# Patient Record
Sex: Male | Born: 1955
Health system: Southern US, Community
[De-identification: ages and names within clinical notes are randomized; demographics above are authoritative.]

## PROBLEM LIST (undated history)

## (undated) ENCOUNTER — Ambulatory Visit: Admission: EM | Payer: Medicare HMO | Source: Home / Self Care

## (undated) DIAGNOSIS — E119 Type 2 diabetes mellitus without complications: Secondary | ICD-10-CM

## (undated) DIAGNOSIS — Z860100 Personal history of colon polyps, unspecified: Secondary | ICD-10-CM

## (undated) DIAGNOSIS — J309 Allergic rhinitis, unspecified: Secondary | ICD-10-CM

## (undated) DIAGNOSIS — E785 Hyperlipidemia, unspecified: Secondary | ICD-10-CM

## (undated) DIAGNOSIS — G4730 Sleep apnea, unspecified: Secondary | ICD-10-CM

## (undated) DIAGNOSIS — I10 Essential (primary) hypertension: Secondary | ICD-10-CM

## (undated) DIAGNOSIS — Z87442 Personal history of urinary calculi: Secondary | ICD-10-CM

## (undated) DIAGNOSIS — S42409A Unspecified fracture of lower end of unspecified humerus, initial encounter for closed fracture: Secondary | ICD-10-CM

## (undated) HISTORY — DX: Personal history of urinary calculi: Z87.442

## (undated) HISTORY — DX: Unspecified fracture of lower end of unspecified humerus, initial encounter for closed fracture: S42.409A

## (undated) HISTORY — DX: Allergic rhinitis, unspecified: J30.9

## (undated) HISTORY — DX: Type 2 diabetes mellitus without complications: E11.9

## (undated) HISTORY — PX: OTHER SURGICAL HISTORY: SHX169

## (undated) HISTORY — PX: TONSILLECTOMY AND ADENOIDECTOMY: SUR1326

## (undated) HISTORY — PX: KIDNEY STONE SURGERY: SHX686

## (undated) HISTORY — DX: Hyperlipidemia, unspecified: E78.5

## (undated) HISTORY — DX: Essential (primary) hypertension: I10

## (undated) HISTORY — PX: CHOLECYSTECTOMY: SHX55

## (undated) HISTORY — DX: Personal history of colon polyps, unspecified: Z86.0100

## (undated) HISTORY — PX: FRACTURE SURGERY: SHX138

## (undated) HISTORY — DX: Personal history of colonic polyps: Z86.010

---

## 2000-02-22 ENCOUNTER — Encounter: Admission: RE | Admit: 2000-02-22 | Discharge: 2000-02-22 | Payer: Self-pay | Admitting: Family Medicine

## 2000-02-22 ENCOUNTER — Encounter: Payer: Self-pay | Admitting: Family Medicine

## 2000-04-30 ENCOUNTER — Ambulatory Visit (HOSPITAL_COMMUNITY): Admission: RE | Admit: 2000-04-30 | Discharge: 2000-04-30 | Payer: Self-pay | Admitting: Internal Medicine

## 2000-04-30 ENCOUNTER — Encounter: Payer: Self-pay | Admitting: Internal Medicine

## 2000-06-10 ENCOUNTER — Ambulatory Visit (HOSPITAL_BASED_OUTPATIENT_CLINIC_OR_DEPARTMENT_OTHER): Admission: RE | Admit: 2000-06-10 | Discharge: 2000-06-10 | Payer: Self-pay | Admitting: Family Medicine

## 2000-06-10 ENCOUNTER — Encounter: Payer: Self-pay | Admitting: Pulmonary Disease

## 2002-02-22 ENCOUNTER — Encounter: Payer: Self-pay | Admitting: Pulmonary Disease

## 2002-02-22 ENCOUNTER — Ambulatory Visit (HOSPITAL_BASED_OUTPATIENT_CLINIC_OR_DEPARTMENT_OTHER): Admission: RE | Admit: 2002-02-22 | Discharge: 2002-02-22 | Payer: Self-pay | Admitting: Surgical Oncology

## 2002-03-29 ENCOUNTER — Encounter: Payer: Self-pay | Admitting: Pulmonary Disease

## 2005-07-10 ENCOUNTER — Ambulatory Visit: Payer: Self-pay | Admitting: Family Medicine

## 2005-08-14 ENCOUNTER — Ambulatory Visit: Payer: Self-pay | Admitting: Family Medicine

## 2005-09-24 ENCOUNTER — Ambulatory Visit: Payer: Self-pay | Admitting: Family Medicine

## 2005-10-22 ENCOUNTER — Ambulatory Visit: Payer: Self-pay | Admitting: Pulmonary Disease

## 2006-07-23 ENCOUNTER — Ambulatory Visit: Payer: Self-pay | Admitting: Family Medicine

## 2006-10-04 ENCOUNTER — Emergency Department (HOSPITAL_COMMUNITY): Admission: EM | Admit: 2006-10-04 | Discharge: 2006-10-04 | Payer: Self-pay | Admitting: Emergency Medicine

## 2006-12-22 ENCOUNTER — Ambulatory Visit: Payer: Self-pay | Admitting: Family Medicine

## 2006-12-22 LAB — CONVERTED CEMR LAB
AST: 23 units/L (ref 0–37)
Bilirubin, Direct: 0.1 mg/dL (ref 0.0–0.3)
Direct LDL: 178.7 mg/dL
Eosinophils Relative: 4.9 % (ref 0.0–5.0)
HDL: 37.9 mg/dL — ABNORMAL LOW (ref >39.0)
Hemoglobin: 15.3 g/dL (ref 13.0–17.0)
Lymphocytes Relative: 31.9 % (ref 12.0–46.0)
MCHC: 34.6 g/dL (ref 30.0–36.0)
Neutro Abs: 3.6 10*3/uL (ref 1.4–7.7)
PSA: 1.61 ng/mL
PSA: 1.61 ng/mL (ref 0.10–4.00)
Platelets: 266 10*3/uL (ref 150–400)
Total CHOL/HDL Ratio: 6.3
Total Protein: 7 g/dL (ref 6.0–8.3)
Triglycerides: 181 mg/dL — ABNORMAL HIGH (ref 0–149)

## 2007-01-20 ENCOUNTER — Ambulatory Visit: Payer: Self-pay | Admitting: Internal Medicine

## 2007-02-02 ENCOUNTER — Encounter: Payer: Self-pay | Admitting: Internal Medicine

## 2007-02-02 ENCOUNTER — Ambulatory Visit: Payer: Self-pay | Admitting: Internal Medicine

## 2007-02-02 LAB — HM COLONOSCOPY

## 2007-03-11 ENCOUNTER — Encounter: Payer: Self-pay | Admitting: Family Medicine

## 2007-03-11 DIAGNOSIS — J309 Allergic rhinitis, unspecified: Secondary | ICD-10-CM | POA: Insufficient documentation

## 2007-03-11 DIAGNOSIS — I10 Essential (primary) hypertension: Secondary | ICD-10-CM

## 2007-03-11 DIAGNOSIS — E782 Mixed hyperlipidemia: Secondary | ICD-10-CM

## 2007-03-11 DIAGNOSIS — G4730 Sleep apnea, unspecified: Secondary | ICD-10-CM | POA: Insufficient documentation

## 2007-03-11 DIAGNOSIS — J45909 Unspecified asthma, uncomplicated: Secondary | ICD-10-CM | POA: Insufficient documentation

## 2007-03-23 ENCOUNTER — Encounter (INDEPENDENT_AMBULATORY_CARE_PROVIDER_SITE_OTHER): Payer: Self-pay | Admitting: *Deleted

## 2007-05-04 ENCOUNTER — Ambulatory Visit: Payer: Self-pay | Admitting: Family Medicine

## 2007-05-05 LAB — CONVERTED CEMR LAB
AST: 34 units/L (ref 0–37)
Albumin: 4 g/dL (ref 3.5–5.2)
BUN: 9 mg/dL (ref 6–23)
CO2: 31 meq/L (ref 19–32)
Calcium: 9 mg/dL (ref 8.4–10.5)
Cholesterol: 178 mg/dL (ref 0–200)
HDL: 32.3 mg/dL — ABNORMAL LOW (ref >39.0)
LDL Cholesterol: 123 mg/dL — ABNORMAL HIGH (ref 0–99)
Potassium: 3.7 meq/L (ref 3.5–5.1)
Total CHOL/HDL Ratio: 5.5
Triglycerides: 115 mg/dL (ref 0–149)

## 2007-05-22 ENCOUNTER — Ambulatory Visit: Payer: Self-pay | Admitting: Pulmonary Disease

## 2007-06-07 ENCOUNTER — Emergency Department (HOSPITAL_COMMUNITY): Admission: EM | Admit: 2007-06-07 | Discharge: 2007-06-07 | Payer: Self-pay | Admitting: Emergency Medicine

## 2007-06-20 ENCOUNTER — Emergency Department (HOSPITAL_COMMUNITY): Admission: EM | Admit: 2007-06-20 | Discharge: 2007-06-20 | Payer: Self-pay | Admitting: Emergency Medicine

## 2007-06-23 ENCOUNTER — Ambulatory Visit: Payer: Self-pay | Admitting: Family Medicine

## 2007-06-26 ENCOUNTER — Encounter: Admission: RE | Admit: 2007-06-26 | Discharge: 2007-06-26 | Payer: Self-pay | Admitting: Family Medicine

## 2007-07-16 ENCOUNTER — Encounter: Payer: Self-pay | Admitting: Family Medicine

## 2007-07-24 ENCOUNTER — Observation Stay (HOSPITAL_COMMUNITY): Admission: EM | Admit: 2007-07-24 | Discharge: 2007-07-25 | Payer: Self-pay | Admitting: Emergency Medicine

## 2007-07-24 ENCOUNTER — Encounter (INDEPENDENT_AMBULATORY_CARE_PROVIDER_SITE_OTHER): Payer: Self-pay | Admitting: General Surgery

## 2007-07-29 ENCOUNTER — Encounter: Payer: Self-pay | Admitting: Family Medicine

## 2008-02-02 ENCOUNTER — Encounter: Payer: Self-pay | Admitting: Internal Medicine

## 2008-05-16 ENCOUNTER — Ambulatory Visit: Payer: Self-pay | Admitting: Family Medicine

## 2008-05-16 ENCOUNTER — Telehealth: Payer: Self-pay | Admitting: Internal Medicine

## 2008-05-17 ENCOUNTER — Ambulatory Visit: Payer: Self-pay | Admitting: Gastroenterology

## 2008-06-21 ENCOUNTER — Ambulatory Visit: Payer: Self-pay | Admitting: Family Medicine

## 2008-06-26 IMAGING — CR DG ABDOMEN ACUTE W/ 1V CHEST
3 series · 3 of 3 positions shown · non-contrast
Comparison: none

CLINICAL DATA: Abdominal  pain.
 ACUTE ABDOMINAL SERIES - 3 VIEW:

[w chest pa]
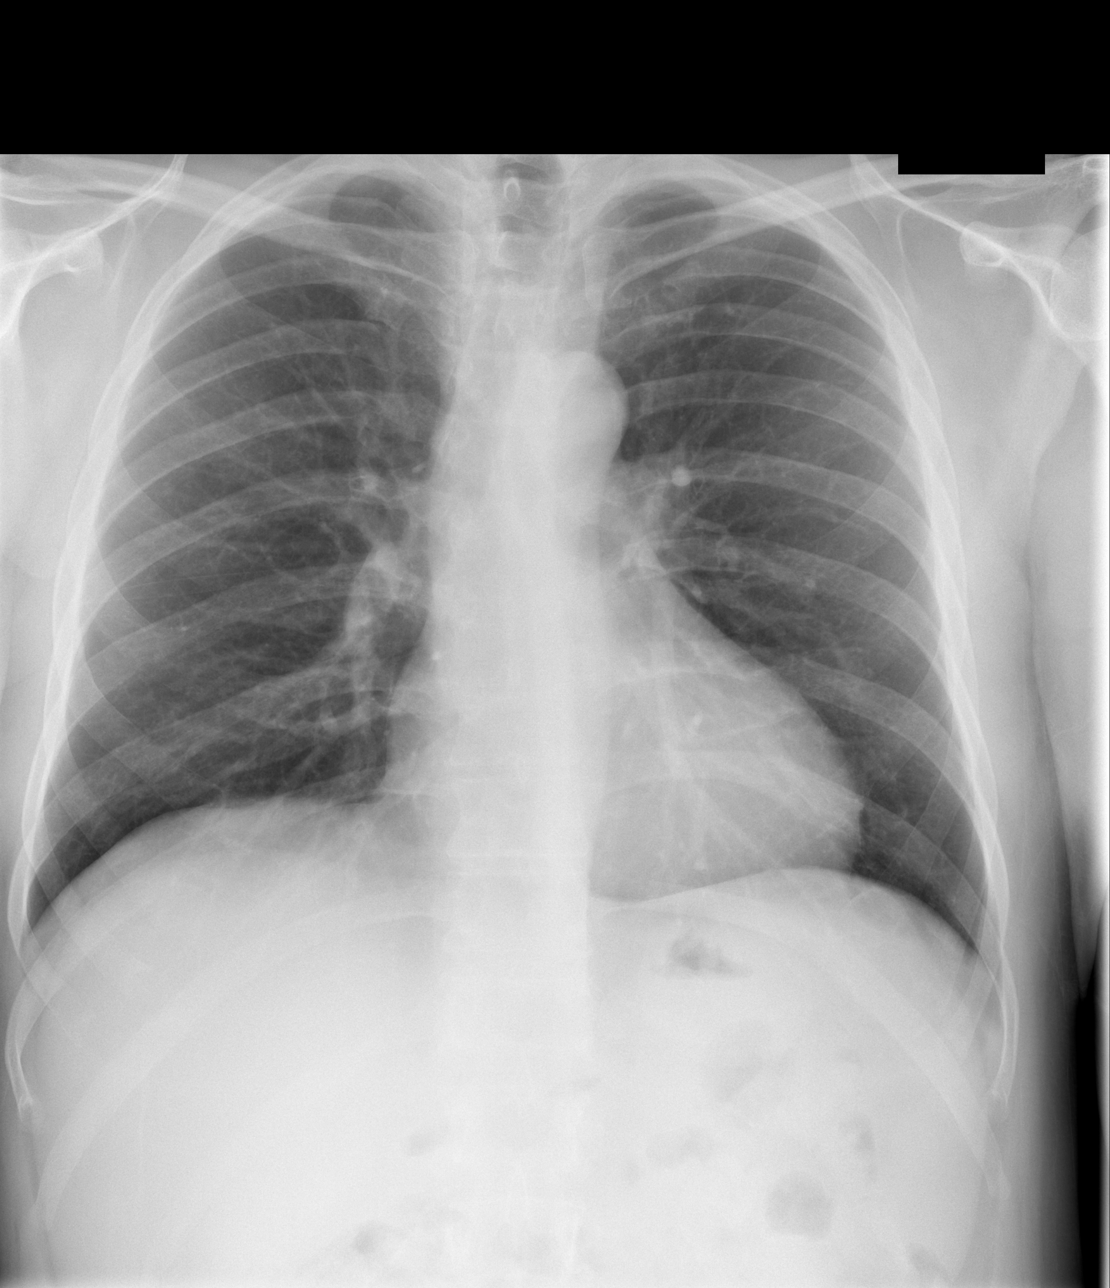

[w abdomen upright]
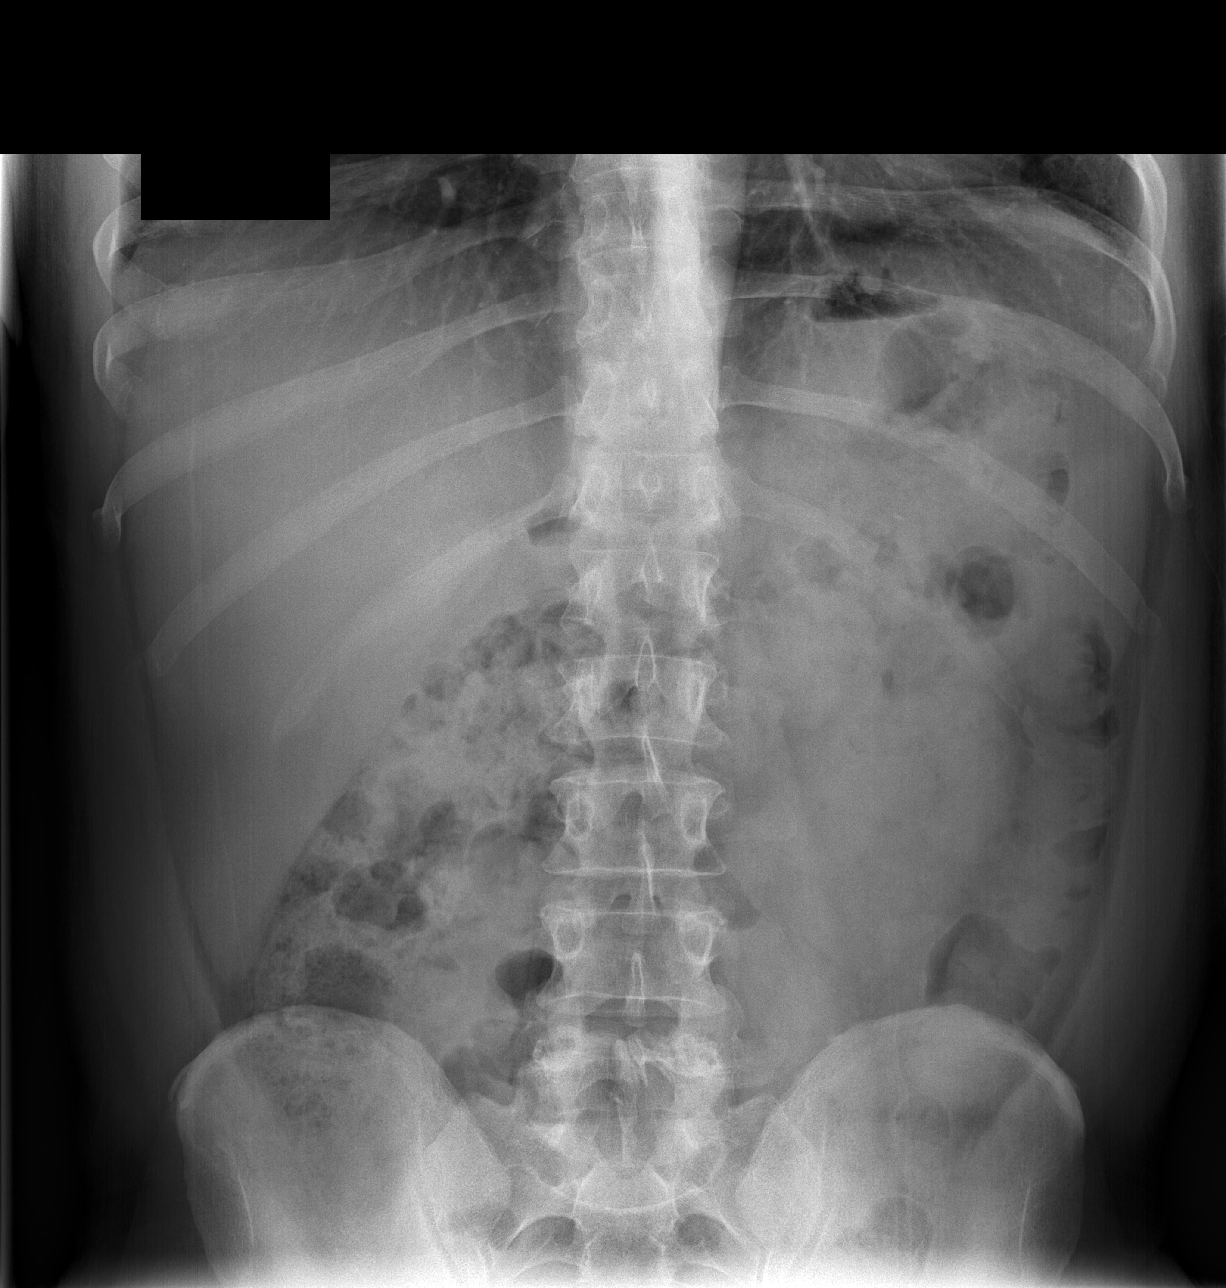

[t abdomen supine]
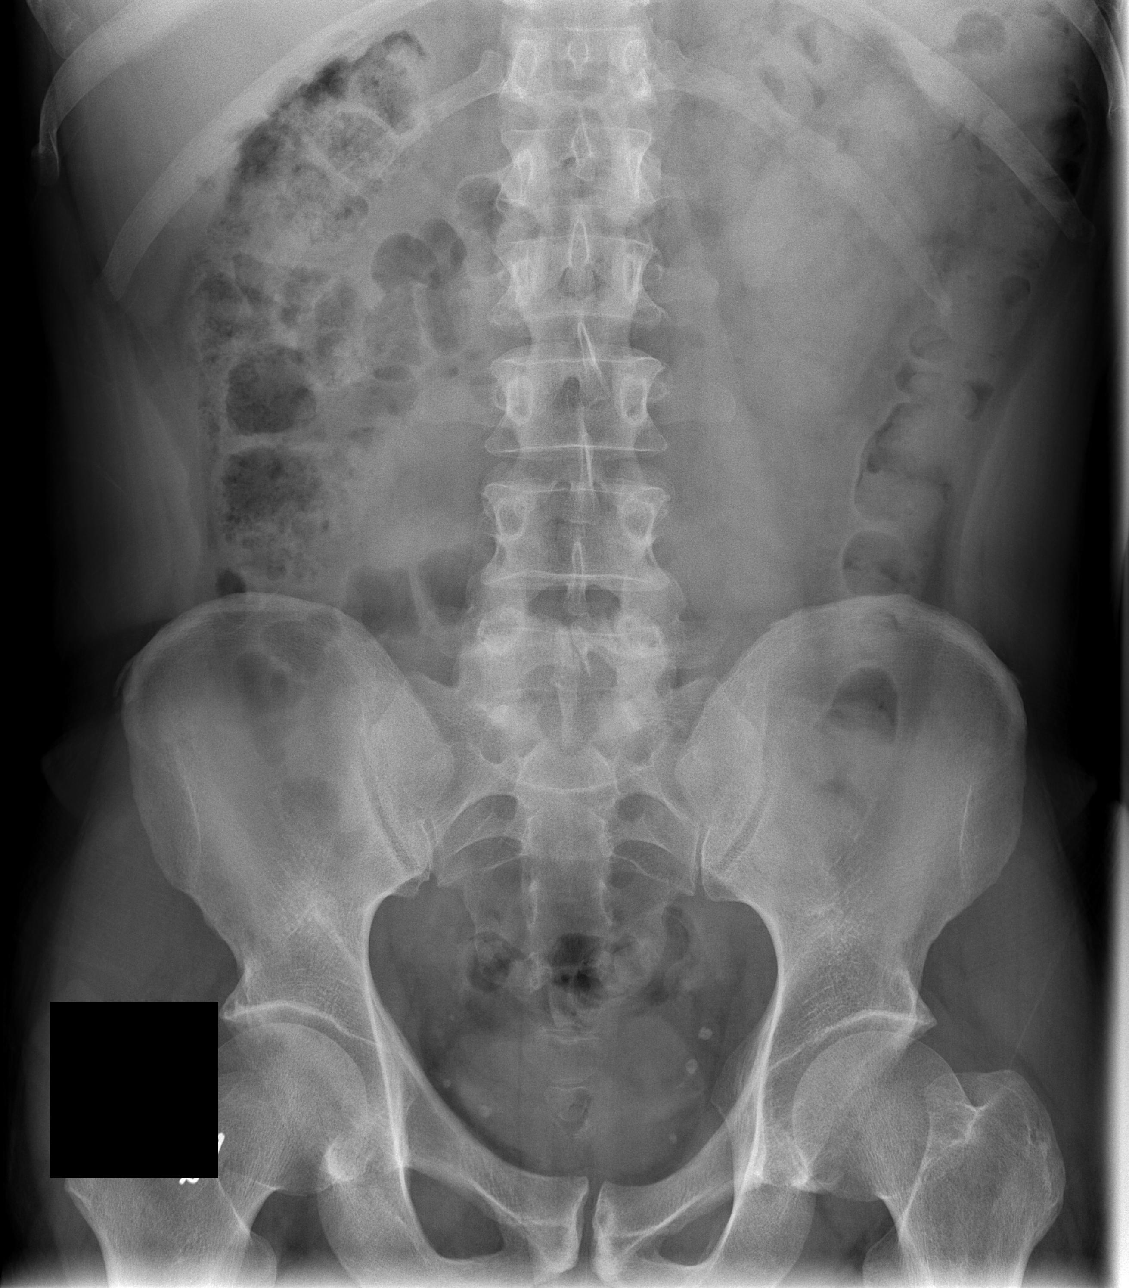

[3 of 3 positions shown; findings below may reference images not displayed]

FINDINGS: No active cardiopulmonary disease. Bowel gas pattern unremarkable. Generous amount of stool throughout the colon, mainly in the right colon. No unusual calcification or definite mass effect. Properitoneal fat stripes defined.
IMPRESSION: No acute chest or abdominal disease. Query constipation.

## 2008-10-26 ENCOUNTER — Ambulatory Visit: Payer: Self-pay | Admitting: Pulmonary Disease

## 2008-10-26 DIAGNOSIS — G4733 Obstructive sleep apnea (adult) (pediatric): Secondary | ICD-10-CM

## 2009-01-16 ENCOUNTER — Ambulatory Visit: Payer: Self-pay | Admitting: Family Medicine

## 2009-01-16 DIAGNOSIS — Z860100 Personal history of colon polyps, unspecified: Secondary | ICD-10-CM | POA: Insufficient documentation

## 2009-01-23 LAB — CONVERTED CEMR LAB
Basophils Absolute: 0.1 10*3/uL (ref 0.0–0.1)
Bilirubin, Direct: 0.1 mg/dL (ref 0.0–0.3)
CO2: 32 meq/L (ref 19–32)
Calcium: 9.1 mg/dL (ref 8.4–10.5)
Creatinine, Ser: 1 mg/dL (ref 0.4–1.5)
Direct LDL: 148.3 mg/dL
Eosinophils Absolute: 0.4 10*3/uL (ref 0.0–0.7)
Eosinophils Relative: 5.5 % — ABNORMAL HIGH (ref 0.0–5.0)
GFR calc non Af Amer: 82.93 mL/min (ref >60)
Glucose, Bld: 113 mg/dL — ABNORMAL HIGH (ref 70–99)
HDL: 32.2 mg/dL — ABNORMAL LOW (ref >39.00)
Hemoglobin: 15.1 g/dL (ref 13.0–17.0)
MCHC: 34.8 g/dL (ref 30.0–36.0)
Neutro Abs: 3.1 10*3/uL (ref 1.4–7.7)
Neutrophils Relative %: 45.5 % (ref 43.0–77.0)
Platelets: 207 10*3/uL (ref 150.0–400.0)
RBC: 4.83 M/uL (ref 4.22–5.81)
RDW: 13.3 % (ref 11.5–14.6)
TSH: 1.67 microintl units/mL (ref 0.35–5.50)
Total Protein: 7.4 g/dL (ref 6.0–8.3)
VLDL: 30 mg/dL (ref 0.0–40.0)

## 2009-08-21 ENCOUNTER — Ambulatory Visit: Payer: Self-pay | Admitting: Family Medicine

## 2009-08-21 DIAGNOSIS — R739 Hyperglycemia, unspecified: Secondary | ICD-10-CM

## 2009-09-06 ENCOUNTER — Ambulatory Visit: Payer: Self-pay | Admitting: Family Medicine

## 2009-09-06 DIAGNOSIS — H9319 Tinnitus, unspecified ear: Secondary | ICD-10-CM | POA: Insufficient documentation

## 2009-09-20 ENCOUNTER — Ambulatory Visit: Payer: Self-pay | Admitting: Family Medicine

## 2009-09-20 DIAGNOSIS — R519 Headache, unspecified: Secondary | ICD-10-CM | POA: Insufficient documentation

## 2009-09-20 DIAGNOSIS — R51 Headache: Secondary | ICD-10-CM | POA: Insufficient documentation

## 2009-09-21 ENCOUNTER — Telehealth: Payer: Self-pay | Admitting: Family Medicine

## 2009-11-02 ENCOUNTER — Ambulatory Visit: Payer: Self-pay | Admitting: Family Medicine

## 2009-11-07 ENCOUNTER — Encounter: Payer: Self-pay | Admitting: Family Medicine

## 2009-11-07 ENCOUNTER — Telehealth: Payer: Self-pay | Admitting: Family Medicine

## 2009-11-07 ENCOUNTER — Emergency Department (HOSPITAL_COMMUNITY): Admission: EM | Admit: 2009-11-07 | Discharge: 2009-11-07 | Payer: Self-pay | Admitting: Emergency Medicine

## 2009-11-09 ENCOUNTER — Ambulatory Visit: Payer: Self-pay | Admitting: Family Medicine

## 2009-11-09 DIAGNOSIS — H5710 Ocular pain, unspecified eye: Secondary | ICD-10-CM | POA: Insufficient documentation

## 2009-11-09 LAB — CONVERTED CEMR LAB
Basophils Absolute: 0 10*3/uL (ref 0.0–0.1)
Hemoglobin: 15.2 g/dL (ref 13.0–17.0)
Lymphs Abs: 3.2 10*3/uL (ref 0.7–4.0)
MCHC: 34.2 g/dL (ref 30.0–36.0)
MCV: 92.7 fL (ref 78.0–100.0)
Neutrophils Relative %: 46.7 % (ref 43.0–77.0)

## 2009-11-14 ENCOUNTER — Telehealth: Payer: Self-pay | Admitting: Family Medicine

## 2009-11-14 ENCOUNTER — Encounter: Payer: Self-pay | Admitting: Family Medicine

## 2009-11-14 HISTORY — PX: OTHER SURGICAL HISTORY: SHX169

## 2009-11-20 ENCOUNTER — Ambulatory Visit: Payer: Self-pay | Admitting: Family Medicine

## 2009-11-21 ENCOUNTER — Encounter: Payer: Self-pay | Admitting: Family Medicine

## 2009-12-01 ENCOUNTER — Encounter: Payer: Self-pay | Admitting: Family Medicine

## 2010-03-02 ENCOUNTER — Encounter: Payer: Self-pay | Admitting: Family Medicine

## 2010-04-20 ENCOUNTER — Emergency Department (HOSPITAL_COMMUNITY): Admission: EM | Admit: 2010-04-20 | Discharge: 2010-04-20 | Payer: Self-pay | Admitting: Emergency Medicine

## 2010-05-01 ENCOUNTER — Ambulatory Visit: Payer: Self-pay | Admitting: Family Medicine

## 2010-05-01 ENCOUNTER — Telehealth (INDEPENDENT_AMBULATORY_CARE_PROVIDER_SITE_OTHER): Payer: Self-pay | Admitting: *Deleted

## 2010-05-03 LAB — CONVERTED CEMR LAB
AST: 26 units/L (ref 0–37)
Albumin: 4.1 g/dL (ref 3.5–5.2)
Alkaline Phosphatase: 48 units/L (ref 39–117)
CO2: 32 meq/L (ref 19–32)
Cholesterol: 192 mg/dL (ref 0–200)
Creatinine, Ser: 1.1 mg/dL (ref 0.4–1.5)
Eosinophils Absolute: 0.5 10*3/uL (ref 0.0–0.7)
Glucose, Bld: 110 mg/dL — ABNORMAL HIGH (ref 70–99)
HCT: 42.8 % (ref 39.0–52.0)
HDL: 32.1 mg/dL — ABNORMAL LOW (ref >39.00)
Hemoglobin: 14.6 g/dL (ref 13.0–17.0)
Hgb A1c MFr Bld: 6.2 % (ref 4.6–6.5)
Lymphs Abs: 3.6 10*3/uL (ref 0.7–4.0)
MCHC: 34.2 g/dL (ref 30.0–36.0)
Monocytes Absolute: 0.9 10*3/uL (ref 0.1–1.0)
Neutro Abs: 3.4 10*3/uL (ref 1.4–7.7)
PSA: 1.99 ng/mL (ref 0.10–4.00)
Potassium: 4.6 meq/L (ref 3.5–5.1)
RBC: 4.66 M/uL (ref 4.22–5.81)
Sodium: 143 meq/L (ref 135–145)
TSH: 2.34 microintl units/mL (ref 0.35–5.50)
Total CHOL/HDL Ratio: 6
Total Protein: 7 g/dL (ref 6.0–8.3)
Triglycerides: 322 mg/dL — ABNORMAL HIGH (ref 0.0–149.0)
VLDL: 64.4 mg/dL — ABNORMAL HIGH (ref 0.0–40.0)
WBC: 8.4 10*3/uL (ref 4.5–10.5)

## 2010-05-08 ENCOUNTER — Ambulatory Visit: Payer: Self-pay | Admitting: Family Medicine

## 2010-05-08 DIAGNOSIS — K2970 Gastritis, unspecified, without bleeding: Secondary | ICD-10-CM | POA: Insufficient documentation

## 2010-05-08 DIAGNOSIS — B356 Tinea cruris: Secondary | ICD-10-CM | POA: Insufficient documentation

## 2010-05-08 DIAGNOSIS — K2990 Gastroduodenitis, unspecified, without bleeding: Secondary | ICD-10-CM

## 2010-08-06 ENCOUNTER — Encounter (INDEPENDENT_AMBULATORY_CARE_PROVIDER_SITE_OTHER): Payer: Self-pay | Admitting: *Deleted

## 2010-10-04 ENCOUNTER — Ambulatory Visit
Admission: RE | Admit: 2010-10-04 | Discharge: 2010-10-04 | Payer: Self-pay | Source: Home / Self Care | Attending: Family Medicine | Admitting: Family Medicine

## 2010-10-14 LAB — CONVERTED CEMR LAB
Basophils Absolute: 0 10*3/uL (ref 0.0–0.1)
Eosinophils Relative: 6.8 % — ABNORMAL HIGH (ref 0.0–5.0)
Lymphs Abs: 2.9 10*3/uL (ref 0.7–4.0)
MCV: 95.2 fL (ref 78.0–100.0)
Monocytes Absolute: 0.8 10*3/uL (ref 0.1–1.0)
Monocytes Relative: 9 % (ref 3.0–12.0)
Neutro Abs: 4.1 10*3/uL (ref 1.4–7.7)
Neutrophils Relative %: 49.2 % (ref 43.0–77.0)
RBC: 4.81 M/uL (ref 4.22–5.81)
WBC: 8.4 10*3/uL (ref 4.5–10.5)

## 2010-10-16 NOTE — Assessment & Plan Note (Signed)
Summary: F/U [place] ON 11/07/09/[facility]   Vital Signs:  Patient profile:   55 year old male Height:      69 inches Weight:      213.75 pounds BMI:     31.68 Temp:     98.1 degrees F oral Pulse rate:   60 / minute Pulse rhythm:   regular BP sitting:   136 / 84  (left arm) Cuff size:   large  Vitals Entered By: Lewanda Rife LPN (November 09, 2009 12:44 PM)  History of Present Illness: here for f/u of tinnitus/ black spots in vision and HTN has been seen here/ opthy and ER with nl w/u so far  is about the same  hears his heart beat in his L ear -- is pulsitile and whooshing sound that comes and goes   L eye in vision -- sees a shadow in periph vision that pulsates with his heartbeat  vision may be just a little blurry  feels pressure behind his eye  eye has been red all along  Dr Darel Hong saw him in office -- and sent to ER for CT and also called ENT  Dr Darel Hong did not know why his eye was red   this began a week ago monday  saw Dr copland  then some very mld nasal congestion - no fever / just runny and stuffy nose  no ear pain or throat pain no pain in temple or tenderness  occ some pain behind eye  - not often (worse when he sneezes )    does occ get unrelated R ear pain on occasion   bp is improved today   nl opthy eval nl CT of head  reviewed these - with ER notes today     Allergies: 1)  ! * Fish Oil 2)  * Flonase 3)  * I V Dye  Past History:  Past Medical History: Last updated: 03/11/2007 Allergic rhinitis Asthma Hyperlipidemia Hypertension  Past Surgical History: Last updated: 05/17/2008 Kidney stone retrieval Colonoscopy- mild diverticulosis, int. hemorrhoids (04/2000) Stress EKG- borderline (07/2000) Cardiolite- neg EF 58% (07/2000) ENT surgery- sleep apnea (06/2001) Bilateral tib/ fib fractures in past from fall Slip and fell off roof, dislocated left elbow (2007) Colonoscopy- polyps (01/2007) 11/08 [facility] cholecystectomy 11/08  Family  History: Last updated: 01/16/2009 Father: Lung cancer Mother: Diabetes, CVA, brain tumor - menningial Paternal grandfather pancreatic cancer Colon polyps-patient  Social History: Last updated: 01/16/2009 Marital Status: Married Children: 3 1 boy,2 girls Occupation: Education officer, environmental non smoker  Risk Factors: Caffeine Use: 2 (05/17/2008) Exercise: yes (05/17/2008)  Risk Factors: Smoking Status: quit (03/11/2007) Passive Smoke Exposure: no (05/17/2008)  Review of Systems General:  Denies chills, fatigue, fever, loss of appetite, and malaise. Eyes:  Complains of blurring, eye irritation, and red eye; denies itching and light sensitivity. ENT:  Complains of decreased hearing and postnasal drainage; denies ear discharge, earache, sinus pressure, and sore throat. CV:  Denies chest pain or discomfort, lightheadness, palpitations, and shortness of breath with exertion. Resp:  Denies cough and wheezing. GI:  Denies abdominal pain, bloody stools, and change in bowel habits. MS:  Denies joint pain, joint redness, and joint swelling. Derm:  Denies itching, lesion(s), poor wound healing, and rash. Neuro:  Complains of headaches and visual disturbances; denies difficulty with concentration, disturbances in coordination, falling down, numbness, seizures, sensation of room spinning, tingling, tremors, and weakness. Psych:  Denies anxiety and depression; no new stress . Endo:  Denies excessive thirst and excessive urination. Heme:  Denies abnormal bruising and bleeding.  Physical Exam  General:  Well-developed,well-nourished,in no acute distress; alert,appropriate and cooperative throughout examination Head:  normocephalic, atraumatic, no abnormalities observed, and no abnormalities palpated.   no sinus or temporal tenderness no orbital tenderness Eyes:  vision grossly intact, pupils equal, pupils round, and pupils reactive to light.  fundi grossly wnl no nystagmus  mild conj injection in L eye - no  discharge or change in vision no rash on face or near eyes Ears:  R ear normal and L ear normal.   Nose:  no nasal discharge.   Mouth:  pharynx pink and moist.   Neck:  supple with full rom and no masses or thyromegally, no JVD or carotid bruit  Chest Wall:  No deformities, masses, tenderness or gynecomastia noted. Lungs:  Normal respiratory effort, chest expands symmetrically. Lungs are clear to auscultation, no crackles or wheezes. Heart:  Normal rate and regular rhythm. S1 and S2 normal without gallop, murmur, click, rub or other extra sounds. Abdomen:  soft, non-tender, and normal bowel sounds.   Msk:  no acute joint changes  Extremities:  No clubbing, cyanosis, edema, or deformity noted with normal full range of motion of all joints.   Neurologic:  sensation intact to light touch, gait normal, and DTRs symmetrical and normal.   Skin:  Intact without suspicious lesions or rashes no rash or skin change on face or scalp Cervical Nodes:  No lymphadenopathy noted Psych:  normal affect, talkative and pleasant    Impression & Recommendations:  Problem # 1:  EYE PAIN (ICD-379.91) now with mild redness and irritation - intermittent vision symptoms in periphery  s/p nl eval from opthy and also CT of brain  will check cbc and ESR today (temporal arteritis unlikely but want to r/o) if neg recommend f/u with his opthy  Orders: Venipuncture (84132) TLB-CBC Platelet - w/Differential (85025-CBCD) TLB-Sedimentation Rate (ESR) (85652-ESR)  Problem # 2:  TINNITUS (ICD-388.30) Assessment: Unchanged pulsitile one ear- and neg CT  lab today  no changes on exam  if nl - will ref to ENT consider carotid dopplers - though no bruits on exam  adv to update if worse or other new symptoms Orders: Venipuncture (44010) TLB-CBC Platelet - w/Differential (85025-CBCD) TLB-Sedimentation Rate (ESR) (85652-ESR)  Complete Medication List: 1)  Norvasc 10 Mg Tabs (Amlodipine besylate) .... Take one by  mouth daily 2)  Claritin Tabs (Loratadine tabs) .... Take by mouth as directed daily 3)  Advair Diskus 100-50 Mcg/dose Misc (Fluticasone-salmeterol) .... Inhale 1 puff two times a day.  pt will need ov for additional refills 4)  Imitrex 50 Mg Tabs (Sumatriptan succinate) .Marland Kitchen.. 1 by mouth times one for headache -- can repeat dose times one in 2 hours if headache is not better 5)  Hydrochlorothiazide 12.5 Mg Tabs (Hydrochlorothiazide) .... Take 1 tab  by mouth every morning 6)  Multivitamins Tabs (Multiple vitamin) .... Take one daily 7)  Fish Oil Oil (Fish oil) .... Take one daily 8)  Vitamin B-12 500 Mcg Tabs (Cyanocobalamin) .... Take one tablet daily  Patient Instructions: 1)  labs today including sed rate and also cbc  2)  please update me if symptoms worsen or worse vision or headache  3)  I will update you when labs return -- and make a plan -- I may want to refer you to ENt or follow up with eye doctor  4)  do not start blood pressure medicine yet  Current Allergies (reviewed today): ! * FISH  OIL * FLONASE * I V DYE

## 2010-10-16 NOTE — Consult Note (Signed)
Summary: Ophthalmology-Dr. Mia Creek  Ophthalmology-Dr. Mia Creek   Imported By: Maryln Gottron 12/04/2009 12:38:29  _____________________________________________________________________  External Attachment:    Type:   Image     Comment:   External Document

## 2010-10-16 NOTE — Progress Notes (Signed)
  Phone Note Call from Patient   Caller: Patient Summary of Call: Patient just called to ask me to cancel his ENT appt that was made today with Dr Jenne Campus, B/C his Opthy Dr Darel Hong saw him today and is sending him to [facility] to have an MRI and MRA done and to see a specialist also. I cancelled the ENT and asked him to call me back if we need to reschedule this consult. Initial call taken by: Carlton Adam,  November 14, 2009 11:37 AM  Follow-up for Phone Call        thanks for the update  please send for notes from Dr Darel Hong if availible yet   Follow-up by: Judith Part MD,  November 14, 2009 1:19 PM  Additional Follow-up for Phone Call Additional follow up Details #1::        Called Dr Harrietta Guardian office and requested his office note be faxeed to you. They said he was sent to [facility] ER she did not say why, only that they would fax you the office note. Additional Follow-up by: Carlton Adam,  November 14, 2009 2:12 PM

## 2010-10-16 NOTE — Progress Notes (Signed)
----   Converted from flag ---- ---- 04/30/2010 8:29 PM, Colon Flattery Tower MD wrote: please check wellness/ lipid/psa and AIC for v70.0, prostate screen, 272 and hyperglycemia - thanks  ---- 04/30/2010 8:11 AM, Liane Comber CMA (AAMA) wrote: Peri Jefferson Morning! This pt is scheduled for cpx labs tomorrow, which labs to draw and dx codes to use? Thanks Tasha ------------------------------

## 2010-10-16 NOTE — Letter (Signed)
Summary: [facility]  [facility]   Imported By: Lanelle Bal 12/20/2009 11:38:14  _____________________________________________________________________  External Attachment:    Type:   Image     Comment:   External Document

## 2010-10-16 NOTE — Letter (Signed)
Summary: [place] No Show Letter  [place] [facility]  [address] [place], [place] [address]   Phone: [phone]  Fax: [phone]    08/06/2010 MRN: [id number]  [facility] [address] [place], [place]  [address]   Dear Jerry [name],   Our records indicate that you missed your scheduled appointment with ____Lab_________________ on ___11.21.11_________.  Please contact this office to reschedule your appointment as soon as possible.  It is important that you keep your scheduled appointments with your physician, so we can provide you the best care possible.  Please be advised that there may be a charge for "no show" appointments.    Sincerely,   [place] at [facility]

## 2010-10-16 NOTE — Progress Notes (Signed)
Summary: ER records, CT and lab reports requested  Phone Note From Other Clinic   Summary of Call: phone call from [facility] at [facility] - doctor  pt was there with black spots in vision/ ? tinnitus and hearing pulse in his ears  had eval from Dr Darel Hong - determined nl exam with proptosys in L eye so got CT scan CT of head nl  bp well controlled  bp 135/85 with nl pulse  just wanted to give me heads up on this to f/u  Follow-up for Phone Call        after being assured that pt is stable and sending home I adv ER Dr to please inst pt to call for f/u with me when able to further eval his symptoms  please call for ER records and CT when ready- thankd  Follow-up by: Judith Part MD,  November 07, 2009 4:41 PM  Additional Follow-up for Phone Call Additional follow up Details #1::        ER records and CT and lab reports are on your shelf. Thank you. Lewanda Rife LPN  November 08, 2009 8:47 AM   thanks I reviewed them -- will disc at f/u tomorrow Additional Follow-up by: Judith Part MD,  November 08, 2009 1:30 PM

## 2010-10-16 NOTE — Progress Notes (Signed)
Summary: Headache  Phone Note Call from Patient Call back at [phone]   Caller: Patient Call For: Judith Part MD Summary of Call: pt would like to know how many Imitrex can he take in a day? he took 1 today and it helped, but still have some "lingering" headache can he take another? the directions states in 2 hours, but pt would like to know how many in a day? and also if he can take ibuprofen with this? Initial call taken by: Mervin Hack CMA Duncan Dull),  September 21, 2009 11:16 AM  Follow-up for Phone Call        max of 2 pills in one day can take ibuprofen with it  Follow-up by: Judith Part MD,  September 21, 2009 12:17 PM  Additional Follow-up for Phone Call Additional follow up Details #1::        Advised pt. Additional Follow-up by: Lowella Petties CMA,  September 21, 2009 12:45 PM

## 2010-10-16 NOTE — Letter (Signed)
Summary: [facility]  [facility]   Imported By: Lanelle Bal 03/27/2010 08:39:28  _____________________________________________________________________  External Attachment:    Type:   Image     Comment:   External Document

## 2010-10-16 NOTE — Letter (Signed)
Summary: [facility]  [facility]   Imported By: Maryln Gottron 12/04/2009 12:42:01  _____________________________________________________________________  External Attachment:    Type:   Image     Comment:   External Document

## 2010-10-16 NOTE — Letter (Signed)
Summary: [facility]  [facility]   Imported By: Maryln Gottron 04/03/2010 14:46:37  _____________________________________________________________________  External Attachment:    Type:   Image     Comment:   External Document

## 2010-10-16 NOTE — Letter (Signed)
Summary: [facility]  [facility]   Imported By: Maryln Gottron 12/04/2009 12:40:21  _____________________________________________________________________  External Attachment:    Type:   Image     Comment:   External Document

## 2010-10-16 NOTE — Assessment & Plan Note (Signed)
Summary: EAR PROBLEM   Vital Signs:  Patient profile:   55 year old male Height:      69 inches Weight:      211.6 pounds BMI:     31.36 Temp:     98.2 degrees F oral Pulse rate:   64 / minute Pulse rhythm:   regular BP sitting:   140 / 72  (left arm) Cuff size:   large  Vitals Entered By: Benny Lennert CMA Duncan Dull) (November 02, 2009 4:01 PM)  Serial Vital Signs/Assessments:  Time      Position  BP       Pulse  Resp  Temp     By 4:10 PM             160/95                         Hannah Beat MD    History of Present Illness: Chief complaint ear problem  Ringing in his ears, seeing heartbeat  Ringing in his ears is not new. Seeing in his eyes on the periphery - seeing his heartbeat. Mainly sees on the outside  the patient is a pleasant gentleman he is in no distress.  He describes a sensation were on the ulcers of his peripheral vision he sees a throbbing sensation. He has not lost vision, and his vision is not blurred. He is also, primarily left-sided feeling a throbbing as well it does and rhythm with this. He also has long-standing tinnitus.   He denies any ear pain.  He has any fevers or chills.  He has the chest pain, weakness, slurred speech, balance disturbance, headaches, or any other neurological signs or symptoms.  Allergies: 1)  ! * Fish Oil 2)  * Flonase 3)  * I V Dye  Past History:  Past medical, surgical, family and social histories (including risk factors) reviewed, and no changes noted (except as noted below).  Past Medical History: Reviewed history from 03/11/2007 and no changes required. Allergic rhinitis Asthma Hyperlipidemia Hypertension  Past Surgical History: Reviewed history from 05/17/2008 and no changes required. Kidney stone retrieval Colonoscopy- mild diverticulosis, int. hemorrhoids (04/2000) Stress EKG- borderline (07/2000) Cardiolite- neg EF 58% (07/2000) ENT surgery- sleep apnea (06/2001) Bilateral tib/ fib fractures in  past from fall Slip and fell off roof, dislocated left elbow (2007) Colonoscopy- polyps (01/2007) 11/08 [facility]Y cholecystectomy 11/08  Family History: Reviewed history from 01/16/2009 and no changes required. Father: Lung cancer Mother: Diabetes, CVA, brain tumor - menningial Paternal grandfather pancreatic cancer Colon polyps-patient  Social History: Reviewed history from 01/16/2009 and no changes required. Marital Status: Married Children: 3 1 boy,2 girls Occupation: Education officer, environmental non smoker  Review of Systems General:  Denies chills, fatigue, and fever. Neuro:  Complains of visual disturbances; denies difficulty with concentration, disturbances in coordination, headaches, inability to speak, memory loss, numbness, poor balance, sensation of room spinning, tingling, tremors, and weakness.  Physical Exam  General:  Well-developed,well-nourished,in no acute distress; alert,appropriate and cooperative throughout examination Head:  Normocephalic and atraumatic without obvious abnormalities. No apparent alopecia or balding. Eyes:  No corneal or conjunctival inflammation noted. EOMI. Perrla. Funduscopic exam benign, without hemorrhages, exudates or papilledema. Vision grossly normal. Ears:  External ear exam shows no significant lesions or deformities.  Otoscopic examination reveals clear canals, tympanic membranes are intact bilaterally without bulging, retraction, inflammation or discharge. Hearing is grossly normal bilaterally. Nose:  no external deformity.   Mouth:  Oral mucosa and oropharynx  without lesions or exudates.  Teeth in good repair. Neck:  No deformities, masses, or tenderness noted. Lungs:  Normal respiratory effort, chest expands symmetrically. Lungs are clear to auscultation, no crackles or wheezes. Heart:  Normal rate and regular rhythm. S1 and S2 normal without gallop, murmur, click, rub or other extra sounds. Extremities:  No clubbing, cyanosis, edema, or deformity noted  with normal full range of motion of all joints.   Cervical Nodes:  No lymphadenopathy noted Psych:  Cognition and judgment appear intact. Alert and cooperative with normal attention span and concentration. No apparent delusions, illusions, hallucinations   Detailed Neurologic Exam  Speech:    Speech is normal; fluent and spontaneous with normal comprehension Cognition:    The patient is oriented to person, place, and time; memory intact; language fluent; normal attention, concentration, and fund of knowledge Cranial Nerves:    The pupils are equal, round, and reactive to light. The fundi are normal and spontaneous venous pulsations are present. Visual fields are full to finger confrontation. Extraocular movements are intact. Trigeminal sensation is intact and the muscles of mastication are normal. The face is symmetric. The palate elevates in the midline. Voice is normal. Shoulder shrug is normal. The tongue has normal motion without fasciculations.  Coordination:    rhomberg negative Gait:    Heel-toe and tandem gait are normal.    Impression & Recommendations:  Problem # 1:  HYPERTENSION (ICD-401.9) Assessment Deteriorated unusual presentation,  where think it  most likely is consistent with his elevated blood pressure that is 160/95 today, which sometimes can give a throbbing sensation in the ears and  in the head.  I'll add hydrochlorothiazide, and he is going to followup shortly for  recheck at complete physical Dr. Milinda Antis. No red flag neurological signs.  His updated medication list for this problem includes:    Norvasc 10 Mg Tabs (Amlodipine besylate) .Marland Kitchen... Take one by mouth daily    Hydrochlorothiazide 12.5 Mg Tabs (Hydrochlorothiazide) .Marland Kitchen... Take 1 tab  by mouth every morning  Problem # 2:  TINNITUS (ICD-388.30) the your sensations that he is hearing, primarily on the left, and cannot fully explain.  He has no effusion, no infection on the left side. He does have long-standing  tinnitus. I suspect that this is a component of elevated hypertension, but  otherwise cannot completely explain  this symptom.   I reassured them that although this was most likely a benign secondary symptom from some elevated blood pressures, and we'll treat this as such.  If he deteriorates, then he does need to followup, and I recommended that he keep his regular CPX with Dr. Milinda Antis and is seeing Dr. Shelle Iron next week.  Problem # 3:  HEADACHE (ICD-784.0) more signs as described #1 and 2  His updated medication list for this problem includes:    Imitrex 50 Mg Tabs (Sumatriptan succinate) .Marland Kitchen... 1 by mouth times one for headache -- can repeat dose times one in 2 hours if headache is not better  Complete Medication List: 1)  Norvasc 10 Mg Tabs (Amlodipine besylate) .... Take one by mouth daily 2)  Claritin Tabs (Loratadine tabs) .... Take by mouth as directed daily 3)  Advair Diskus 100-50 Mcg/dose Misc (Fluticasone-salmeterol) .... Inhale 1 puff two times a day.  pt will need ov for additional refills 4)  Imitrex 50 Mg Tabs (Sumatriptan succinate) .Marland Kitchen.. 1 by mouth times one for headache -- can repeat dose times one in 2 hours if headache is not better 5)  Hydrochlorothiazide 12.5 Mg Tabs (Hydrochlorothiazide) .... Take 1 tab  by mouth every morning Prescriptions: HYDROCHLOROTHIAZIDE 12.5 MG  TABS (HYDROCHLOROTHIAZIDE) Take 1 tab  by mouth every morning  #90 x 3   Entered and Authorized by:   Hannah Beat MD   Signed by:   Hannah Beat MD on 11/02/2009   Method used:   Electronically to        [facility]* (retail)       [address]       [address] Shipping/mailing       [place], [place]  [address]       Ph: [phone]       Fax: [phone]   RxID:   [id number]   Current Allergies (reviewed today): ! * FISH OIL * FLONASE * I V DYE

## 2010-10-16 NOTE — Assessment & Plan Note (Signed)
Summary: 9:30 APPT SINUS INFECTION/HEADACHE/[facility]   Vital Signs:  Patient profile:   55 year old male Weight:      213 pounds Temp:     97.9 degrees F oral Pulse rate:   64 / minute Pulse rhythm:   regular BP sitting:   120 / 80  (left arm) Cuff size:   large  Vitals Entered By: Lowella Petties CMA (September 20, 2009 9:45 AM) CC: Follow up with sinus infection   History of Present Illness: never got any better from prev headache/ sinusitis  finished whole course of augmentin -- and it did bother his stomach a lot   now is having chronic headache -- L side of head is full of pressure (like sinus pain) worse if he bends down or exerts himself  eye hurts to back of head  a little better this am overall  no vision problems , no aura  sometimes some photophobia - not always  no phonophobia  no n/v at all   headache has been over 4 weeks (started in sept -- after waiting in line in cold at a restaurant)   absolutely no neurol symptoms at all - no numb/weak/ vision change   caffiene - coffee one in am , and occ one in afternoon  occ soft drinks in afternoon - not daily  usually 6-8 hours of sleep at night  has been stressed lately -- just had daughter's wedding in dec and holidays   no uri symptoms or cough or fever (old drainage and cough fromallergies are constant)   no hx of migraines  no fam hx of migraine  some sinus headaches occas   uses cpap compliantly- and that is helping  mask is comfortable   Allergies: 1)  ! * Fish Oil 2)  * Flonase 3)  * I V Dye  Past History:  Past Medical History: Last updated: 03/11/2007 Allergic rhinitis Asthma Hyperlipidemia Hypertension  Past Surgical History: Last updated: 05/17/2008 Kidney stone retrieval Colonoscopy- mild diverticulosis, int. hemorrhoids (04/2000) Stress EKG- borderline (07/2000) Cardiolite- neg EF 58% (07/2000) ENT surgery- sleep apnea (06/2001) Bilateral tib/ fib fractures in past from fall Slip and  fell off roof, dislocated left elbow (2007) Colonoscopy- polyps (01/2007) 11/08 [facility] cholecystectomy 11/08  Family History: Last updated: 01/16/2009 Father: Lung cancer Mother: Diabetes, CVA, brain tumor - menningial Paternal grandfather pancreatic cancer Colon polyps-patient  Social History: Last updated: 01/16/2009 Marital Status: Married Children: 3 1 boy,2 girls Occupation: Education officer, environmental non smoker  Risk Factors: Caffeine Use: 2 (05/17/2008) Exercise: yes (05/17/2008)  Risk Factors: Smoking Status: quit (03/11/2007) Passive Smoke Exposure: no (05/17/2008)  Review of Systems General:  Denies chills, fatigue, fever, loss of appetite, malaise, weakness, and weight loss. Eyes:  Denies blurring and eye pain. CV:  Denies chest pain or discomfort, palpitations, shortness of breath with exertion, and swelling of feet. Resp:  Denies cough and wheezing. GI:  Denies abdominal pain, change in bowel habits, and indigestion. MS:  Denies joint pain, joint redness, and joint swelling. Derm:  Denies itching, lesion(s), and rash. Neuro:  Complains of difficulty with concentration and headaches; denies disturbances in coordination, falling down, numbness, poor balance, seizures, sensation of room spinning, tingling, tremors, visual disturbances, and weakness. Psych:  Denies anxiety, depression, mental problems, and panic attacks. Endo:  Denies cold intolerance, excessive thirst, excessive urination, and heat intolerance. Heme:  Denies abnormal bruising and bleeding.  Physical Exam  General:  Well-developed,well-nourished,in no acute distress; alert,appropriate and cooperative throughout examination Head:  normocephalic, atraumatic, and no abnormalities observed.  no sinus tenderness some milt L temopral tenderness Eyes:  vision grossly intact, pupils equal, pupils round, and pupils reactive to light.  fundi nl no nystagmus EOMs-I Ears:  R ear normal and L ear normal.   Nose:  no nasal  discharge.   Mouth:  pharynx pink and moist.   Neck:  supple with full rom and no masses or thyromegally, no JVD or carotid bruit  Chest Wall:  No deformities, masses, tenderness or gynecomastia noted. Lungs:  Normal respiratory effort, chest expands symmetrically. Lungs are clear to auscultation, no crackles or wheezes. Heart:  Normal rate and regular rhythm. S1 and S2 normal without gallop, murmur, click, rub or other extra sounds. Msk:  No deformity or scoliosis noted of thoracic or lumbar spine.   Extremities:  No clubbing, cyanosis, edema, or deformity noted with normal full range of motion of all joints.   Neurologic:  alert & oriented X3, cranial nerves II-XII intact, strength normal in all extremities, sensation intact to light touch, gait normal, DTRs symmetrical and normal, finger-to-nose normal, toes down bilaterally on Babinski, and Romberg negative.   Skin:  Intact without suspicious lesions or rashes Cervical Nodes:  No lymphadenopathy noted Psych:  normal affect, talkative and pleasant    Impression & Recommendations:  Problem # 1:  HEADACHE (ICD-784.0) Assessment New unhelped by tx of sinusitis some features of migraine given location also check ESR for TA will try a dose of imitrex and update long disc of headache lifestyle change consider ha clinic ref dep on results His updated medication list for this problem includes:    Imitrex 50 Mg Tabs (Sumatriptan succinate) .Marland Kitchen... 1 by mouth times one for headache -- can repeat dose times one in 2 hours if headache is not better  Orders: Venipuncture (81017) TLB-Sedimentation Rate (ESR) (85652-ESR) TLB-CBC Platelet - w/Differential (85025-CBCD)  Complete Medication List: 1)  Norvasc 10 Mg Tabs (Amlodipine besylate) .... Take one by mouth daily 2)  Claritin Tabs (Loratadine tabs) .... Take by mouth as directed daily 3)  Advair Diskus 100-50 Mcg/dose Misc (Fluticasone-salmeterol) .... Inhale 1 puff two times a day.  pt will  need ov for additional refills 4)  Imitrex 50 Mg Tabs (Sumatriptan succinate) .Marland Kitchen.. 1 by mouth times one for headache -- can repeat dose times one in 2 hours if headache is not better  Patient Instructions: 1)  try the imitrex as directed- update me if this helps headache (or if any side effects)  2)  drink lots of fluids and minimize caffiene 3)  lab today- will update you - then make a plan  Prescriptions: IMITREX 50 MG TABS (SUMATRIPTAN SUCCINATE) 1 by mouth times one for headache -- can repeat dose times one in 2 hours if headache is not better  #9 x 0   Entered and Authorized by:   Judith Part MD   Signed by:   Judith Part MD on 09/20/2009   Method used:   Print then Give to Patient   RxID:   [id number]   Prior Medications (reviewed today): NORVASC 10 MG  TABS (AMLODIPINE BESYLATE) take one by mouth daily CLARITIN   TABS (LORATADINE TABS) take by mouth as directed daily ADVAIR DISKUS 100-50 MCG/DOSE MISC (FLUTICASONE-SALMETEROL) Inhale 1 puff two times a day.  Pt will need ov for additional refills Current Allergies: ! * FISH OIL * FLONASE * I V DYE

## 2010-10-16 NOTE — Assessment & Plan Note (Signed)
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 -- and is watching the vessels carefully   otherwise feeling pretty good   thinks he has an ulcer -- hurts in his upper abdomen - and gas pain up into his shoulder  no dark stool  tends to have pain in the am  takes otc pepcid -- this helps but not completely resolved    wt is down 4 lb - bmi is 31  HTN stable 138/84 today  lipids are imp with LDL of 115 (better)- but trig high at 322- poss due to sugar is watcing diet some --though summertime is difficult with camps and cookouts frequently  is taking some fish oil - thinks that is helping  is gradually getting back to exercise   is hyperglycemic with AIC of 6.2 does not eat a lot of sweets , but eats too much starch -- bread and potato  TD 03 ptx 06  psa 1.99 - (from 1.59) no prostate problems - no nocturia   some jock itch-- otc does not work   colonosc polyps 08-- re check 5 y    Allergies: 1)  ! 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 Eyes:  Denies blurring, discharge, and eye irritation. CV:  Denies chest pain or discomfort and palpitations. Resp:  Denies cough, shortness of breath, and wheezing. GI:  Complains of abdominal pain; denies bloody stools and dark tarry stools. GU:  Denies dysuria, hematuria, urinary frequency, and urinary hesitancy. MS:  Denies muscle aches, cramps, and muscle weakness. Derm:  Complains of itching and rash. Neuro:  Denies memory loss, numbness, tingling, visual disturbances, and weakness. Psych:  Denies anxiety and depression. Endo:  Denies excessive thirst and excessive urination. Heme:  Denies abnormal bruising and bleeding.  Physical Exam  General:  overweight but generally well appearing  Head:  normocephalic, atraumatic, and no abnormalities observed.   Eyes:  vision grossly intact, pupils equal, pupils round, and pupils reactive to light.  no conjunctival pallor, injection or icterus  Ears:  R ear normal and L ear normal.   Nose:  no nasal discharge.   Mouth:  pharynx pink and moist.   Neck:  supple with full rom and no masses or thyromegally, no JVD or carotid bruit  Chest Wall:  No  deformities, masses, tenderness or gynecomastia noted. Lungs:  Normal respiratory effort, chest expands symmetrically. Lungs are clear to auscultation, no crackles or wheezes. Heart:  Normal rate and regular rhythm. S1 and S2 normal without gallop, murmur, click, rub or other extra sounds. Abdomen:  mild epigastric tenderness without rebound or gaurding  no distention, no masses, no hepatomegaly, and no splenomegaly.   Rectal:  No external abnormalities noted. Normal sphincter tone. No rectal masses or tenderness. Genitalia:  Testes bilaterally descended without nodularity, tenderness or masses. No scrotal masses or lesions. No penis lesions or urethral discharge. Prostate:  Prostate gland firm and smooth, no enlargement, nodularity, tenderness, mass, asymmetry or induration. Msk:  No deformity or scoliosis noted of thoracic or lumbar spine.   Pulses:  R and L carotid,radial,femoral,dorsalis pedis and posterior tibial pulses are full and equal bilaterally Extremities:  No clubbing, cyanosis, edema, or deformity noted with normal full range of motion of all joints.   Neurologic:  sensation intact to light touch, gait normal, and DTRs symmetrical and normal.   Skin:  erythemaous rash in pelvic area with sharp demarcation and some satellite lesions- resemblig yeast  Cervical Nodes:  No lymphadenopathy noted Inguinal Nodes:  No significant adenopathy Psych:  normal affect, talkative and pleasant    Impression & Recommendations:  Problem # 1:  HYPERGLYCEMIA, MILD (ICD-790.29) Assessment Unchanged  revewied low glycemic diet to follow and what to avoid  enc exercise / wt loss  lab and f/u 3 mo   Labs Reviewed: Creat: 1.1 (05/01/2010)     Problem # 2:  SPECIAL SCREENING MALIGNANT NEOPLASM OF PROSTATE (ICD-V76.44) Assessment: Comment Only dre is stable - no symptoms nl psa  will continue to monitor  Problem # 3:  HEALTH MAINTENANCE EXAM (ICD-V70.0) Assessment: Comment Only reviewed  health habits including diet, exercise and skin cancer prevention reviewed health maintenance list and family history rev labs in detail   Problem # 4:  HYPERTENSION (ICD-401.9) Assessment: Unchanged  is in good control with current meds - no change disc healthy diet and exercise  The following medications were removed from the medication list:    Hydrochlorothiazide 12.5 Mg Tabs (Hydrochlorothiazide) .Marland Kitchen... Take 1 tab  by mouth every morning His updated medication list for this problem includes:    Norvasc 10 Mg Tabs (Amlodipine besylate) .Marland Kitchen... Take one by mouth daily  BP today: 138/84 Prior BP: 136/84 (11/09/2009)  Labs Reviewed: K+: 4.6 (05/01/2010) Creat: : 1.1 (05/01/2010)   Chol: 192 (05/01/2010)   HDL: 32.10 (05/01/2010)   LDL: 123 (05/04/2007)   TG: 322.0 (05/01/2010)  Problem # 5:  HYPERLIPIDEMIA (ICD-272.4) Assessment: Unchanged fair control with good diet high trig may be due to hypergylcemia will re check 3 mo after better diet  Labs Reviewed: SGOT: 26 (05/01/2010)   SGPT: 44 (05/01/2010)   HDL:32.10 (05/01/2010), 32.20 (01/16/2009)  LDL:123 (05/04/2007), DEL (12/22/2006)  Chol:192 (05/01/2010), 214 (01/16/2009)  Trig:322.0 (05/01/2010), 150.0 (01/16/2009)  Problem # 6:  TINEA CRURIS (ICD-110.3) Assessment: New  will try lotrisone and update disc imp of keeping dry / even using hairdryer on cool setting to dry pelvic area   Orders: Prescription Created Electronically [phone])  Problem # 7:  GASTRITIS (ICD-535.50) Assessment: New  this is new disc diet and imp of avoiding nsaid change H2 blocker (not helping ) for omeprazole and update f/u 3 mo  His updated medication list for this problem includes:    Omeprazole 20 Mg Cpdr (Omeprazole) .Marland Kitchen... 1 by mouth once daily  Orders: Prescription Created Electronically [phone])  Complete Medication List: 1)  Norvasc 10 Mg Tabs (Amlodipine besylate) .... Take one by mouth daily 2)  Claritin Tabs (Loratadine tabs)  .... Take by mouth as directed daily 3)  Advair Diskus 100-50 Mcg/dose Misc (Fluticasone-salmeterol) .... Inhale 1 puff two times a day.  pt will need ov for additional refills 4)  Multivitamins Tabs (Multiple vitamin) .... Take one daily 5)  Fish Oil Oil (Fish oil) .... Take one daily 6)  Vitamin B-12 500 Mcg Tabs (Cyanocobalamin) .... Take one tablet daily 7)  Vitamin D3 ?iu  .... Take 1 tablet by mouth once a day 8)  Lotrisone 1-0.05 % Lotn (Clotrimazole-betamethasone) .... Apply to affected area once daily as needed 9)  Omeprazole 20 Mg Cpdr (Omeprazole) .Marland Kitchen.. 1 by mouth once daily  Patient Instructions: 1)  stop the pepcid and start omeprazole 20 mg daily in am for stomach pain 2)  update me if this does not resolve symptoms  3)  use the lotrisone for the yeast rash-- also update me if that does not help  4)  work on low sugar/ starch diet  5)  schedule fasting lab and then f/u in 3 mo lipid/ast/alt/AIC 272 and hyperglycemia  Prescriptions: OMEPRAZOLE 20 MG CPDR (OMEPRAZOLE) 1 by mouth once daily  #30 x 11   Entered and Authorized by:   Judith Part MD   Signed by:   Judith Part MD on 05/08/2010   Method used:   Electronically to        [facility]* (retail)       [address]       [address] Shipping/mailing       [place], [place]  [address]       Ph: [phone]       Fax: [phone]   RxID:   [id number] LOTRISONE 1-0.05 % LOTN (CLOTRIMAZOLE-BETAMETHASONE) apply to affected area once daily as needed  #1 medium x 1   Entered and Authorized by:   Judith Part MD   Signed by:   Judith Part MD on 05/08/2010   Method used:   Electronically to        [facility]* (retail)       [address]       [address] [place] [address]       [place], [place]  [address]       Ph: [phone]       Fax: [phone]   RxID:   [id number]   Current Allergies (reviewed today): ! * FISH OIL * FLONASE * I V DYE

## 2010-10-18 NOTE — Assessment & Plan Note (Signed)
Summary: cough, congestion, sore throat/alc   Vital Signs:  Patient profile:   55 year old male Height:      69 inches Weight:      212.50 pounds BMI:     31.49 Temp:     98.1 degrees F oral Pulse rate:   84 / minute Pulse rhythm:   regular BP sitting:   134 / 80  (left arm) Cuff size:   large  Vitals Entered By: Delilah Shan CMA Duncan Dull) (October 04, 2010 8:47 AM) CC: Cough, congestion, Back Pain   History of Present Illness: Voice change, ST, cough.  Sx started Sunday.  Sx getting worse- the soreness in chest.  Unknown sick contacts.  Using advair, some increase in wheeze noted.  cough increased at night.  Occ sputum.  +Ear pain. some rhinorrhea.  Some postnasal gtt.  Doesn't have SABA to use.  No fevers.     Allergies: 1)  ! * Fish Oil 2)  * Flonase 3)  * I V Dye  Past History:  Past Medical History: Last updated: 05/08/2010 Allergic rhinitis Asthma Hyperlipidemia Hypertension cavernous carotid fistula - with surgery  Review of Systems       See HPI.  Otherwise negative.    Physical Exam  General:  GEN: nad, alert and oriented HEENT: mucous membranes moist, TM w/o erythema, nasal epithelium injected, OP with cobblestoning NECK: supple w/o LA CV: rrr. PULM: ctab, no inc wob, no wheeze. dry cough noted ABD: soft, +bs EXT: no edema    Impression & Recommendations:  Problem # 1:  COUGH (ICD-786.2) I would use the SABA for now for cough.  He is nontoxic and okay for outpatient follow up.  Continue advair.  If not improving, then I would start the antibiotics- I expect him to resolve w/o needing this.  Supportive tx o/w.  He agrees.  follow up as needed.  Orders: Prescription Created Electronically [phone])  Complete Medication List: 1)  Norvasc 10 Mg Tabs (Amlodipine besylate) .... Take one by mouth daily 2)  Claritin Tabs (Loratadine tabs) .... Take by mouth as directed daily 3)  Advair Diskus 100-50 Mcg/dose Misc (Fluticasone-salmeterol) .... Inhale 1 puff  two times a day. 4)  Multivitamins Tabs (Multiple vitamin) .... Take one daily 5)  Fish Oil Oil (Fish oil) .... Take one daily 6)  Vitamin B-12 500 Mcg Tabs (Cyanocobalamin) .... Take one tablet daily 7)  Vitamin D3 ?iu  .... Take 1 tablet by mouth once a day 8)  Lotrisone 1-0.05 % Lotn (Clotrimazole-betamethasone) .... Apply to affected area once daily as needed 9)  Omeprazole 20 Mg Cpdr (Omeprazole) .Marland Kitchen.. 1 by mouth once daily 10)  Ventolin Hfa 108 (90 Base) Mcg/act Aers (Albuterol sulfate) .... 2 puffs every 4 hours as needed for cough 11)  Zithromax 250 Mg Tabs (Azithromycin) .... 2 by mouth for 1 day and then 1 by mouth once daily for 4 days  Patient Instructions: 1)  Keep using the advair two times a day, use the albuterol every 4 hours as needed, and start the antibiotics in a few days if you haven't started to get better.  Take care.  Let us know if you aren't improving.  Prescriptions: ZITHROMAX 250 MG TABS (AZITHROMYCIN) 2 by mouth for 1 day and then 1 by mouth once daily for 4 days  #6 x 0   Entered and Authorized by:   Crawford Givens MD   Signed by:   Crawford Givens MD on 10/04/2010   Method  used:   Print then Give to Patient   RxID:   [id number] ADVAIR DISKUS 100-50 MCG/DOSE MISC (FLUTICASONE-SALMETEROL) Inhale 1 puff two times a day.  #3 x 3   Entered and Authorized by:   Crawford Givens MD   Signed by:   Crawford Givens MD on 10/04/2010   Method used:   Electronically to        [facility]* (retail)       [address]       [address] Shipping/mailing       [place], [place]  [address]       Ph: [phone]       Fax: [phone]   RxID:   [id number] VENTOLIN HFA 108 (90 BASE) MCG/ACT AERS (ALBUTEROL SULFATE) 2 puffs every 4 hours as needed for cough  #1 x 3   Entered and Authorized by:   Crawford Givens MD   Signed by:   Crawford Givens MD on 10/04/2010   Method used:   Electronically to        [facility]* (retail)       [address]       [place], [place]  [address]       Ph: [phone]       Fax: [phone]   RxID:   [id number]    Orders Added: 1)  Est. Patient Level III [32355] 2)  Prescription Created Electronically [phone]    Current Allergies (reviewed today): ! * FISH OIL * FLONASE * I V DYE

## 2010-11-30 LAB — POCT I-STAT, CHEM 8
Creatinine, Ser: 1.1 mg/dL (ref 0.4–1.5)
HCT: 44 % (ref 39.0–52.0)
Potassium: 3.4 mEq/L — ABNORMAL LOW (ref 3.5–5.1)
Sodium: 140 mEq/L (ref 135–145)
TCO2: 26 mmol/L (ref 0–100)

## 2010-11-30 LAB — DIFFERENTIAL
Basophils Absolute: 0 10*3/uL (ref 0.0–0.1)
Basophils Relative: 1 % (ref 0–1)
Eosinophils Absolute: 0.1 10*3/uL (ref 0.0–0.7)
Monocytes Relative: 12 % (ref 3–12)
Neutro Abs: 3.9 10*3/uL (ref 1.7–7.7)
Neutrophils Relative %: 69 % (ref 43–77)

## 2010-11-30 LAB — CBC
HCT: 41 % (ref 39.0–52.0)
Hemoglobin: 14.8 g/dL (ref 13.0–17.0)
MCH: 31 pg (ref 26.0–34.0)
MCHC: 36.1 g/dL — ABNORMAL HIGH (ref 30.0–36.0)
MCV: 86 fL (ref 78.0–100.0)
Platelets: 137 10*3/uL — ABNORMAL LOW (ref 150–400)
RBC: 4.77 MIL/uL (ref 4.22–5.81)
RDW: 13.3 % (ref 11.5–15.5)
WBC: 5.7 10*3/uL (ref 4.0–10.5)

## 2010-11-30 LAB — COMPREHENSIVE METABOLIC PANEL
ALT: 58 U/L — ABNORMAL HIGH (ref 0–53)
AST: 44 U/L — ABNORMAL HIGH (ref 0–37)
Total Bilirubin: 0.9 mg/dL (ref 0.3–1.2)

## 2010-11-30 LAB — ROCKY MTN SPOTTED FVR AB, IGG-BLOOD: RMSF IgG: 0.22 IV

## 2011-01-29 NOTE — Op Note (Signed)
Jerry [name], Jerry [name]              ACCOUNT NO.:  [id number]   MEDICAL RECORD NO.:  [id number]          PATIENT TYPE:  INP   LOCATION:  5733                         FACILITY:  [facility]   PHYSICIAN:  Angelia Mould. Derrell Lolling, M.D.DATE OF BIRTH:  [birth year]   DATE OF PROCEDURE:  07/24/2007  DATE OF DISCHARGE:  07/25/2007                               OPERATIVE REPORT   PREOPERATIVE DIAGNOSIS:  Acute cholecystitis with cholelithiasis   POSTOPERATIVE DIAGNOSIS:  Acute cholecystitis with cholelithiasis   OPERATION PERFORMED:  Laparoscopic cholecystectomy with intraoperative  cholangiogram.   SURGEON:  Angelia Mould. Derrell Lolling, M.D.   FIRST ASSISTANT:  Anselm Pancoast. Zachery Dakins, M.D.   OPERATIVE INDICATIONS:  This is a 55 year old white male who presented  with right upper quadrant pain, epigastric pain, nausea and vomiting,  moderately severe.  In the emergency room, he was found to have a normal  liver function test and ultrasound which showed gallstones.  The bile  duct was not dilated.  Liver function tests were not abnormal.  His pain  subsided somewhat in the emergency room.  He was admitted for management  of his cholecystitis.   OPERATIVE FINDINGS:  The gallbladder was edematous and acutely inflamed,  but there was no evidence of gangrene, there was no exudate.  The liver  looked normal.  The cholangiogram showed normal intrahepatic and  extrahepatic bile ducts, no filling defects, and no obstruction with  good flow of contrast into the duodenum.  The small intestine, large  intestine, stomach and duodenum were all normal to inspection.   OPERATIVE TECHNIQUE:  Following the induction of general endotracheal  anesthesia, the patient's abdomen was prepped and draped in a sterile  fashion.  The patient was identified as to correct patient and correct  procedure.  0.5% Marcaine with epinephrine was used for local  infiltration anesthetic.  A transverse incision was made at the superior  rim of the  umbilicus.  The fascia was incised in the midline and the  abdominal cavity entered under direct vision.  A 10 mm Hassan trocar was  inserted and secured with a pursestring suture of 0 Vicryl.  Pneumoperitoneum was created.  The video camera was inserted with  visualization and findings as described above.  A 10 mm trocar was  placed in the subxiphoid region and two 5 mm trocars were placed in the  right upper quadrant.  The gallbladder fundus was grasped and elevated.  The infundibulum was retracted laterally.  I dissected out the cystic  duct and cystic artery.  I actually found the anterior branch and the  posterior branch of the cystic artery.  These were isolated, secured  with multiple metal clips, and divided separately.  This created a large  window behind the cystic duct.  A cholangiogram catheter was inserted in  the cystic duct.  A cholangiogram was obtained using the C-arm.  The  cholangiogram was normal as described above.  The catheter was removed,  the cystic duct secured with multiple metal clips, and divided.  The  gallbladder was dissected from its bed with electrocautery, placed in a  specimen bag, and  removed.  The operative field was copiously irrigated  with saline.  Hemostasis was excellent and achieved with electrocautery.  AT the end of the procedure, the irrigation fluid was completely clear  and there was no bleeding or bile leak, whatsoever.  The trocars were  removed under direct vision and it was seen that there was no bleeding  from the trocar sites.  The pneumoperitoneum was released.  The fascia  at the umbilicus was closed with 0 Vicryl sutures  and the skin closed with subcuticular sutures of 4-0 Monocryl and  Dermabond.  Clean bandages were placed and the patient was taken to the  recovery room in stable condition.  Estimated blood loss was about 10  mL.  Complications were none. Sponge, needle, and instrument counts were  correct.      Angelia Mould.  Derrell Lolling, M.D.  Electronically Signed     HMI/MEDQ  D:  07/24/2007  T:  07/25/2007  Job:  [id number]

## 2011-01-29 NOTE — H&P (Signed)
Jerry [name], Jerry [name]              ACCOUNT NO.:  [id number]   MEDICAL RECORD NO.:  [id number]          PATIENT TYPE:  INP   LOCATION:  5733                         FACILITY:  [facility]   PHYSICIAN:  Cherylynn Ridges, M.D.    DATE OF BIRTH:  30-Apr-1956   DATE OF ADMISSION:  07/24/2007  DATE OF DISCHARGE:                              HISTORY & PHYSICAL   IDENTIFICATION/CHIEF COMPLAINT:  The patient is a 55 year old with known  gallstones who comes in with severe right upper quadrant pain, nausea,  vomiting and low-grade fever possibly and an ultrasound demonstrating a  likely acute cholecystitis.   HISTORY OF PRESENT ILLNESS:  The patient has known about his gallbladder  disease for several weeks and actually was evaluated by a surgeon in the  past who felt as though his symptoms were inconsistent with severe  gallbladder disease.  This represents his 5th attack with this 1 being  the worst of them all, bringing him into the emergency room with severe  10/10 pain.  He had nausea, vomiting with this and possible fevers.  He  was afebrile in the emergency department, but says this is the worst  attack that he has.  He is otherwise healthy with the exception of  hypertension and an ultrasound demonstrated thickened gallbladder wall.  A surgical consultation was obtained.   PAST MEDICAL HISTORY:  Significant for:  1. Hypertension which is well controlled.  2. Seasonal allergies.   CURRENT MEDICATIONS:  1. Norvasc 10 mg a day.  2. Claritin as needed for allergies.   ALLERGIES:  HE HAS NO KNOWN DRUG ALLERGIES.   SOCIAL HISTORY:  He is a nonsmoker, nondrinker. Employed.  He is a  Education officer, environmental.   REVIEW OF SYSTEMS:  Most of his symptoms have come post prandially.  The  initial attacks came after he had some barbecue and this one came after  he had eaten a fairly good meal, chicken marsalla at [employer] about  4 hours later.  No diarrhea or constipation.  He has never been  jaundiced.   PHYSICAL EXAMINATION:  VITAL SIGNS:  He is afebrile, at 98.3, pulse 57,  blood pressure 120/70.  His respirations is 20.  HEENT:  He is normocephalic and atraumatic and anicteric.  NECK:  Supple.  No palpable masses.  He has no carotid bruits.  LUNGS:  Clear to auscultation and percussion.  CARDIAC:  Regular rhythm and rate with no murmurs.  ABDOMEN:  Flat, slightly hypoactive bowel sounds.  No palpable  gallbladder but tender in the right upper quadrant in the epigastrium  which is characterized as being mild.  RECTAL:  Was not performed.  NEUROLOGIC:  Deep tendon reflexes are symmetrical bilaterally.  He has  no weakness.  Cranial nerves II-XII are grossly intact. Orientation is  good times 3.   LABORATORY DATA:  He has a normal white count of 8.9000, hemoglobin  15.6, hematocrit 46.  Electrolytes are within normal limits.  He is not  acidotic.  Amylase and lipase are normal.  His liver function tests are  normal.   IMPRESSION:  The patient has  had multiple attacks of cute cholecystitis  and/or biliary colic, this one being the worst and he continues to have  symptoms related to that.  I am reluctant to send the patient home who  is fearful that he will have another attack and is still currently  symptomatic with tenderness in the right upper quadrant.  I will admit  him to the [facility], start him on IV antibiotics  by Unasyn 3 g q.8h. and the place him on the schedule for laparoscopic  cholecystectomy on the service to be done as soon as time is available.  Hopefully that will be later on today.  I discussed this with the  service coming on in the morning.      Cherylynn Ridges, M.D.  Electronically Signed     JOW/MEDQ  D:  07/24/2007  T:  07/24/2007  Job:  [id number]   cc:   Marne A. Milinda Antis, MD  Lennie Muckle, MD

## 2011-02-05 ENCOUNTER — Encounter: Payer: Self-pay | Admitting: Pulmonary Disease

## 2011-02-14 ENCOUNTER — Ambulatory Visit (INDEPENDENT_AMBULATORY_CARE_PROVIDER_SITE_OTHER): Payer: 59 | Admitting: Pulmonary Disease

## 2011-02-14 ENCOUNTER — Encounter: Payer: Self-pay | Admitting: Pulmonary Disease

## 2011-02-14 VITALS — BP 124/80 | HR 62 | Temp 97.9°F | Ht 70.0 in | Wt 208.0 lb

## 2011-02-14 DIAGNOSIS — G4733 Obstructive sleep apnea (adult) (pediatric): Secondary | ICD-10-CM

## 2011-02-14 NOTE — Progress Notes (Signed)
  Subjective:    Patient ID: Jerry [name], male    DOB: 1956-08-31, 55 y.o.   MRN: [id number]  HPI The pt comes in today for f/u of his known osa.  He has not been in to see me in 2 yrs, but has been wearing cpap compliantly with good response.  Most recently however, he has been opening his mouth with his nasal mask, and is not getting adequate pressure.  He is not sleeping quite as well, and has a little more sleepiness during the day.  His machine is getting aged, and may be due for a replacement.    Review of Systems  Constitutional: Negative for fever and unexpected weight change.  HENT: Positive for congestion and rhinorrhea. Negative for ear pain, nosebleeds, sore throat, sneezing, trouble swallowing, dental problem, postnasal drip and sinus pressure.   Eyes: Negative for redness and itching.  Respiratory: Negative for cough, chest tightness, shortness of breath and wheezing.   Cardiovascular: Negative for palpitations and leg swelling.  Gastrointestinal: Negative for nausea and vomiting.  Genitourinary: Negative for dysuria.  Musculoskeletal: Negative for joint swelling.  Skin: Negative for rash.  Neurological: Negative for headaches.  Hematological: Does not bruise/bleed easily.  Psychiatric/Behavioral: Negative for dysphoric mood. The patient is not nervous/anxious.        Objective:   Physical Exam Ow male in nad No skin breakdown or pressure necrosis from cpap mask No purulence or discharge noted from nares LE with no edema, no cyanosis  Alert, does not appear sleepy, moves all 4        Assessment & Plan:

## 2011-02-14 NOTE — Patient Instructions (Signed)
Will refer you to your dme to get a full face mask, and also to have them check the functioning of your machine Work on weight reduction followup with me in one year.

## 2011-02-14 NOTE — Assessment & Plan Note (Signed)
The pt had done well with cpap, but now is having issues with keeping his mouth closed with his nasal mask.  He feels he is not getting enough pressure, and that he is not sleeping as well.  Will change him over to a full face mask, and will also have dme check his machine since it is getting to the end of its life expectancy.  I have also encouraged him to work on weight loss.

## 2011-02-22 ENCOUNTER — Emergency Department: Payer: Self-pay | Admitting: Emergency Medicine

## 2011-03-05 ENCOUNTER — Other Ambulatory Visit: Payer: Self-pay | Admitting: Family Medicine

## 2011-05-29 ENCOUNTER — Other Ambulatory Visit: Payer: Self-pay | Admitting: Family Medicine

## 2011-05-29 NOTE — Telephone Encounter (Signed)
[facility] request refill Omeprazole 20 mg #30 x 1 with note pt needs to call for appt.

## 2011-06-06 ENCOUNTER — Other Ambulatory Visit: Payer: Self-pay | Admitting: Family Medicine

## 2011-06-06 MED ORDER — AMLODIPINE BESYLATE 10 MG PO TABS: 10.0000 mg | ORAL_TABLET | Freq: Every day | ORAL | Status: DC

## 2011-06-06 NOTE — Telephone Encounter (Signed)
[facility] electronically request refill on Amlodipine 10 mg # 90 x 0 with note pt needs to call for appt. Lotrisone lotion request sent to Dr Milinda Antis.

## 2011-06-06 NOTE — Telephone Encounter (Signed)
He is due for a physical - schedule when able Will refill electronically

## 2011-06-20 NOTE — Telephone Encounter (Signed)
Patient notified as instructed by telephone. Pt scheduled CPX 08/05/11 at 11:15am.

## 2011-06-25 LAB — URINALYSIS, ROUTINE W REFLEX MICROSCOPIC
Ketones, ur: NEGATIVE
Nitrite: NEGATIVE
Specific Gravity, Urine: 1.009
Urobilinogen, UA: 0.2
pH: 8

## 2011-06-25 LAB — I-STAT 8, (EC8 V) (CONVERTED LAB)
BUN: 13
Bicarbonate: 28.2 — ABNORMAL HIGH
HCT: 49
Operator id: 279831
pCO2, Ven: 34.6 — ABNORMAL LOW
pH, Ven: 7.52 — ABNORMAL HIGH

## 2011-06-25 LAB — HEPATIC FUNCTION PANEL
ALT: 29
Albumin: 4.6
Alkaline Phosphatase: 50
Total Bilirubin: 0.8

## 2011-06-25 LAB — CBC
Platelets: 239
RDW: 13.3

## 2011-06-25 LAB — POCT I-STAT CREATININE: Creatinine, Ser: 1

## 2011-06-25 LAB — LIPASE, BLOOD: Lipase: 20

## 2011-06-25 LAB — DIFFERENTIAL
Basophils Absolute: 0
Lymphocytes Relative: 33
Neutro Abs: 4.8

## 2011-06-27 LAB — CBC
HCT: 42.7
Hemoglobin: 14.7
MCHC: 34.4
MCV: 90.4
RBC: 4.72
RDW: 13.4

## 2011-06-27 LAB — COMPREHENSIVE METABOLIC PANEL
BUN: 12
CO2: 28
Calcium: 9.2
GFR calc non Af Amer: >60
Glucose, Bld: 117 — ABNORMAL HIGH
Total Protein: 6.7

## 2011-06-27 LAB — URINALYSIS, ROUTINE W REFLEX MICROSCOPIC
Bilirubin Urine: NEGATIVE
Glucose, UA: NEGATIVE
Hgb urine dipstick: NEGATIVE
Ketones, ur: NEGATIVE
Nitrite: NEGATIVE
Specific Gravity, Urine: 1.011
pH: 7

## 2011-06-27 LAB — DIFFERENTIAL
Basophils Relative: 1
Eosinophils Absolute: 0.4
Lymphs Abs: 3
Monocytes Relative: 8
Neutro Abs: 2.5
Neutrophils Relative %: 38 — ABNORMAL LOW

## 2011-06-27 LAB — LIPASE, BLOOD: Lipase: 16

## 2011-07-25 ENCOUNTER — Telehealth: Payer: Self-pay | Admitting: *Deleted

## 2011-07-25 MED ORDER — ESOMEPRAZOLE MAGNESIUM 40 MG PO CPDR: 40.0000 mg | DELAYED_RELEASE_CAPSULE | Freq: Every day | ORAL | Status: DC

## 2011-07-25 NOTE — Telephone Encounter (Signed)
Patient notified as instructed by telephone. 

## 2011-07-25 NOTE — Telephone Encounter (Signed)
Pt is asking if he can change from omeprazole to nexium, he says he can get nexium for no charge through his insurance.  [facility].  He has physical scheduled for 11/19.

## 2011-07-25 NOTE — Telephone Encounter (Signed)
Ok - will change that Will refill electronically  Will disc further at his f/u

## 2011-08-05 ENCOUNTER — Ambulatory Visit (INDEPENDENT_AMBULATORY_CARE_PROVIDER_SITE_OTHER): Payer: 59 | Admitting: Family Medicine

## 2011-08-05 ENCOUNTER — Encounter: Payer: Self-pay | Admitting: Family Medicine

## 2011-08-05 VITALS — BP 120/76 | HR 64 | Temp 97.5°F | Ht 68.25 in | Wt 199.5 lb

## 2011-08-05 DIAGNOSIS — Z860100 Personal history of colon polyps, unspecified: Secondary | ICD-10-CM

## 2011-08-05 DIAGNOSIS — Z0000 Encounter for general adult medical examination without abnormal findings: Secondary | ICD-10-CM

## 2011-08-05 DIAGNOSIS — I10 Essential (primary) hypertension: Secondary | ICD-10-CM

## 2011-08-05 DIAGNOSIS — Z23 Encounter for immunization: Secondary | ICD-10-CM

## 2011-08-05 DIAGNOSIS — R7309 Other abnormal glucose: Secondary | ICD-10-CM

## 2011-08-05 DIAGNOSIS — Z125 Encounter for screening for malignant neoplasm of prostate: Secondary | ICD-10-CM

## 2011-08-05 DIAGNOSIS — E785 Hyperlipidemia, unspecified: Secondary | ICD-10-CM

## 2011-08-05 LAB — COMPREHENSIVE METABOLIC PANEL
ALT: 33 U/L (ref 0–53)
Albumin: 4.2 g/dL (ref 3.5–5.2)
CO2: 30 mEq/L (ref 19–32)
Calcium: 8.9 mg/dL (ref 8.4–10.5)
Chloride: 105 mEq/L (ref 96–112)
GFR: 73.59 mL/min (ref >60.00)
Glucose, Bld: 107 mg/dL — ABNORMAL HIGH (ref 70–99)
Sodium: 142 mEq/L (ref 135–145)
Total Protein: 7.4 g/dL (ref 6.0–8.3)

## 2011-08-05 LAB — CBC WITH DIFFERENTIAL/PLATELET
Basophils Absolute: 0.1 10*3/uL (ref 0.0–0.1)
Eosinophils Relative: 3.8 % (ref 0.0–5.0)
HCT: 39.3 % (ref 39.0–52.0)
Hemoglobin: 13.4 g/dL (ref 13.0–17.0)
Lymphocytes Relative: 46.4 % — ABNORMAL HIGH (ref 12.0–46.0)
Lymphs Abs: 3.5 10*3/uL (ref 0.7–4.0)
Monocytes Relative: 10.4 % (ref 3.0–12.0)
Platelets: 201 10*3/uL (ref 150.0–400.0)
RDW: 13.9 % (ref 11.5–14.6)
WBC: 7.6 10*3/uL (ref 4.5–10.5)

## 2011-08-05 LAB — PSA: PSA: 2.12 ng/mL (ref 0.10–4.00)

## 2011-08-05 LAB — HEMOGLOBIN A1C: Hgb A1c MFr Bld: 6.1 % (ref 4.6–6.5)

## 2011-08-05 LAB — TSH: TSH: 2.36 u[IU]/mL (ref 0.35–5.50)

## 2011-08-05 LAB — LDL CHOLESTEROL, DIRECT: Direct LDL: 151 mg/dL

## 2011-08-05 MED ORDER — ALBUTEROL SULFATE HFA 108 (90 BASE) MCG/ACT IN AERS: 2.0000 | INHALATION_SPRAY | Freq: Four times a day (QID) | RESPIRATORY_TRACT | Status: DC | PRN

## 2011-08-05 MED ORDER — FLUTICASONE-SALMETEROL 100-50 MCG/DOSE IN AEPB: 1.0000 | INHALATION_SPRAY | Freq: Two times a day (BID) | RESPIRATORY_TRACT | Status: DC | PRN

## 2011-08-05 MED ORDER — ESOMEPRAZOLE MAGNESIUM 40 MG PO CPDR: 40.0000 mg | DELAYED_RELEASE_CAPSULE | Freq: Every day | ORAL | Status: DC

## 2011-08-05 MED ORDER — AMLODIPINE BESYLATE 10 MG PO TABS: 10.0000 mg | ORAL_TABLET | Freq: Every day | ORAL | Status: DC

## 2011-08-05 NOTE — Assessment & Plan Note (Signed)
bp in fair control at this time  No changes needed  Disc lifstyle change with low sodium diet and exercise  meds refilled Lab today

## 2011-08-05 NOTE — Assessment & Plan Note (Signed)
Reviewed health habits including diet and exercise and skin cancer prevention Also reviewed health mt list, fam hx and immunizations  Commended on wt loss Flu shot and Tdap today  Wellness labs today

## 2011-08-05 NOTE — Assessment & Plan Note (Signed)
Rev chart- not due for colonosc until 2014 - pt aware  No stool changes

## 2011-08-05 NOTE — Assessment & Plan Note (Signed)
psa today  No symptoms Nl DRE

## 2011-08-05 NOTE — Patient Instructions (Signed)
Labs today  Flu shot today and Tdap vaccine  Try to start regular exercise program (walking is fine) - aim to work up to 30 minutes 5 days per week

## 2011-08-05 NOTE — Assessment & Plan Note (Signed)
Check lipids today  Diet controlled Expect further imp with better diet  Rev low sat fat diet with pt

## 2011-08-05 NOTE — Assessment & Plan Note (Signed)
Check a1c today Great job with wt loss and low sugar diet  Recommend starting exercise program

## 2011-08-05 NOTE — Progress Notes (Signed)
Subjective:    Patient ID: Jerry [name], male    DOB: 07/19/1956, 55 y.o.   MRN: [id number]  HPI Here for annual health mt exam and to review chronic med problems  Has been feeling ok  Had a cold last week -- a little residual chest congestion    Wt is down 9 lb with bmi of 30 Has cut out soft drinks and sweetened drinks all together and sweets too    Needs labs for wellness and prostate screen and lipids   Lab Results  Component Value Date   CHOL 192 05/01/2010   HDL 32.10* 05/01/2010   LDLCALC 123* 05/04/2007   LDLDIRECT 115.7 05/01/2010   TRIG 322.0* 05/01/2010   CHOLHDL 6 05/01/2010   Diet - overall very good  Exercise-- needs to start back in a program -- really likes to walk- plans to get back to it   Flu shot-- has not had yet -- will get one today  Td 03- will go ahead and get Tdap   HTN in good control with norvasc 120/76  Hx of colon polyp 5/08- will be due for 5 year recall in 2014- no colon cancer in family  No prostate problems Due for screening  Seldom nocturia   Patient Active Problem List  Diagnoses  . TINEA CRURIS  . HYPERLIPIDEMIA  . OBSTRUCTIVE SLEEP APNEA  . TINNITUS  . HYPERTENSION  . ALLERGIC RHINITIS  . ASTHMA  . GASTRITIS  . HEADACHE  . HYPERGLYCEMIA, MILD  . COLONIC POLYPS, HX OF  . Routine general medical examination at a health care facility  . Prostate cancer screening   Past Medical History  Diagnosis Date  . Allergic rhinitis, cause unspecified   . Asthma   . Other and unspecified hyperlipidemia   . Unspecified essential hypertension    Past Surgical History  Procedure Date  . Cholecystectomy   . Kidney stone surgery   . 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 Left leg fracture    History  Substance Use Topics  . Smoking status: Former Smoker -- 0.5 packs/day for 3 years    Types: Cigarettes    Quit date: 09/16/1974  . Smokeless tobacco: Not on file  . Alcohol Use: Not on file   Family History  Problem  Relation Age of Onset  . Lung cancer Father   . Diabetes Mother   . Pancreatic cancer Paternal Grandfather    Allergies  Allergen Reactions  . Fluticasone Propionate     REACTION: nosebleeds   Current Outpatient Prescriptions on File Prior to Visit  Medication Sig Dispense Refill  . clotrimazole-betamethasone (LOTRISONE) lotion APPLY TO AFFECTED AREA ONCE DAILY AS NEEDED  30 mL  0  . fish oil-omega-3 fatty acids 1000 MG capsule Take 1 g by mouth daily.        Marland Kitchen loratadine (CLARITIN) 10 MG tablet Take 10 mg by mouth daily.        . Multiple Vitamin (MULTIVITAMIN) capsule Take 1 capsule by mouth daily.        . clotrimazole-betamethasone (LOTRISONE) cream Apply topically 2 (two) times daily as needed.           Review of Systems Review of Systems  Constitutional: Negative for fever, appetite change, fatigue and unexpected weight change.  Eyes: Negative for pain and visual disturbance.  Respiratory: Negative for cough and shortness of breath.   Cardiovascular: Negative for cp or palpitations    Gastrointestinal: Negative for nausea, diarrhea  and constipation.  Genitourinary: Negative for urgency and frequency.  Skin: Negative for pallor or rash   Neurological: Negative for weakness, light-headedness, numbness and headaches.  Hematological: Negative for adenopathy. Does not bruise/bleed easily.  Psychiatric/Behavioral: Negative for dysphoric mood. The patient is not nervous/anxious.          Objective:   Physical Exam  Constitutional: He appears well-developed and well-nourished. No distress.  HENT:  Head: Normocephalic and atraumatic.  Right Ear: External ear normal.  Left Ear: External ear normal.  Nose: Nose normal.  Mouth/Throat: Oropharynx is clear and moist.  Eyes: Conjunctivae and EOM are normal. Pupils are equal, round, and reactive to light. No scleral icterus.  Neck: Normal range of motion. Neck supple. No JVD present. Carotid bruit is not present. No thyromegaly  present.  Cardiovascular: Normal rate, regular rhythm, normal heart sounds and intact distal pulses.  Exam reveals no gallop.   Pulmonary/Chest: Effort normal and breath sounds normal. No respiratory distress. He has no wheezes.  Abdominal: Soft. Bowel sounds are normal. He exhibits no distension, no abdominal bruit and no mass. There is no tenderness.  Genitourinary: Rectum normal and prostate normal. Guaiac negative stool.  Musculoskeletal: Normal range of motion. He exhibits no edema and no tenderness.  Lymphadenopathy:    He has no cervical adenopathy.  Neurological: He is alert. He has normal reflexes. No cranial nerve deficit. He exhibits normal muscle tone. Coordination normal.  Skin: Skin is warm and dry. No rash noted. No erythema. No pallor.  Psychiatric: He has a normal mood and affect.          Assessment & Plan:

## 2011-09-09 ENCOUNTER — Telehealth: Payer: Self-pay | Admitting: Pulmonary Disease

## 2011-09-09 DIAGNOSIS — G4733 Obstructive sleep apnea (adult) (pediatric): Secondary | ICD-10-CM

## 2011-09-09 NOTE — Telephone Encounter (Signed)
Order has been sent to [facility] : [facility] for cpap supplies and humidifier for pt ---pt is to follow up in may 2013 with [facility] per last ov note.

## 2011-09-18 ENCOUNTER — Telehealth: Payer: Self-pay

## 2011-09-18 NOTE — Telephone Encounter (Signed)
Patient notified as instructed by telephone. 

## 2011-09-18 NOTE — Telephone Encounter (Signed)
Pt has non productive cough,sore in middle of chest when coughs. Pt said he has started wheezing also. No shortness of breath.Pt fever is 100 also ears feel raw and throat is sore.Pt is taking Tylenol and Guaifenesin 400 mg about every 4 hours. Pt symptoms started 09/16/11. Pt uses [facility] in [place] if pharmacy is needed. No available appts today. [phone] # pt can be reached at.

## 2011-09-18 NOTE — Telephone Encounter (Signed)
This sounds like the crud that is going around - I recommend otc cough med/ his ventolin for wheezing (update if wheezing gets severe or fever over 102 , tylenol for fever and lots of rest  If worse - need to try to put him in with first available or advise urgent care

## 2011-09-28 ENCOUNTER — Encounter (HOSPITAL_COMMUNITY): Payer: Self-pay | Admitting: Emergency Medicine

## 2011-09-28 ENCOUNTER — Emergency Department (HOSPITAL_COMMUNITY)
Admission: EM | Admit: 2011-09-28 | Discharge: 2011-09-28 | Disposition: A | Discharge status: Home / Self Care | Payer: 59 | Source: Home / Self Care | Attending: Family Medicine | Admitting: Family Medicine

## 2011-09-28 ENCOUNTER — Emergency Department (INDEPENDENT_AMBULATORY_CARE_PROVIDER_SITE_OTHER): Payer: 59

## 2011-09-28 DIAGNOSIS — J40 Bronchitis, not specified as acute or chronic: Secondary | ICD-10-CM

## 2011-09-28 DIAGNOSIS — J069 Acute upper respiratory infection, unspecified: Secondary | ICD-10-CM

## 2011-09-28 MED ORDER — AZITHROMYCIN 250 MG PO TABS: 500.0000 mg | ORAL_TABLET | Freq: Once | ORAL | Status: AC

## 2011-09-28 MED ORDER — HYDROCOD POLST-CHLORPHEN POLST 10-8 MG/5ML PO LQCR: 5.0000 mL | Freq: Two times a day (BID) | ORAL | Status: DC | PRN

## 2011-09-28 MED ORDER — ALBUTEROL SULFATE HFA 108 (90 BASE) MCG/ACT IN AERS: 2.0000 | INHALATION_SPRAY | RESPIRATORY_TRACT | Status: DC | PRN

## 2011-09-28 NOTE — ED Provider Notes (Cosign Needed)
History     CSN: [id number]  Arrival date & time 09/28/11  1140   First MD Initiated Contact with Patient 09/28/11 1152      Chief Complaint  Patient presents with  . Cough    (Consider location/radiation/quality/duration/timing/severity/associated sxs/prior treatment) Patient is a 56 y.o. male presenting with cough. The history is provided by the patient.  Cough This is a new problem. The current episode started more than 1 week ago (amost 2 weeks). The problem occurs every few minutes. The problem has been gradually worsening. The cough is productive of purulent sputum. The maximum temperature recorded prior to his arrival was 100 to 100.9 F. Associated symptoms include ear congestion, sore throat and myalgias. Pertinent negatives include no chest pain and no sweats. He has tried cough syrup for the symptoms. The treatment provided no relief. His past medical history is significant for pneumonia and asthma. His past medical history does not include bronchiectasis, COPD or emphysema.    Past Medical History  Diagnosis Date  . Allergic rhinitis, cause unspecified   . Asthma   . Other and unspecified hyperlipidemia   . Unspecified essential hypertension     Past Surgical History  Procedure Date  . Cholecystectomy   . Kidney stone surgery   . 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 Left leg fracture     Family History  Problem Relation Age of Onset  . Lung cancer Father   . Diabetes Mother   . Pancreatic cancer Paternal Grandfather     History  Substance Use Topics  . Smoking status: Never Smoker   . Smokeless tobacco: Not on file  . Alcohol Use: No      Review of Systems  HENT: Positive for congestion and sore throat.   Respiratory: Positive for cough.   Cardiovascular: Negative for chest pain.  Musculoskeletal: Positive for myalgias.       Myalgia , sore throat, fever and nasal congestion occurred mostly at the beginning of his illnesss    Allergies   Fluticasone propionate  Home Medications   Current Outpatient Rx  Name Route Sig Dispense Refill  . ACETAMINOPHEN 325 MG PO TABS Oral Take 650 mg by mouth every 6 (six) hours as needed.    . GUAIFENESIN ER 600 MG PO TB12 Oral Take 1,200 mg by mouth 2 (two) times daily.    . IBUPROFEN 200 MG PO TABS Oral Take 200 mg by mouth every 6 (six) hours as needed.    . ALBUTEROL SULFATE HFA 108 (90 BASE) MCG/ACT IN AERS Inhalation Inhale 2 puffs into the lungs every 6 (six) hours as needed. 1 Inhaler 3  . AMLODIPINE BESYLATE 10 MG PO TABS Oral Take 1 tablet (10 mg total) by mouth daily. 90 tablet 3    Pt needs to call for appt.  Marland Kitchen CLOTRIMAZOLE-BETAMETHASONE 1-0.05 % EX CREA Topical Apply topically 2 (two) times daily as needed.     Marland Kitchen CLOTRIMAZOLE-BETAMETHASONE 1-0.05 % EX LOTN  APPLY TO AFFECTED AREA ONCE DAILY AS NEEDED 30 mL 0  . ESOMEPRAZOLE MAGNESIUM 40 MG PO CPDR Oral Take 1 capsule (40 mg total) by mouth daily. 90 capsule 3  . OMEGA-3 FATTY ACIDS 1000 MG PO CAPS Oral Take 1 g by mouth daily.      Marland Kitchen FLUTICASONE-SALMETEROL 100-50 MCG/DOSE IN AEPB Inhalation Inhale 1 puff into the lungs 2 (two) times daily as needed. 3 each 3  . LORATADINE 10 MG PO TABS Oral Take 10 mg by  mouth daily.      . MULTIVITAMINS PO CAPS Oral Take 1 capsule by mouth daily.        BP 151/97  Pulse 80  Temp(Src) 98.3 F (36.8 C) (Oral)  Resp 20  SpO2 96%  Physical Exam  Constitutional: He is oriented to person, place, and time. He appears well-developed and well-nourished.  HENT:  Head: Normocephalic.  Neck: Neck supple.  Cardiovascular: Normal rate and regular rhythm.   Pulmonary/Chest: Effort normal and breath sounds normal. No respiratory distress. He has no wheezes. He has no rales.  Neurological: He is alert and oriented to person, place, and time.  Skin: Skin is warm.  Psychiatric: He has a normal mood and affect.    ED Course  Procedures (including critical care time)  Labs Reviewed - No data to  display No results found.   No diagnosis found.    MDM          Hassan Rowan, MD 09/29/11 [phone]

## 2011-09-28 NOTE — ED Notes (Signed)
Return to treatment room 

## 2011-09-28 NOTE — ED Notes (Signed)
C/o cough and fever.  Cough and fever intermittent, seems to get better than worse.  Minimal productive cough.  All started out as sore throat, sore throat has improved, feels cold has moved into chest

## 2011-11-12 ENCOUNTER — Other Ambulatory Visit: Payer: Self-pay | Admitting: Family Medicine

## 2011-11-12 DIAGNOSIS — E785 Hyperlipidemia, unspecified: Secondary | ICD-10-CM

## 2011-11-12 DIAGNOSIS — R7309 Other abnormal glucose: Secondary | ICD-10-CM

## 2011-11-14 ENCOUNTER — Other Ambulatory Visit: Payer: 59

## 2012-01-02 ENCOUNTER — Other Ambulatory Visit: Payer: Self-pay | Admitting: Family Medicine

## 2012-01-09 ENCOUNTER — Encounter: Payer: Self-pay | Admitting: Internal Medicine

## 2012-05-15 ENCOUNTER — Ambulatory Visit (AMBULATORY_SURGERY_CENTER): Payer: 59

## 2012-05-15 VITALS — Ht 69.5 in | Wt 208.0 lb

## 2012-05-15 DIAGNOSIS — Z1211 Encounter for screening for malignant neoplasm of colon: Secondary | ICD-10-CM

## 2012-05-15 MED ORDER — MOVIPREP 100 G PO SOLR: 1.0000 | Freq: Once | ORAL | Status: DC

## 2012-05-28 ENCOUNTER — Ambulatory Visit (AMBULATORY_SURGERY_CENTER): Payer: 59 | Admitting: Internal Medicine

## 2012-05-28 ENCOUNTER — Encounter: Payer: Self-pay | Admitting: Internal Medicine

## 2012-05-28 VITALS — BP 148/85 | HR 58 | Temp 98.0°F | Resp 16 | Ht 69.0 in | Wt 208.0 lb

## 2012-05-28 DIAGNOSIS — D126 Benign neoplasm of colon, unspecified: Secondary | ICD-10-CM

## 2012-05-28 DIAGNOSIS — Z1211 Encounter for screening for malignant neoplasm of colon: Secondary | ICD-10-CM

## 2012-05-28 DIAGNOSIS — Z86010 Personal history of colonic polyps: Secondary | ICD-10-CM

## 2012-05-28 MED ORDER — SODIUM CHLORIDE 0.9 % IV SOLN: 500.0000 mL | INTRAVENOUS | Status: DC

## 2012-05-28 NOTE — Progress Notes (Signed)
Patient did not experience any of the following events: a burn prior to discharge; a fall within the facility; wrong site/side/patient/procedure/implant event; or a hospital transfer or hospital admission upon discharge from the facility. (G8907) Patient with preoperative order for IV antibiotic SSI prophylaxis, antibiotic initiated on time. (G8916)  

## 2012-05-28 NOTE — Op Note (Signed)
[facility] [address] [place], [address]   COLONOSCOPY PROCEDURE REPORT  PATIENT: [name], Jerry  MR#: [id number] BIRTHDATE: 05/05/1956 , 55  yrs. old GENDER: Male ENDOSCOPIST: Roxy Cedar, MD REFERRED AO:[id number] Program Recall PROCEDURE DATE:  05/28/2012 PROCEDURE:   Colonoscopy with snare polypectomy    x 7 ASA CLASS:   Class II INDICATIONS:high risk screening; patient's personal history of adenomatous colon polyps (2001 neg. ; 2008 w/ TA's). MEDICATIONS: MAC sedation, administered by CRNA and propofol (Diprivan) 300mg  IV  DESCRIPTION OF PROCEDURE:   After the risks benefits and alternatives of the procedure were thoroughly explained, informed consent was obtained.  A digital rectal exam revealed no abnormalities of the rectum.   The LB CF-[id number]  endoscope was introduced through the anus and advanced to the cecum, which was identified by both the appendix and ileocecal valve. No adverse events experienced.   The quality of the prep was excellent, using MoviPrep  The instrument was then slowly withdrawn as the colon was fully examined.      COLON FINDINGS: Seven polyps ranging between 3-52mm in size were found in the ascending colon, transverse colon (4), and descending (2) colon.  Polypectomies were performed with a cold snare.  The resections were complete and the polyp tissue was completely retrieved.   Mild diverticulosis was noted in the sigmoid colon. The colon mucosa was otherwise normal.  Retroflexed views revealed no abnormalities. The time to cecum=1 minutes 41 seconds. Withdrawal time=14 minutes 50 seconds.  The scope was withdrawn and the procedure completed. COMPLICATIONS: There were no complications.  ENDOSCOPIC IMPRESSION: 1.   Seven polyps ranging between 3-48mm in size were found in the ascending colon, transverse colon, and descending colon; polypectomy was performed with a cold snare 2.   Mild diverticulosis  was noted in the sigmoid colon 3.   The colon mucosa was otherwise normal  RECOMMENDATIONS: 1. repeat Colonoscopy in 3 years, if 3 or more adenomas.   eSigned:  Roxy Cedar, MD 05/28/2012 12:19 PM  cc: Judy Pimple, MD and The Patient   PATIENT NAME:  Jerry [name], Jerry [name] MR#: [id number]

## 2012-05-28 NOTE — Progress Notes (Signed)
Patient did not have preoperative order for IV antibiotic SSI prophylaxis. ([id number])   

## 2012-05-28 NOTE — Patient Instructions (Addendum)
YOU HAD AN ENDOSCOPIC PROCEDURE TODAY AT THE [facility]: Refer to the procedure report that was given to you for any specific questions about what was found during the examination.  If the procedure report does not answer your questions, please call your gastroenterologist to clarify.  If you requested that your care partner not be given the details of your procedure findings, then the procedure report has been included in a sealed envelope for you to review at your convenience later.  YOU SHOULD EXPECT: Some feelings of bloating in the abdomen. Passage of more gas than usual.  Walking can help get rid of the air that was put into your GI tract during the procedure and reduce the bloating. If you had a lower endoscopy (such as a colonoscopy or flexible sigmoidoscopy) you may notice spotting of blood in your stool or on the toilet paper. If you underwent a bowel prep for your procedure, then you may not have a normal bowel movement for a few days.  DIET: Your first meal following the procedure should be a light meal and then it is ok to progress to your normal diet.  A half-sandwich or bowl of soup is an example of a good first meal.  Heavy or fried foods are harder to digest and may make you feel nauseous or bloated.  Likewise meals heavy in dairy and vegetables can cause extra gas to form and this can also increase the bloating.  Drink plenty of fluids but you should avoid alcoholic beverages for 24 hours.  ACTIVITY: Your care partner should take you home directly after the procedure.  You should plan to take it easy, moving slowly for the rest of the day.  You can resume normal activity the day after the procedure however you should NOT DRIVE or use heavy machinery for 24 hours (because of the sedation medicines used during the test).    SYMPTOMS TO REPORT IMMEDIATELY: A gastroenterologist can be reached at any hour.  During normal business hours, 8:30 AM to 5:00 PM Monday through Friday,  call [phone].  After hours and on weekends, please call the GI answering service at [phone] who will take a message and have the physician on call contact you.   Following lower endoscopy (colonoscopy or flexible sigmoidoscopy):  Excessive amounts of blood in the stool  Significant tenderness or worsening of abdominal pains  Swelling of the abdomen that is new, acute  Fever of 100F or higher  FOLLOW UP: If any biopsies were taken you will be contacted by phone or by letter within the next 1-3 weeks.  Call your gastroenterologist if you have not heard about the biopsies in 3 weeks.  Our staff will call the home number listed on your records the next business day following your procedure to check on you and address any questions or concerns that you may have at that time regarding the information given to you following your procedure. This is a courtesy call and so if there is no answer at the home number and we have not heard from you through the emergency physician on call, we will assume that you have returned to your regular daily activities without incident.  SIGNATURES/CONFIDENTIALITY: You and/or your care partner have signed paperwork which will be entered into your electronic medical record.  These signatures attest to the fact that that the information above on your After Visit Summary has been reviewed and is understood.  Full responsibility of the confidentiality of this   discharge information lies with you and/or your care-partner.   Thank-you for choosing us for your healthcare needs. 

## 2012-05-29 ENCOUNTER — Telehealth: Payer: Self-pay | Admitting: *Deleted

## 2012-05-29 NOTE — Telephone Encounter (Signed)
  Follow up Call-  Call back number 05/28/2012  Post procedure Call Back phone  # [phone]  Permission to leave phone message Yes     Patient questions:  Do you have a fever, pain , or abdominal swelling? no Pain Score  0 *  Have you tolerated food without any problems? yes  Have you been able to return to your normal activities? yes  Do you have any questions about your discharge instructions: Diet   no Medications  no Follow up visit  no  Do you have questions or concerns about your Care? no  Actions: * If pain score is 4 or above: No action needed, pain <4.

## 2012-06-02 ENCOUNTER — Encounter: Payer: Self-pay | Admitting: Internal Medicine

## 2012-08-19 ENCOUNTER — Other Ambulatory Visit: Payer: Self-pay | Admitting: *Deleted

## 2012-08-19 MED ORDER — ESOMEPRAZOLE MAGNESIUM 40 MG PO CPDR: 40.0000 mg | DELAYED_RELEASE_CAPSULE | Freq: Every day | ORAL | Status: DC

## 2012-08-29 ENCOUNTER — Telehealth: Payer: Self-pay | Admitting: Family Medicine

## 2012-08-29 DIAGNOSIS — R7309 Other abnormal glucose: Secondary | ICD-10-CM

## 2012-08-29 DIAGNOSIS — Z0000 Encounter for general adult medical examination without abnormal findings: Secondary | ICD-10-CM

## 2012-08-29 DIAGNOSIS — E785 Hyperlipidemia, unspecified: Secondary | ICD-10-CM

## 2012-08-29 DIAGNOSIS — Z125 Encounter for screening for malignant neoplasm of prostate: Secondary | ICD-10-CM

## 2012-08-29 NOTE — Telephone Encounter (Signed)
Message copied by Judy Pimple on Sat Aug 29, 2012  6:31 PM ------      Message from: Baldomero Lamy      Created: Wed Aug 26, 2012  1:08 PM      Regarding: Cpx labs Mon 12/16       Please order  future cpx labs for pt's upcoming lab appt.      Thanks      Rodney Booze

## 2012-08-31 ENCOUNTER — Other Ambulatory Visit: Payer: 59

## 2012-09-03 ENCOUNTER — Other Ambulatory Visit (INDEPENDENT_AMBULATORY_CARE_PROVIDER_SITE_OTHER): Payer: 59

## 2012-09-03 DIAGNOSIS — Z125 Encounter for screening for malignant neoplasm of prostate: Secondary | ICD-10-CM

## 2012-09-03 DIAGNOSIS — Z0000 Encounter for general adult medical examination without abnormal findings: Secondary | ICD-10-CM

## 2012-09-03 DIAGNOSIS — E785 Hyperlipidemia, unspecified: Secondary | ICD-10-CM

## 2012-09-03 DIAGNOSIS — R7309 Other abnormal glucose: Secondary | ICD-10-CM

## 2012-09-03 LAB — TSH: TSH: 2.28 u[IU]/mL (ref 0.35–5.50)

## 2012-09-03 LAB — LDL CHOLESTEROL, DIRECT: Direct LDL: 160.9 mg/dL

## 2012-09-03 LAB — COMPREHENSIVE METABOLIC PANEL
AST: 26 U/L (ref 0–37)
Albumin: 4.3 g/dL (ref 3.5–5.2)
BUN: 14 mg/dL (ref 6–23)
Calcium: 9.4 mg/dL (ref 8.4–10.5)
Chloride: 104 mEq/L (ref 96–112)
Potassium: 4 mEq/L (ref 3.5–5.1)
Sodium: 142 mEq/L (ref 135–145)
Total Protein: 7.6 g/dL (ref 6.0–8.3)

## 2012-09-03 LAB — CBC WITH DIFFERENTIAL/PLATELET
Basophils Relative: 0.7 % (ref 0.0–3.0)
Eosinophils Absolute: 0.5 10*3/uL (ref 0.0–0.7)
Eosinophils Relative: 5.3 % — ABNORMAL HIGH (ref 0.0–5.0)
Lymphocytes Relative: 41.9 % (ref 12.0–46.0)
MCHC: 33.1 g/dL (ref 30.0–36.0)
Neutrophils Relative %: 42.9 % — ABNORMAL LOW (ref 43.0–77.0)
Platelets: 234 10*3/uL (ref 150.0–400.0)
RBC: 4.92 Mil/uL (ref 4.22–5.81)
WBC: 8.5 10*3/uL (ref 4.5–10.5)

## 2012-09-03 LAB — LIPID PANEL
Total CHOL/HDL Ratio: 6
Triglycerides: 188 mg/dL — ABNORMAL HIGH (ref 0.0–149.0)

## 2012-09-03 LAB — PSA: PSA: 2.53 ng/mL (ref 0.10–4.00)

## 2012-09-04 ENCOUNTER — Other Ambulatory Visit: Payer: Self-pay | Admitting: *Deleted

## 2012-09-04 MED ORDER — AMLODIPINE BESYLATE 10 MG PO TABS: 10.0000 mg | ORAL_TABLET | Freq: Every day | ORAL | Status: DC

## 2012-09-07 ENCOUNTER — Ambulatory Visit (INDEPENDENT_AMBULATORY_CARE_PROVIDER_SITE_OTHER): Payer: 59 | Admitting: Family Medicine

## 2012-09-07 ENCOUNTER — Encounter: Payer: Self-pay | Admitting: Family Medicine

## 2012-09-07 VITALS — BP 150/90 | HR 65 | Temp 98.2°F | Ht 68.25 in | Wt 206.0 lb

## 2012-09-07 DIAGNOSIS — I10 Essential (primary) hypertension: Secondary | ICD-10-CM

## 2012-09-07 DIAGNOSIS — E785 Hyperlipidemia, unspecified: Secondary | ICD-10-CM

## 2012-09-07 DIAGNOSIS — Z125 Encounter for screening for malignant neoplasm of prostate: Secondary | ICD-10-CM

## 2012-09-07 DIAGNOSIS — Z0000 Encounter for general adult medical examination without abnormal findings: Secondary | ICD-10-CM

## 2012-09-07 DIAGNOSIS — R0789 Other chest pain: Secondary | ICD-10-CM | POA: Insufficient documentation

## 2012-09-07 DIAGNOSIS — R079 Chest pain, unspecified: Secondary | ICD-10-CM

## 2012-09-07 DIAGNOSIS — E119 Type 2 diabetes mellitus without complications: Secondary | ICD-10-CM

## 2012-09-07 MED ORDER — LISINOPRIL 10 MG PO TABS: 10.0000 mg | ORAL_TABLET | Freq: Every day | ORAL | Status: DC

## 2012-09-07 MED ORDER — OMEPRAZOLE 40 MG PO CPDR: 40.0000 mg | DELAYED_RELEASE_CAPSULE | Freq: Every day | ORAL | Status: DC

## 2012-09-07 MED ORDER — AMLODIPINE BESYLATE 10 MG PO TABS: 10.0000 mg | ORAL_TABLET | Freq: Every day | ORAL | Status: DC

## 2012-09-07 NOTE — Patient Instructions (Addendum)
Cut way back on sugar and starch intake Try to exercise 5 days per week  Get a flu vaccine at any pharmacy  We will do referral for DM ed at check out  Start lisinopril 10 mg for blood pressure and also kidney protection  No other medicine changes - until you run out of nexium and I have sent in prilosec generic  Cholesterol is high (Avoid red meat/ fried foods/ egg yolks/ fatty breakfast meats/ butter, cheese and high fat dairy/ and shellfish  )- we will discuss this in more detail next time Follow up in 4-6 weeks

## 2012-09-07 NOTE — Progress Notes (Signed)
Subjective:    Patient ID: Jerry [name], male    DOB: July 18, 1956, 56 y.o.   MRN: [id number]  HPI Here for health maintenance exam and to review chronic medical problems    Has been feeling pretty good  Has had a pain in right chest - that he thinks is gas - feels better if he burps  Is sharp - and varies in intensity and length Asa/ ibuprofen makes it worse- and is worse if he does not eat  He no longer has a gallbladder  Takes nexium every day  Is not exertional at all - in fact he notices it when he is still and not moving  Worst in am when he gets up  No nausea or sob or sweaty   Last stress test was about 5 years ago   bp is up today  No cp or palpitations or headaches or edema  No side effects to medicines  BP Readings from Last 3 Encounters:  09/07/12 150/90  05/28/12 148/85  09/28/11 151/97     Wt is down 2 lb with bmi of 31 Not exercising like he should- just starting back with walking and lifting weights  Diet - has been pretty good overall - and he is trying hard to stay away from sugar- it makes him tired   His goal is to loose 30 lb (has done it the past)  That was with extreme calorie restriction   Flu vaccine-needs it - will get it at a pharmacy  colonosc 9/13 - polyps 3 year recall   Hyperlipidemia Lab Results  Component Value Date   CHOL 219* 09/03/2012   CHOL 205* 08/05/2011   CHOL 192 05/01/2010   Lab Results  Component Value Date   HDL 39.20 09/03/2012   HDL 43.70 08/05/2011   HDL 32.10* 05/01/2010   Lab Results  Component Value Date   LDLCALC 123* 05/04/2007   Lab Results  Component Value Date   TRIG 188.0* 09/03/2012   TRIG 101.0 08/05/2011   TRIG 322.0* 05/01/2010   Lab Results  Component Value Date   CHOLHDL 6 09/03/2012   CHOLHDL 5 08/05/2011   CHOLHDL 6 05/01/2010   Lab Results  Component Value Date   LDLDIRECT 160.9 09/03/2012   LDLDIRECT 151.0 08/05/2011   LDLDIRECT 115.7 05/01/2010  cholesterol is high       Hyperglycemia Lab Results  Component Value Date   HGBA1C 7.0* 09/03/2012   has a family hx of DM- this is new No excessive thirst or urination   Prostate cancer screen  Lab Results  Component Value Date   PSA 2.53 09/03/2012   PSA 2.12 08/05/2011   PSA 1.99 05/01/2010   no prostate problems or trouble urinating  No nocturia   Patient Active Problem List  Diagnosis  . TINEA CRURIS  . HYPERLIPIDEMIA  . OBSTRUCTIVE SLEEP APNEA  . TINNITUS  . HYPERTENSION  . ALLERGIC RHINITIS  . ASTHMA  . GASTRITIS  . HEADACHE  . HYPERGLYCEMIA, MILD  . COLONIC POLYPS, HX OF  . Routine general medical examination at a health care facility  . Prostate cancer screening   Past Medical History  Diagnosis Date  . Allergic rhinitis, cause unspecified   . Asthma   . Other and unspecified hyperlipidemia   . Unspecified essential hypertension   . Hx of colonic polyps    Past Surgical History  Procedure Date  . Cholecystectomy   . Kidney stone surgery   . 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 Left leg fracture    History  Substance Use Topics  . Smoking status: Former Smoker -- 0.5 packs/day for 3 years    Types: Cigarettes    Quit date: 09/16/1974  . Smokeless tobacco: Never Used  . Alcohol Use: No   Family History  Problem Relation Age of Onset  . Lung cancer Father   . Diabetes Mother   . Pancreatic cancer Paternal Grandfather   . Colon cancer Neg Hx    Allergies  Allergen Reactions  . Fluticasone Propionate     REACTION: nosebleeds   Current Outpatient Prescriptions on File Prior to Visit  Medication Sig Dispense Refill  . acetaminophen (TYLENOL) 325 MG tablet Take 650 mg by mouth every 6 (six) hours as needed.      Marland Kitchen albuterol (PROVENTIL HFA;VENTOLIN HFA) 108 (90 BASE) MCG/ACT inhaler Inhale 2 puffs into the lungs every 4 (four) hours as needed for wheezing.  1 Inhaler  1  . albuterol (VENTOLIN HFA) 108 (90 BASE) MCG/ACT inhaler Inhale 2 puffs into the lungs  every 6 (six) hours as needed.  1 Inhaler  3  . amLODipine (NORVASC) 10 MG tablet Take 1 tablet (10 mg total) by mouth daily.  90 tablet  0  . clotrimazole-betamethasone (LOTRISONE) lotion APPLY TO AFFECTED AREA ONCE DAILY AS NEEDED  30 mL  2  . esomeprazole (NEXIUM) 40 MG capsule Take 1 capsule (40 mg total) by mouth daily.  90 capsule  0  . fish oil-omega-3 fatty acids 1000 MG capsule Take 1 g by mouth daily.        . Fluticasone-Salmeterol (ADVAIR DISKUS) 100-50 MCG/DOSE AEPB Inhale 1 puff into the lungs 2 (two) times daily as needed.  3 each  3  . loratadine (CLARITIN) 10 MG tablet Take 10 mg by mouth daily.        . Multiple Vitamin (MULTIVITAMIN) capsule Take 1 capsule by mouth daily.            Review of Systems Review of Systems  Constitutional: Negative for fever, appetite change, fatigue and unexpected weight change.  Eyes: Negative for pain and visual disturbance.  Respiratory: Negative for cough and shortness of breath.   Cardiovascular: Negative for cp or palpitations    Gastrointestinal: Negative for nausea, diarrhea and constipation.  Genitourinary: Negative for urgency and frequency.  Skin: Negative for pallor or rash   Neurological: Negative for weakness, light-headedness, numbness and headaches.  Hematological: Negative for adenopathy. Does not bruise/bleed easily.  Psychiatric/Behavioral: Negative for dysphoric mood. The patient is not nervous/anxious.         Objective:   Physical Exam  Constitutional: He appears well-developed and well-nourished. No distress.  HENT:  Head: Normocephalic and atraumatic.  Right Ear: External ear normal.  Left Ear: External ear normal.  Nose: Nose normal.  Mouth/Throat: Oropharynx is clear and moist.  Eyes: Conjunctivae normal and EOM are normal. Pupils are equal, round, and reactive to light. Right eye exhibits no discharge. Left eye exhibits no discharge. No scleral icterus.  Neck: Normal range of motion. Neck supple. No JVD  present. Carotid bruit is not present. No thyromegaly present.  Cardiovascular: Normal rate, regular rhythm, normal heart sounds and intact distal pulses.  Exam reveals no gallop.   Pulmonary/Chest: Effort normal and breath sounds normal. No respiratory distress. He has no wheezes. He exhibits no tenderness.  Abdominal: Soft. Bowel sounds are normal. He exhibits no distension, no abdominal bruit and no mass. There  is no tenderness.  Musculoskeletal: He exhibits no edema and no tenderness.  Lymphadenopathy:    He has no cervical adenopathy.  Neurological: He is alert. He has normal reflexes. No cranial nerve deficit. He exhibits normal muscle tone. Coordination normal.  Skin: Skin is warm and dry. No rash noted. No erythema. No pallor.  Psychiatric: He has a normal mood and affect.          Assessment & Plan:

## 2012-09-09 NOTE — Assessment & Plan Note (Signed)
Cholesterol is high Disc goals for lipids and reasons to control them Rev labs with pt Rev low sat fat diet in detail d Will disc statin at f/u

## 2012-09-09 NOTE — Assessment & Plan Note (Signed)
Ref to DM teaching  Disc diet/ exercise/ need for wt loss No symptoms Add ace Will disc statin at f/u  F/u planned

## 2012-09-09 NOTE — Assessment & Plan Note (Signed)
psa stable  No symptoms Disc pros/cons of prostate screening

## 2012-09-09 NOTE — Assessment & Plan Note (Signed)
Reviewed health habits including diet and exercise and skin cancer prevention Also reviewed health mt list, fam hx and immunizations   Labs rev in detail Pt will get flu vaccine at pharmacy since we are out of them

## 2012-09-09 NOTE — Assessment & Plan Note (Signed)
Atypical with some GI symptoms  Rev EKG Disc s/s angina in detail  Will try H2 blocker and update F/u planned If worse or not imp will update/ go to ER if after hours

## 2012-09-09 NOTE — Assessment & Plan Note (Signed)
bp is up  Will add ace for bp and also renal protection from DM F/u 4-6 weeks Update me if side eff

## 2012-10-19 ENCOUNTER — Ambulatory Visit: Payer: 59 | Admitting: Family Medicine

## 2012-10-23 ENCOUNTER — Encounter: Payer: Self-pay | Admitting: Family Medicine

## 2012-10-23 ENCOUNTER — Ambulatory Visit (INDEPENDENT_AMBULATORY_CARE_PROVIDER_SITE_OTHER): Payer: 59 | Admitting: Family Medicine

## 2012-10-23 VITALS — BP 134/78 | HR 62 | Temp 98.3°F | Ht 68.25 in | Wt 205.5 lb

## 2012-10-23 DIAGNOSIS — E785 Hyperlipidemia, unspecified: Secondary | ICD-10-CM

## 2012-10-23 DIAGNOSIS — Z23 Encounter for immunization: Secondary | ICD-10-CM

## 2012-10-23 DIAGNOSIS — I10 Essential (primary) hypertension: Secondary | ICD-10-CM

## 2012-10-23 DIAGNOSIS — E119 Type 2 diabetes mellitus without complications: Secondary | ICD-10-CM

## 2012-10-23 MED ORDER — LISINOPRIL 10 MG PO TABS: 10.0000 mg | ORAL_TABLET | Freq: Every day | ORAL | Status: DC

## 2012-10-23 NOTE — Assessment & Plan Note (Signed)
Starting DM ed and also another diet/ exercise plan with support Commended Lab and f/u 3 mo

## 2012-10-23 NOTE — Assessment & Plan Note (Signed)
Pt beginning a lifestyle program now  Wants to hold off on statin until after he gives that a try Disc goals for lipids and reasons to control them Rev labs with pt Rev low sat fat diet in detail Lab and f/u 3 mo

## 2012-10-23 NOTE — Patient Instructions (Addendum)
I'm glad you have a plan for healthy diet and exercise  Stay away from sugar  Avoid red meat/ fried foods/ egg yolks/ fatty breakfast meats/ butter, cheese and high fat dairy/ and shellfish   Goal of exercise - 5 days per week Schedule fasting lab in 3 months and then follow up  Flu shot today

## 2012-10-23 NOTE — Progress Notes (Signed)
Subjective:    Patient ID: Jerry [name], male    DOB: Mar 24, 1956, 57 y.o.   MRN: [id number]  HPI Here for f/u of chronic medical problems  Had ACE added for bp last time bp is stable today  No cp or palpitations or headaches or edema  No side effects to medicines --no problems at all  BP Readings from Last 3 Encounters:  10/23/12 134/78  09/07/12 150/90  05/28/12 148/85      DM-teaching was ref for that - scheduled on the 8th of march His eating is better  Is getting ready to start the "Reuel Boom" plan - lifestyle change , thinks that will fit him well  Cuts out sugar and white flour and bad fats -more lean protein and low carb veg  Fitness is part of it as well as support group to exercise with  He wants to loose weight with lifestyle    Lipids Lab Results  Component Value Date   CHOL 219* 09/03/2012   HDL 39.20 09/03/2012   LDLCALC 123* 05/04/2007   LDLDIRECT 160.9 09/03/2012   TRIG 188.0* 09/03/2012   CHOLHDL 6 09/03/2012   Disc diet  Needs statin - wants to wait and re check after lifestyle change   Last visit- gastritis/gerd with cp- much better / is on omeprazole   Patient Active Problem List  Diagnosis  . TINEA CRURIS  . HYPERLIPIDEMIA  . OBSTRUCTIVE SLEEP APNEA  . TINNITUS  . HYPERTENSION  . ALLERGIC RHINITIS  . ASTHMA  . GASTRITIS  . HEADACHE  . Diabetes type 2, controlled  . COLONIC POLYPS, HX OF  . Routine general medical examination at a health care facility  . Prostate cancer screening  . Chest pain at rest   Past Medical History  Diagnosis Date  . Allergic rhinitis, cause unspecified   . Asthma   . Other and unspecified hyperlipidemia   . Unspecified essential hypertension   . Hx of colonic polyps    Past Surgical History  Procedure Date  . Cholecystectomy   . Kidney stone surgery   . 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 Left leg fracture    History  Substance Use Topics  . Smoking status: Former Smoker -- 0.5  packs/day for 3 years    Types: Cigarettes    Quit date: 09/16/1974  . Smokeless tobacco: Never Used  . Alcohol Use: No   Family History  Problem Relation Age of Onset  . Lung cancer Father   . Diabetes Mother   . Pancreatic cancer Paternal Grandfather   . Colon cancer Neg Hx    Allergies  Allergen Reactions  . Fluticasone Propionate     REACTION: nosebleeds   Current Outpatient Prescriptions on File Prior to Visit  Medication Sig Dispense Refill  . acetaminophen (TYLENOL) 325 MG tablet Take 650 mg by mouth every 6 (six) hours as needed.      Marland Kitchen albuterol (VENTOLIN HFA) 108 (90 BASE) MCG/ACT inhaler Inhale 2 puffs into the lungs every 6 (six) hours as needed.  1 Inhaler  3  . amLODipine (NORVASC) 10 MG tablet Take 1 tablet (10 mg total) by mouth daily.  90 tablet  3  . clotrimazole-betamethasone (LOTRISONE) lotion APPLY TO AFFECTED AREA ONCE DAILY AS NEEDED  30 mL  2  . fish oil-omega-3 fatty acids 1000 MG capsule Take 1 g by mouth daily.        . Fluticasone-Salmeterol (ADVAIR DISKUS) 100-50 MCG/DOSE AEPB Inhale 1 puff  into the lungs 2 (two) times daily as needed.  3 each  3  . lisinopril (PRINIVIL,ZESTRIL) 10 MG tablet Take 1 tablet (10 mg total) by mouth daily.  30 tablet  3  . loratadine (CLARITIN) 10 MG tablet Take 10 mg by mouth daily.        . Multiple Vitamin (MULTIVITAMIN) capsule Take 1 capsule by mouth daily.        Marland Kitchen omeprazole (PRILOSEC) 40 MG capsule Take 1 capsule (40 mg total) by mouth daily.  90 capsule  3  . albuterol (PROVENTIL HFA;VENTOLIN HFA) 108 (90 BASE) MCG/ACT inhaler Inhale 2 puffs into the lungs every 4 (four) hours as needed for wheezing.  1 Inhaler  1      Review of Systems Review of Systems  Constitutional: Negative for fever, appetite change, fatigue and unexpected weight change.  Eyes: Negative for pain and visual disturbance.  Respiratory: Negative for cough and shortness of breath.   Cardiovascular: Negative for cp or palpitations     Gastrointestinal: Negative for nausea, diarrhea and constipation.  Genitourinary: Negative for urgency and frequency.  Skin: Negative for pallor or rash   Neurological: Negative for weakness, light-headedness, numbness and headaches.  Hematological: Negative for adenopathy. Does not bruise/bleed easily.  Psychiatric/Behavioral: Negative for dysphoric mood. The patient is not nervous/anxious.         Objective:   Physical Exam  Constitutional: He appears well-developed and well-nourished. No distress.  obese and well appearing   HENT:  Head: Normocephalic and atraumatic.  Mouth/Throat: Oropharynx is clear and moist.  Eyes: Conjunctivae and EOM are normal. Pupils are equal, round, and reactive to light. Right eye exhibits no discharge. Left eye exhibits no discharge. No scleral icterus.  Neck: Normal range of motion. Neck supple. No JVD present. Carotid bruit is not present. No thyromegaly present.  Cardiovascular: Normal rate, regular rhythm, normal heart sounds and intact distal pulses.  Exam reveals no gallop.   Pulmonary/Chest: Effort normal and breath sounds normal. No respiratory distress. He has no wheezes.  Abdominal: Soft. Bowel sounds are normal. He exhibits no distension, no abdominal bruit and no mass. There is no tenderness.  Musculoskeletal: He exhibits no edema.  Lymphadenopathy:    He has no cervical adenopathy.  Neurological: He is alert. He has normal reflexes. No cranial nerve deficit. He exhibits normal muscle tone. Coordination normal.  Skin: Skin is warm and dry. No rash noted. No erythema. No pallor.  Psychiatric: He has a normal mood and affect.          Assessment & Plan:

## 2012-10-23 NOTE — Assessment & Plan Note (Signed)
Improved with addition of lisinpril F/u 3 mo with labs  bp in fair control at this time  No changes needed  Disc lifstyle change with low sodium diet and exercise

## 2012-11-21 ENCOUNTER — Encounter: Payer: 59 | Attending: Family Medicine

## 2012-12-22 ENCOUNTER — Encounter: Payer: 59 | Attending: Family Medicine | Admitting: *Deleted

## 2012-12-22 VITALS — Ht 69.0 in | Wt 184.3 lb

## 2012-12-22 DIAGNOSIS — E119 Type 2 diabetes mellitus without complications: Secondary | ICD-10-CM | POA: Insufficient documentation

## 2012-12-22 DIAGNOSIS — Z713 Dietary counseling and surveillance: Secondary | ICD-10-CM | POA: Insufficient documentation

## 2012-12-25 ENCOUNTER — Encounter: Payer: Self-pay | Admitting: *Deleted

## 2012-12-25 NOTE — Patient Instructions (Signed)
Goals:  Follow Diabetes Meal Plan as instructed  Eat 3 meals and 2 snacks, every 3-5 hrs  Limit carbohydrate intake to 45-60 grams carbohydrate/meal  Limit carbohydrate intake to 30 grams carbohydrate/snack  Add lean protein foods to meals/snacks  Monitor glucose levels as instructed by your doctor  Aim for 30 mins of physical activity daily as tolerated  Bring food record and glucose log to your next nutrition visit

## 2012-12-25 NOTE — Progress Notes (Signed)
Patient was seen on 12/22/2012 for the first of a series of three diabetes self-management courses at the [facility]. Patient's most recent A1c was 7.0 % on 09/03/2012 The following learning objectives were met by the patient during this course:   Defines the role of glucose and insulin  Identifies type of diabetes and pathophysiology  Defines the diagnostic criteria for diabetes and prediabetes  States the risk factors for Type 2 Diabetes  States the symptoms of Type 2 Diabetes  Defines Type 2 Diabetes treatment goals  Defines Type 2 Diabetes treatment options  States the rationale for glucose monitoring  Identifies A1C, glucose targets, and testing times  Identifies proper sharps disposal  Defines the purpose of a diabetes food plan  Identifies carbohydrate food groups  Defines effects of carbohydrate foods on glucose levels  Identifies carbohydrate choices/grams/food labels  States benefits of physical activity and effect on glucose  Review of suggested activity guidelines  Handouts given during class include:  Type 2 Diabetes: Basics Book  My Food Plan Book  Food and Activity Log   Follow-Up Plan: Core Class 2

## 2012-12-29 ENCOUNTER — Encounter: Payer: 59 | Admitting: *Deleted

## 2012-12-29 DIAGNOSIS — E119 Type 2 diabetes mellitus without complications: Secondary | ICD-10-CM

## 2012-12-30 NOTE — Progress Notes (Signed)
  Patient was seen on 12/29/12 for the second of a series of three diabetes self-management courses at the [facility]. The following learning objectives were met by the patient during this course:   Explain basic nutrition maintenance and quality assurance  Describe causes, symptoms and treatment of hypoglycemia and hyperglycemia  Explain how to manage diabetes during illness  Describe the importance of good nutrition for health and healthy eating strategies  List strategies to follow meal plan when dining out  Describe the effects of alcohol on glucose and how to use it safely  Describe problem solving skills for day-to-day glucose challenges  Describe strategies to use when treatment plan needs to change  Identify important factors involved in successful weight loss  Describe ways to remain physically active  Describe the impact of regular activity on insulin resistance    Handouts given in class:  Refrigerator magnet for Sick Day Guidelines  [facility] Oral medication/insulin handout  Follow-Up Plan: Patient will attend the final class of the ADA Diabetes Self-Care Education.    

## 2013-01-12 ENCOUNTER — Encounter: Payer: 59 | Admitting: Dietician

## 2013-01-12 DIAGNOSIS — E119 Type 2 diabetes mellitus without complications: Secondary | ICD-10-CM

## 2013-01-13 NOTE — Progress Notes (Signed)
  Patient was seen on 01/12/2013 for the third of a series of three diabetes self-management courses at the [facility]. The following learning objectives were met by the patient during this course:  HT: 69 in  WT: 184.3 lb  A1C: 7.0 (09/03/2012)    Describe how diabetes changes over time   Identify diabetes complications and ways to prevent them   Describe strategies that can promote heart health including lowering blood pressure and cholesterol   Describe strategies to lower dietary fat and sodium in the diet   Identify physical activities that benefit cardiovascular health   Evaluate success in meeting personal goal   Describe the belief that they can live successfully with diabetes day to day   Establish 2-3 goals that they will plan to diligently work on until they return for the free 47-month follow-up visit  The following handouts were given in class:  3 Month Follow Up Visit handout  Goal setting handout  Class evaluation form  Your patient has established the following 3 month goals for diabetes self-care:  Count carbs at most of my meals.  Reduce fat in my diet by eating less red meat at tow or more meals a day.  Be active 30 or more minutes or more 5 times a week.  Use less salt.  Exercise at least 5 times a week to help manage my stress.  Follow-Up Plan: Patient will attend a 3 month follow-up visit for diabetes self-management education.

## 2013-01-18 ENCOUNTER — Other Ambulatory Visit (INDEPENDENT_AMBULATORY_CARE_PROVIDER_SITE_OTHER): Payer: 59

## 2013-01-18 DIAGNOSIS — E785 Hyperlipidemia, unspecified: Secondary | ICD-10-CM

## 2013-01-18 DIAGNOSIS — Z0000 Encounter for general adult medical examination without abnormal findings: Secondary | ICD-10-CM

## 2013-01-18 DIAGNOSIS — E119 Type 2 diabetes mellitus without complications: Secondary | ICD-10-CM

## 2013-01-18 DIAGNOSIS — I10 Essential (primary) hypertension: Secondary | ICD-10-CM

## 2013-01-18 LAB — LIPID PANEL
HDL: 45.3 mg/dL (ref >39.00)
Triglycerides: 73 mg/dL (ref 0.0–149.0)

## 2013-01-18 LAB — COMPREHENSIVE METABOLIC PANEL
BUN: 16 mg/dL (ref 6–23)
CO2: 25 mEq/L (ref 19–32)
Creatinine, Ser: 1 mg/dL (ref 0.4–1.5)
GFR: 83.65 mL/min (ref >60.00)
Glucose, Bld: 116 mg/dL — ABNORMAL HIGH (ref 70–99)
Total Bilirubin: 1.1 mg/dL (ref 0.3–1.2)

## 2013-01-18 LAB — HEMOGLOBIN A1C: Hgb A1c MFr Bld: 5.9 % (ref 4.6–6.5)

## 2013-01-22 ENCOUNTER — Encounter: Payer: Self-pay | Admitting: Family Medicine

## 2013-01-22 ENCOUNTER — Ambulatory Visit (INDEPENDENT_AMBULATORY_CARE_PROVIDER_SITE_OTHER): Payer: 59 | Admitting: Family Medicine

## 2013-01-22 VITALS — BP 136/84 | HR 66 | Temp 98.6°F | Ht 68.25 in | Wt 175.5 lb

## 2013-01-22 DIAGNOSIS — E119 Type 2 diabetes mellitus without complications: Secondary | ICD-10-CM

## 2013-01-22 DIAGNOSIS — I10 Essential (primary) hypertension: Secondary | ICD-10-CM

## 2013-01-22 DIAGNOSIS — E785 Hyperlipidemia, unspecified: Secondary | ICD-10-CM

## 2013-01-22 NOTE — Assessment & Plan Note (Signed)
Pt is off lisinopril with excellent bp at home  Today - forgot med- and still not bad  Rev labs  Will continue amlodipine

## 2013-01-22 NOTE — Assessment & Plan Note (Signed)
Lipids are greatly improved with lifestyle change Avoid red meat/ fried foods/ egg yolks/ fatty breakfast meats/ butter, cheese and high fat dairy/ and shellfish   Will continue to follow  F/u jan

## 2013-01-22 NOTE — Progress Notes (Signed)
Subjective:    Patient ID: Jerry [name], male    DOB: 1956/05/15, 57 y.o.   MRN: [id number]  HPI Here for f/u of chronic health problems   Wt is down 9 lb with bmi of 26 Is following the "daniel plan"- cut out sugar/ white flour and dairy -- doing with his wife  Is easy for him - he enjoys it as a rule  Eats beef about once per week- much smaller servings and chooses lower fat  For exercise -walk /bike Kayleen Memos out/ [employer]  Feels better   DM Much imp with DM ed and lifestyle  Lab Results  Component Value Date   HGBA1C 5.9 01/18/2013    This is down from 7   Lipids Lab Results  Component Value Date   CHOL 174 01/18/2013   CHOL 219* 09/03/2012   CHOL 205* 08/05/2011   Lab Results  Component Value Date   HDL 45.30 01/18/2013   HDL 39.20 09/03/2012   HDL 43.70 08/05/2011   Lab Results  Component Value Date   LDLCALC 114* 01/18/2013   LDLCALC 123* 05/04/2007   Lab Results  Component Value Date   TRIG 73.0 01/18/2013   TRIG 188.0* 09/03/2012   TRIG 101.0 08/05/2011   Lab Results  Component Value Date   CHOLHDL 4 01/18/2013   CHOLHDL 6 09/03/2012   CHOLHDL 5 08/05/2011   Lab Results  Component Value Date   LDLDIRECT 160.9 09/03/2012   LDLDIRECT 151.0 08/05/2011   LDLDIRECT 115.7 05/01/2010    Big improvement !    bp is stable today  No cp or palpitations or headaches or edema  No side effects to medicines - is on amlodipine and he ran out of lisinopril  Forgot med last night  BP Readings from Last 3 Encounters:  01/22/13 136/84  10/23/12 134/78  09/07/12 150/90      Patient Active Problem List   Diagnosis Date Noted  . Routine general medical examination at a health care facility 08/05/2011  . Prostate cancer screening 08/05/2011  . TINEA CRURIS 05/08/2010  . HEADACHE 09/20/2009  . TINNITUS 09/06/2009  . Hyperglycemia 08/21/2009  . COLONIC POLYPS, HX OF 01/16/2009  . OBSTRUCTIVE SLEEP APNEA 10/26/2008  . HYPERLIPIDEMIA 03/11/2007  . HYPERTENSION  03/11/2007  . ALLERGIC RHINITIS 03/11/2007  . ASTHMA 03/11/2007   Past Medical History  Diagnosis Date  . Allergic rhinitis, cause unspecified   . Asthma   . Other and unspecified hyperlipidemia   . Unspecified essential hypertension   . Hx of colonic polyps   . Diabetes mellitus without complication    Past Surgical History  Procedure Laterality Date  . Cholecystectomy    . Kidney stone surgery    . 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 Left leg fracture     History  Substance Use Topics  . Smoking status: Former Smoker -- 0.50 packs/day for 3 years    Types: Cigarettes    Quit date: 09/16/1974  . Smokeless tobacco: Never Used  . Alcohol Use: No   Family History  Problem Relation Age of Onset  . Lung cancer Father   . Diabetes Mother   . Pancreatic cancer Paternal Grandfather   . Colon cancer Neg Hx    Allergies  Allergen Reactions  . Fluticasone Propionate     REACTION: nosebleeds   Current Outpatient Prescriptions on File Prior to Visit  Medication Sig Dispense Refill  . acetaminophen (TYLENOL) 325 MG tablet Take 650 mg  by mouth every 6 (six) hours as needed.      Marland Kitchen albuterol (VENTOLIN HFA) 108 (90 BASE) MCG/ACT inhaler Inhale 2 puffs into the lungs every 6 (six) hours as needed.  1 Inhaler  3  . amLODipine (NORVASC) 10 MG tablet Take 1 tablet (10 mg total) by mouth daily.  90 tablet  3  . clotrimazole-betamethasone (LOTRISONE) lotion APPLY TO AFFECTED AREA ONCE DAILY AS NEEDED  30 mL  2  . fish oil-omega-3 fatty acids 1000 MG capsule Take 1 g by mouth daily.        . Fluticasone-Salmeterol (ADVAIR DISKUS) 100-50 MCG/DOSE AEPB Inhale 1 puff into the lungs 2 (two) times daily as needed.  3 each  3  . loratadine (CLARITIN) 10 MG tablet Take 10 mg by mouth daily.        . Multiple Vitamin (MULTIVITAMIN) capsule Take 1 capsule by mouth daily.        Marland Kitchen omeprazole (PRILOSEC) 40 MG capsule Take 1 capsule (40 mg total) by mouth daily.  90 capsule  3   No  current facility-administered medications on file prior to visit.     Review of Systems Review of Systems  Constitutional: Negative for fever, appetite change, fatigue and unexpected weight change.  Eyes: Negative for pain and visual disturbance.  Respiratory: Negative for cough and shortness of breath.   Cardiovascular: Negative for cp or palpitations    Gastrointestinal: Negative for nausea, diarrhea and constipation.  Genitourinary: Negative for urgency and frequency.  Skin: Negative for pallor or rash   Neurological: Negative for weakness, light-headedness, numbness and headaches.  Hematological: Negative for adenopathy. Does not bruise/bleed easily.  Psychiatric/Behavioral: Negative for dysphoric mood. The patient is not nervous/anxious.          Objective:   Physical Exam  Constitutional: He appears well-developed and well-nourished. No distress.  HENT:  Head: Normocephalic and atraumatic.  Mouth/Throat: Oropharynx is clear and moist.  Eyes: Conjunctivae and EOM are normal. Pupils are equal, round, and reactive to light. Right eye exhibits no discharge. Left eye exhibits no discharge. No scleral icterus.  Neck: Normal range of motion. Neck supple. No JVD present. Carotid bruit is not present. No thyromegaly present.  Cardiovascular: Normal rate, regular rhythm, normal heart sounds and intact distal pulses.  Exam reveals no gallop.   Pulmonary/Chest: Effort normal and breath sounds normal. No respiratory distress. He has no wheezes.  Abdominal: Soft. Bowel sounds are normal. He exhibits no distension, no abdominal bruit and no mass. There is no tenderness.  Musculoskeletal: He exhibits no edema.  Lymphadenopathy:    He has no cervical adenopathy.  Neurological: He is alert. He has normal reflexes. No cranial nerve deficit. He exhibits normal muscle tone. Coordination normal.  Skin: Skin is warm and dry. No rash noted. No erythema. No pallor.  Psychiatric: He has a normal mood  and affect.          Assessment & Plan:

## 2013-01-22 NOTE — Patient Instructions (Addendum)
Keep up the great work with diet and exercise and lifestyle change  Follow up in January for annual exam with labs prior  Labs look good!

## 2013-01-22 NOTE — Assessment & Plan Note (Signed)
Downgraded from DM with diet/ exercise and weight loss Lab Results  Component Value Date   HGBA1C 5.9 01/18/2013    Urged to keep it up  F/u jan with lab prior

## 2013-04-26 ENCOUNTER — Ambulatory Visit: Payer: 59 | Admitting: *Deleted

## 2013-05-21 ENCOUNTER — Ambulatory Visit: Payer: 59 | Admitting: *Deleted

## 2013-09-17 ENCOUNTER — Other Ambulatory Visit: Payer: 59

## 2013-09-20 ENCOUNTER — Telehealth: Payer: Self-pay | Admitting: Family Medicine

## 2013-09-20 DIAGNOSIS — R739 Hyperglycemia, unspecified: Secondary | ICD-10-CM

## 2013-09-20 DIAGNOSIS — Z0000 Encounter for general adult medical examination without abnormal findings: Secondary | ICD-10-CM

## 2013-09-20 DIAGNOSIS — Z125 Encounter for screening for malignant neoplasm of prostate: Secondary | ICD-10-CM

## 2013-09-20 NOTE — Telephone Encounter (Signed)
Message copied by Abner Greenspan on Mon Sep 20, 2013  9:15 PM ------      Message from: Ellamae Sia      Created: Fri Sep 10, 2013 11:32 AM      Regarding: Lab orders for Tuesday, 1.6.15       Patient is scheduled for CPX labs, please order future labs, Thanks , Terri       ------

## 2013-09-21 ENCOUNTER — Other Ambulatory Visit: Payer: 59

## 2013-09-22 ENCOUNTER — Other Ambulatory Visit: Payer: 59

## 2013-09-24 ENCOUNTER — Encounter: Payer: 59 | Admitting: Family Medicine

## 2013-10-18 ENCOUNTER — Other Ambulatory Visit: Payer: Self-pay | Admitting: Family Medicine

## 2014-01-18 ENCOUNTER — Other Ambulatory Visit (INDEPENDENT_AMBULATORY_CARE_PROVIDER_SITE_OTHER): Payer: 59

## 2014-01-18 DIAGNOSIS — I10 Essential (primary) hypertension: Secondary | ICD-10-CM

## 2014-01-18 DIAGNOSIS — E785 Hyperlipidemia, unspecified: Secondary | ICD-10-CM

## 2014-01-18 DIAGNOSIS — Z125 Encounter for screening for malignant neoplasm of prostate: Secondary | ICD-10-CM

## 2014-01-18 DIAGNOSIS — R739 Hyperglycemia, unspecified: Secondary | ICD-10-CM

## 2014-01-18 DIAGNOSIS — R7309 Other abnormal glucose: Secondary | ICD-10-CM

## 2014-01-18 DIAGNOSIS — Z0000 Encounter for general adult medical examination without abnormal findings: Secondary | ICD-10-CM

## 2014-01-18 LAB — LIPID PANEL
CHOLESTEROL: 185 mg/dL (ref 0–200)
HDL: 43.5 mg/dL (ref >39.00)
LDL Cholesterol: 119 mg/dL — ABNORMAL HIGH (ref 0–99)
TRIGLYCERIDES: 113 mg/dL (ref 0.0–149.0)
Total CHOL/HDL Ratio: 4
VLDL: 22.6 mg/dL (ref 0.0–40.0)

## 2014-01-18 LAB — CBC WITH DIFFERENTIAL/PLATELET
BASOS PCT: 0.7 % (ref 0.0–3.0)
Basophils Absolute: 0.1 10*3/uL (ref 0.0–0.1)
EOS ABS: 0.5 10*3/uL (ref 0.0–0.7)
Eosinophils Relative: 6.5 % — ABNORMAL HIGH (ref 0.0–5.0)
HEMATOCRIT: 42.2 % (ref 39.0–52.0)
HEMOGLOBIN: 14.4 g/dL (ref 13.0–17.0)
Lymphocytes Relative: 37.7 % (ref 12.0–46.0)
Lymphs Abs: 2.7 10*3/uL (ref 0.7–4.0)
MCHC: 34.2 g/dL (ref 30.0–36.0)
MCV: 93 fl (ref 78.0–100.0)
Monocytes Absolute: 0.6 10*3/uL (ref 0.1–1.0)
Monocytes Relative: 8.5 % (ref 3.0–12.0)
NEUTROS ABS: 3.4 10*3/uL (ref 1.4–7.7)
Neutrophils Relative %: 46.6 % (ref 43.0–77.0)
Platelets: 201 10*3/uL (ref 150.0–400.0)
RBC: 4.53 Mil/uL (ref 4.22–5.81)
RDW: 13.9 % (ref 11.5–15.5)
WBC: 7.2 10*3/uL (ref 4.0–10.5)

## 2014-01-18 LAB — COMPREHENSIVE METABOLIC PANEL
ALT: 19 U/L (ref 0–53)
AST: 19 U/L (ref 0–37)
Albumin: 4.1 g/dL (ref 3.5–5.2)
Alkaline Phosphatase: 34 U/L — ABNORMAL LOW (ref 39–117)
BUN: 17 mg/dL (ref 6–23)
CALCIUM: 9.3 mg/dL (ref 8.4–10.5)
CO2: 29 meq/L (ref 19–32)
CREATININE: 1.1 mg/dL (ref 0.4–1.5)
Chloride: 106 mEq/L (ref 96–112)
GFR: 76.98 mL/min (ref >60.00)
Glucose, Bld: 99 mg/dL (ref 70–99)
Potassium: 4 mEq/L (ref 3.5–5.1)
Sodium: 141 mEq/L (ref 135–145)
Total Bilirubin: 1 mg/dL (ref 0.2–1.2)
Total Protein: 6.7 g/dL (ref 6.0–8.3)

## 2014-01-18 LAB — TSH: TSH: 2.33 u[IU]/mL (ref 0.35–4.50)

## 2014-01-18 LAB — PSA: PSA: 2.66 ng/mL (ref 0.10–4.00)

## 2014-01-18 LAB — HEMOGLOBIN A1C: Hgb A1c MFr Bld: 5.7 % (ref 4.6–6.5)

## 2014-01-25 ENCOUNTER — Encounter: Payer: Self-pay | Admitting: Family Medicine

## 2014-01-25 ENCOUNTER — Ambulatory Visit (INDEPENDENT_AMBULATORY_CARE_PROVIDER_SITE_OTHER): Payer: 59 | Admitting: Family Medicine

## 2014-01-25 VITALS — BP 130/78 | HR 57 | Temp 98.3°F | Ht 68.0 in | Wt 177.5 lb

## 2014-01-25 DIAGNOSIS — R7309 Other abnormal glucose: Secondary | ICD-10-CM

## 2014-01-25 DIAGNOSIS — I10 Essential (primary) hypertension: Secondary | ICD-10-CM

## 2014-01-25 DIAGNOSIS — E785 Hyperlipidemia, unspecified: Secondary | ICD-10-CM

## 2014-01-25 DIAGNOSIS — R739 Hyperglycemia, unspecified: Secondary | ICD-10-CM

## 2014-01-25 DIAGNOSIS — Z0000 Encounter for general adult medical examination without abnormal findings: Secondary | ICD-10-CM

## 2014-01-25 DIAGNOSIS — Z125 Encounter for screening for malignant neoplasm of prostate: Secondary | ICD-10-CM

## 2014-01-25 MED ORDER — AMLODIPINE BESYLATE 10 MG PO TABS: ORAL_TABLET | ORAL | Status: DC

## 2014-01-25 NOTE — Assessment & Plan Note (Signed)
bp in fair control at this time  BP Readings from Last 1 Encounters:  01/25/14 130/78   No changes needed Disc lifstyle change with low sodium diet and exercise  Urged to keep up good habits Labs rev today

## 2014-01-25 NOTE — Patient Instructions (Signed)
Keep up the good work  Your colonoscopy will be due in fall of 2016  Glucose control continues to be excellent

## 2014-01-25 NOTE — Assessment & Plan Note (Signed)
Basically resolved with 30 lb wt loss last year Lab Results  Component Value Date   HGBA1C 5.7 01/18/2014    Pt does want to continue to monitor-as this motivates him Enc to continue low glycemic diet and exercise

## 2014-01-25 NOTE — Assessment & Plan Note (Signed)
Continue to be controlled by lifestyle change  Disc goals for lipids and reasons to control them Rev labs with pt Rev low sat fat diet in detail

## 2014-01-25 NOTE — Assessment & Plan Note (Signed)
Reviewed health habits including diet and exercise and skin cancer prevention Reviewed appropriate screening tests for age  Also reviewed health mt list, fam hx and immunization status , as well as social and family history   See HPI Congratulated on great health habits and mt of wt loss  Labs reviewed

## 2014-01-25 NOTE — Progress Notes (Signed)
Subjective:    Patient ID: Jerry [name], male    DOB: Oct 29, 1955, 58 y.o.   MRN: [id number]  HPI Here for health maintenance exam and to review chronic medical problems    Has been great , nothing new going on  Still taking great care of himself  Eating well  Walking and lifting weights  for exercise - doing very well   Wt is up 2 lb with bmi of 26  Flu vaccine - did not get one this season  Td 11/12  colonosc 9/13 polyp-recall 3 y   Hyperglycemia continues to improve Lab Results  Component Value Date   HGBA1C 5.7 01/18/2014   he continues to eat a low sugar diet and exercise  Still using the Daniel plan   bp is stable today  No cp or palpitations or headaches or edema  No side effects to medicines  BP Readings from Last 3 Encounters:  01/25/14 130/78  01/22/13 136/84  10/23/12 134/78      Prostate ca screen Once in a while will have frequent urination at night or in am -minor and not every day No pain  No flow issues  Lab Results  Component Value Date   PSA 2.66 01/18/2014   PSA 2.53 09/03/2012   PSA 2.12 08/05/2011   no prostate cancer in the family    Lab Results  Component Value Date   CHOL 185 01/18/2014   CHOL 174 01/18/2013   CHOL 219* 09/03/2012   Lab Results  Component Value Date   HDL 43.50 01/18/2014   HDL 45.30 01/18/2013   HDL 39.20 09/03/2012   Lab Results  Component Value Date   LDLCALC 119* 01/18/2014   LDLCALC 114* 01/18/2013   LDLCALC 123* 05/04/2007   Lab Results  Component Value Date   TRIG 113.0 01/18/2014   TRIG 73.0 01/18/2013   TRIG 188.0* 09/03/2012   Lab Results  Component Value Date   CHOLHDL 4 01/18/2014   CHOLHDL 4 01/18/2013   CHOLHDL 6 09/03/2012   Lab Results  Component Value Date   LDLDIRECT 160.9 09/03/2012   LDLDIRECT 151.0 08/05/2011   LDLDIRECT 115.7 05/01/2010   risk ratio is the same -stable and eats healthy   Patient Active Problem List   Diagnosis Date Noted  . Routine general medical examination at a health  care facility 08/05/2011  . Prostate cancer screening 08/05/2011  . TINEA CRURIS 05/08/2010  . HEADACHE 09/20/2009  . TINNITUS 09/06/2009  . Hyperglycemia 08/21/2009  . COLONIC POLYPS, HX OF 01/16/2009  . OBSTRUCTIVE SLEEP APNEA 10/26/2008  . HYPERLIPIDEMIA 03/11/2007  . HYPERTENSION 03/11/2007  . ALLERGIC RHINITIS 03/11/2007  . ASTHMA 03/11/2007   Past Medical History  Diagnosis Date  . Allergic rhinitis, cause unspecified   . Asthma   . Other and unspecified hyperlipidemia   . Unspecified essential hypertension   . Hx of colonic polyps   . Diabetes mellitus without complication    Past Surgical History  Procedure Laterality Date  . Cholecystectomy    . Kidney stone surgery    . 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 Left leg fracture     History  Substance Use Topics  . Smoking status: Former Smoker -- 0.50 packs/day for 3 years    Types: Cigarettes    Quit date: 09/16/1974  . Smokeless tobacco: Never Used  . Alcohol Use: No   Family History  Problem Relation Age of Onset  . Lung cancer Father   .  Diabetes Mother   . Pancreatic cancer Paternal Grandfather   . Colon cancer Neg Hx    Allergies  Allergen Reactions  . Fluticasone Propionate     REACTION: nosebleeds   Current Outpatient Prescriptions on File Prior to Visit  Medication Sig Dispense Refill  . amLODipine (NORVASC) 10 MG tablet TAKE 1 TABLET BY MOUTH DAILY.  90 tablet  1  . fish oil-omega-3 fatty acids 1000 MG capsule Take 1 g by mouth daily.        Marland Kitchen loratadine (CLARITIN) 10 MG tablet Take 10 mg by mouth daily.       . Multiple Vitamin (MULTIVITAMIN) capsule Take 1 capsule by mouth daily.         No current facility-administered medications on file prior to visit.     Review of Systems Review of Systems  Constitutional: Negative for fever, appetite change, fatigue and unexpected weight change.  Eyes: Negative for pain and visual disturbance.  Respiratory: Negative for cough and  shortness of breath.   Cardiovascular: Negative for cp or palpitations    Gastrointestinal: Negative for nausea, diarrhea and constipation.  Genitourinary: Negative for urgency and frequency.  Skin: Negative for pallor or rash   Neurological: Negative for weakness, light-headedness, numbness and headaches.  Hematological: Negative for adenopathy. Does not bruise/bleed easily.  Psychiatric/Behavioral: Negative for dysphoric mood. The patient is not nervous/anxious.         Objective:   Physical Exam  Constitutional: He appears well-developed and well-nourished. No distress.  HENT:  Head: Normocephalic and atraumatic.  Right Ear: External ear normal.  Left Ear: External ear normal.  Nose: Nose normal.  Mouth/Throat: Oropharynx is clear and moist.  Eyes: Conjunctivae and EOM are normal. Pupils are equal, round, and reactive to light. Right eye exhibits no discharge. Left eye exhibits no discharge. No scleral icterus.  Neck: Normal range of motion. Neck supple. No JVD present. Carotid bruit is not present. No thyromegaly present.  Cardiovascular: Normal rate, regular rhythm, normal heart sounds and intact distal pulses.  Exam reveals no gallop.   Pulmonary/Chest: Effort normal and breath sounds normal. No respiratory distress. He has no wheezes. He exhibits no tenderness.  Abdominal: Soft. Bowel sounds are normal. He exhibits no distension, no abdominal bruit and no mass. There is no tenderness.  Musculoskeletal: He exhibits no edema and no tenderness.  Lymphadenopathy:    He has no cervical adenopathy.  Neurological: He is alert. He has normal reflexes. No cranial nerve deficit. He exhibits normal muscle tone. Coordination normal.  Skin: Skin is warm and dry. No rash noted. No erythema. No pallor.  Some solar lentigos   Psychiatric: He has a normal mood and affect.  cheerful          Assessment & Plan:

## 2014-01-25 NOTE — Progress Notes (Signed)
Pre visit review using our clinic review tool, if applicable. No additional management support is needed unless otherwise documented below in the visit note. 

## 2014-01-25 NOTE — Assessment & Plan Note (Signed)
psa stable  No new symptoms  Pt occ gets frequent urination after eating chocolate  No fam hx

## 2014-01-26 ENCOUNTER — Telehealth: Payer: Self-pay | Admitting: Family Medicine

## 2014-01-26 NOTE — Telephone Encounter (Signed)
Relevant patient education assigned to patient using Emmi. ° °

## 2014-07-29 ENCOUNTER — Ambulatory Visit: Payer: Self-pay | Admitting: Family Medicine

## 2014-08-08 ENCOUNTER — Institutional Professional Consult (permissible substitution): Payer: 59 | Admitting: Pulmonary Disease

## 2014-10-03 ENCOUNTER — Ambulatory Visit (INDEPENDENT_AMBULATORY_CARE_PROVIDER_SITE_OTHER): Payer: 59 | Admitting: Pulmonary Disease

## 2014-10-03 ENCOUNTER — Encounter: Payer: Self-pay | Admitting: Pulmonary Disease

## 2014-10-03 VITALS — BP 130/78 | HR 54 | Temp 97.0°F | Ht 69.0 in | Wt 189.6 lb

## 2014-10-03 DIAGNOSIS — G4733 Obstructive sleep apnea (adult) (pediatric): Secondary | ICD-10-CM

## 2014-10-03 NOTE — Progress Notes (Signed)
Subjective:    Patient ID: Jerry [name], male    DOB: [birth year], 59 y.o.   MRN: [id number]  HPI The patient is a 59 year old male who comes in today as a self-referral for management of obstructive sleep apnea. He was first diagnosed in 2001 with very severe OSA, with an AHI greater than 100 events per hour. He was started on C Pap, and has worn religiously until this past October when he began to have difficulty with mask fit. The patient has lost considerable weight since his original diagnosis, and has not seen a big difference in his sleep or daytime alertness since being off the device. He does have snoring without C Pap, and is only rested 50% of the mornings upon arising. The patient C Pap machine is very old, and he is unsure if it is functioning properly. The patient notes definite sleepiness during the day with inactivity, and will drink large amounts of coffee to stay awake. His weight is down 20 pounds since his last visit with me in 2012, and his Epworth score today is 9   Sleep Questionnaire What time do you typically go to bed?( Between what hours) 9-11p 9-11p at 1458 on 10/03/14 by Inge Rise, CMA How long does it take you to fall asleep? 2-10 min 2-10 min at 1458 on 10/03/14 by Inge Rise, CMA How many times during the night do you wake up? 1 1 at 1458 on 10/03/14 by Inge Rise, CMA What time do you get out of bed to start your day? 0430 0430 at 1458 on 10/03/14 by Inge Rise, CMA Do you drive or operate heavy machinery in your occupation? No No at 1458 on 10/03/14 by Inge Rise, CMA How much has your weight changed (up or down) over the past two years? (In pounds) 0 oz (0 kg) 0 oz (0 kg) at 1458 on 10/03/14 by Inge Rise, CMA Have you ever had a sleep study before? Yes Yes at 1458 on 10/03/14 by Inge Rise, CMA If yes, [facility] at 1458 on 10/03/14 by Inge Rise, CMA If yes, date of study? 2001 2001 at 1458 on  10/03/14 by Inge Rise, CMA Do you currently use CPAP? No No at 1458 on 10/03/14 by Inge Rise, CMA Do you wear oxygen at any time? No No at 1458 on 10/03/14 by Inge Rise, CMA   Review of Systems  Constitutional: Negative for fever and unexpected weight change.  HENT: Negative for congestion, dental problem, ear pain, nosebleeds, postnasal drip, rhinorrhea, sinus pressure, sneezing, sore throat and trouble swallowing.   Eyes: Negative for redness and itching.  Respiratory: Negative for cough, chest tightness, shortness of breath and wheezing.   Cardiovascular: Negative for palpitations and leg swelling.  Gastrointestinal: Negative for nausea and vomiting.  Genitourinary: Negative for dysuria.  Musculoskeletal: Negative for joint swelling.  Skin: Negative for rash.  Neurological: Negative for headaches.  Hematological: Does not bruise/bleed easily.  Psychiatric/Behavioral: Negative for dysphoric mood. The patient is not nervous/anxious.        Objective:   Physical Exam Constitutional:  Overweight male, no acute distress  HENT:  Nares patent without discharge, but septal deviation to the left with near obstruction  Oropharynx without exudate, palate and uvula are elongated.  Eyes:  Perrla, eomi, no scleral icterus  Neck:  No JVD, no TMG  Cardiovascular:  Normal rate, regular rhythm, no rubs or gallops.  2/6 sem        Intact distal pulses  Pulmonary :  Normal breath sounds, no stridor or respiratory distress   No rales, rhonchi, or wheezing  Abdominal:  Soft, nondistended, bowel sounds present.  No tenderness noted.   Musculoskeletal:  No lower extremity edema noted.  Lymph Nodes:  No cervical lymphadenopathy noted  Skin:  No cyanosis noted  Neurologic:  Alert, appropriate, moves all 4 extremities without obvious deficit.         Assessment & Plan:

## 2014-10-03 NOTE — Patient Instructions (Signed)
Will schedule for home sleep testing, and will call you once the results are available.  Keep working on modest weight loss.

## 2014-10-03 NOTE — Assessment & Plan Note (Signed)
The patient has a history of severe obstructive sleep apnea, and has done very well with C Pap in the past. He has had mask fit issues since October of last year, and has not been using his device. He is still snoring, but has not seen a big difference in his sleep being off the device. He does still have alertness issues during the day despite having lost 20 pounds since the last visit. At this point, it is unclear if the patient still has sleep apnea or not, and how severe it may be. I have had a discussion with him about getting him a new device with a mask fitting session, versus doing a home sleep test to find out where he stands. If his sleep apnea has significantly improved with weight loss, he may be a candidate for a dental appliance or simply further weight loss. The patient is agreeable to this approach.

## 2014-11-21 ENCOUNTER — Ambulatory Visit (HOSPITAL_BASED_OUTPATIENT_CLINIC_OR_DEPARTMENT_OTHER): Payer: 59 | Attending: Pulmonary Disease | Admitting: Radiology

## 2014-11-21 VITALS — Ht 69.0 in | Wt 180.0 lb

## 2014-11-21 DIAGNOSIS — G4733 Obstructive sleep apnea (adult) (pediatric): Secondary | ICD-10-CM | POA: Diagnosis not present

## 2014-12-02 ENCOUNTER — Other Ambulatory Visit: Payer: Self-pay | Admitting: Pulmonary Disease

## 2014-12-02 ENCOUNTER — Telehealth: Payer: Self-pay | Admitting: Pulmonary Disease

## 2014-12-02 DIAGNOSIS — G4733 Obstructive sleep apnea (adult) (pediatric): Secondary | ICD-10-CM

## 2014-12-02 NOTE — Telephone Encounter (Signed)
I spoke with patient about results and he verbalized understanding. He is willing to get back on CPAP.  Will send back to [facility] to place order

## 2014-12-02 NOTE — Telephone Encounter (Signed)
Mindy. Let pt know that his sleep study shows that he still has severe sleep apnea.   He closed off his airway 35 times an hour. I would recommend that he go back on cpap, and we can get him a new machine.  If he is ok with this, let me know and I can order.

## 2014-12-07 DIAGNOSIS — G4733 Obstructive sleep apnea (adult) (pediatric): Secondary | ICD-10-CM | POA: Diagnosis not present

## 2014-12-07 NOTE — Telephone Encounter (Signed)
[place] has placed order. Nothing further needed

## 2015-01-22 ENCOUNTER — Telehealth: Payer: Self-pay | Admitting: Family Medicine

## 2015-01-22 DIAGNOSIS — R739 Hyperglycemia, unspecified: Secondary | ICD-10-CM

## 2015-01-22 DIAGNOSIS — Z125 Encounter for screening for malignant neoplasm of prostate: Secondary | ICD-10-CM

## 2015-01-22 DIAGNOSIS — Z0000 Encounter for general adult medical examination without abnormal findings: Secondary | ICD-10-CM

## 2015-01-22 NOTE — Telephone Encounter (Signed)
-----   Message from Marchia Bond sent at 01/17/2015  1:56 PM EDT ----- Regarding: Cpx labs Mon 5/9, need orders please. :-) Please order  future cpx labs for pt's upcoming lab appt. Thanks Aniceto Boss

## 2015-01-23 ENCOUNTER — Other Ambulatory Visit: Payer: Self-pay | Admitting: Family Medicine

## 2015-01-23 ENCOUNTER — Other Ambulatory Visit: Payer: 59

## 2015-01-27 ENCOUNTER — Encounter: Payer: 59 | Admitting: Family Medicine

## 2015-04-05 ENCOUNTER — Encounter: Payer: Self-pay | Admitting: Internal Medicine

## 2015-04-24 ENCOUNTER — Encounter: Payer: Self-pay | Admitting: Pulmonary Disease

## 2015-04-24 ENCOUNTER — Ambulatory Visit (INDEPENDENT_AMBULATORY_CARE_PROVIDER_SITE_OTHER): Payer: 59 | Admitting: Pulmonary Disease

## 2015-04-24 VITALS — BP 130/80 | HR 58 | Ht 69.5 in | Wt 194.4 lb

## 2015-04-24 DIAGNOSIS — G4733 Obstructive sleep apnea (adult) (pediatric): Secondary | ICD-10-CM | POA: Diagnosis not present

## 2015-04-24 DIAGNOSIS — J302 Other seasonal allergic rhinitis: Secondary | ICD-10-CM

## 2015-04-24 NOTE — Progress Notes (Signed)
   Subjective:    Patient ID: Jerry [name], male    DOB: 09-Feb-1956, 59 y.o.   MRN: [id number]  HPI  59 year old male for FU of obstructive sleep apnea. He was first diagnosed in 2001 with very severe OSA, with an AHI greater than 100 events per hour. He lost considerable weight since his original diagnosis. But repeat home study still showed severe OSA with AHI of 35/hour.   Chief Complaint  Patient presents with  . Sleep Apnea    Former [facility] pt; wearing CPAP every night, having trouble with seal on mask, other than that, working well.  no concerns.   67m FU He was provided with a new CPAP on his last visit in 09/2014 He reports good results with this, feels well rested, denies daytime drowsiness, no snoring has been noted by wife. He reports good compliance with this He is on auto settings with nasal pillows 68m Download  04/2015 >> leak ++, no residuals, avg pr 11 cm, excellent usage  Significant tests/ events NPSG 2001 AHI 124/h UPPP 2001 Lost wt ....HST 11/2014 35/h   Review of Systems neg for any significant sore throat, dysphagia, itching, sneezing, nasal congestion or excess/ purulent secretions, fever, chills, sweats, unintended wt loss, pleuritic or exertional cp, hempoptysis, orthopnea pnd or change in chronic leg swelling. Also denies presyncope, palpitations, heartburn, abdominal pain, nausea, vomiting, diarrhea or change in bowel or urinary habits, dysuria,hematuria, rash, arthralgias, visual complaints, headache, numbness weakness or ataxia.     Objective:   Physical Exam  Gen. Pleasant, well-nourished, in no distress ENT - no lesions, no post nasal drip Neck: No JVD, no thyromegaly, no carotid bruits Lungs: no use of accessory muscles, no dullness to percussion, clear without rales or rhonchi  Cardiovascular: Rhythm regular, heart sounds  normal, no murmurs or gallops, no peripheral edema Musculoskeletal: No deformities, no cyanosis or clubbing           Assessment & Plan:

## 2015-04-24 NOTE — Patient Instructions (Signed)
New nasal pillows CPAP supplies will be renewed x 1 year You are on autoCPAP - avg pressure 11 cm

## 2015-04-24 NOTE — Assessment & Plan Note (Signed)
New nasal pillows CPAP supplies will be renewed x 1 year You are on autoCPAP - avg pressure 11 cm  Weight loss encouraged, compliance with goal of at least 4-6 hrs every night is the expectation. Advised against medications with sedative side effects Cautioned against driving when sleepy - understanding that sleepiness will vary on a day to day basis

## 2015-04-26 NOTE — Assessment & Plan Note (Signed)
Takes Claritin as needed

## 2015-05-08 ENCOUNTER — Encounter: Payer: Self-pay | Admitting: Pulmonary Disease

## 2015-05-29 ENCOUNTER — Telehealth: Payer: Self-pay | Admitting: Pulmonary Disease

## 2015-05-29 DIAGNOSIS — G4733 Obstructive sleep apnea (adult) (pediatric): Secondary | ICD-10-CM

## 2015-05-29 NOTE — Telephone Encounter (Signed)
Order was never placed. I have done so. Pt aware. Nothing further needed

## 2015-06-26 ENCOUNTER — Ambulatory Visit (AMBULATORY_SURGERY_CENTER): Payer: Self-pay | Admitting: *Deleted

## 2015-06-26 VITALS — Ht 70.0 in | Wt 197.6 lb

## 2015-06-26 DIAGNOSIS — Z86010 Personal history of colonic polyps: Secondary | ICD-10-CM

## 2015-06-26 NOTE — Progress Notes (Signed)
Denies allergies to eggs or soy products. Denies complications with sedation or anesthesia. Denies O2 use. Denies use of diet or weight loss medications.  Emmi instructions given for colonoscopy.  

## 2015-07-10 ENCOUNTER — Ambulatory Visit (AMBULATORY_SURGERY_CENTER): Payer: 59 | Admitting: Internal Medicine

## 2015-07-10 ENCOUNTER — Encounter: Payer: Self-pay | Admitting: Internal Medicine

## 2015-07-10 VITALS — BP 107/77 | HR 55 | Temp 97.5°F | Resp 17 | Ht 70.0 in | Wt 197.0 lb

## 2015-07-10 DIAGNOSIS — D125 Benign neoplasm of sigmoid colon: Secondary | ICD-10-CM

## 2015-07-10 DIAGNOSIS — Z86010 Personal history of colonic polyps: Secondary | ICD-10-CM

## 2015-07-10 DIAGNOSIS — K635 Polyp of colon: Secondary | ICD-10-CM | POA: Diagnosis not present

## 2015-07-10 MED ORDER — SODIUM CHLORIDE 0.9 % IV SOLN: 500.0000 mL | INTRAVENOUS | Status: DC

## 2015-07-10 NOTE — Op Note (Signed)
[place]  [address]. [place], [id number]   COLONOSCOPY PROCEDURE REPORT  PATIENT: Jerry [name], Jerry [name]  MR#: [id number] BIRTHDATE: Oct 25, 1955 , 45  yrs. old GENDER: male ENDOSCOPIST: Eustace Quail, MD REFERRED DS:[id number] Program Recall PROCEDURE DATE:  07/10/2015 PROCEDURE:   Colonoscopy, surveillance and Colonoscopy with snare polypectomy X I First Screening Colonoscopy - Avg.  risk and is 50 yrs.  old or older - No.  Prior Negative Screening - Now for repeat screening. N/A  History of Adenoma - Now for follow-up colonoscopy & has been > or = to 3 yrs.  Yes hx of adenoma.  Has been 3 or more years since last colonoscopy.  Polyps removed today? Yes ASA CLASS:   Class II INDICATIONS:Surveillance due to prior colonic neoplasia and PH Colon Adenoma. . Index examination 2001 negative. Follow-up examination 2008 with tubular adenomas. Most recent examination September 2013 was 7 subcentimeter tubular adenomas MEDICATIONS: Monitored anesthesia care and Propofol 250 mg IV  DESCRIPTION OF PROCEDURE:   After the risks benefits and alternatives of the procedure were thoroughly explained, informed consent was obtained.  The digital rectal exam revealed no abnormalities of the rectum.   The LB [id number]  endoscope was introduced through the anus and advanced to the cecum, which was identified by both the appendix and ileocecal valve. No adverse events experienced.   The quality of the prep was excellent. (MiraLax was used)  The instrument was then slowly withdrawn as the colon was fully examined. Estimated blood loss is zero unless otherwise noted in this procedure report.    COLON FINDINGS: A single polyp measuring 1 mm in size was found in the sigmoid colon.  A polypectomy was performed with a cold snare. The resection was complete, the polyp tissue was completely retrieved and sent to histology.   The examination was otherwise normal.  Retroflexed  views revealed internal hemorrhoids. The time to cecum = 1.8 Withdrawal time = 9.5   The scope was withdrawn and the procedure completed. COMPLICATIONS: There were no immediate complications.  ENDOSCOPIC IMPRESSION: 1.   Single polyp was found in the sigmoid colon; polypectomy was performed with a cold snare 2.   The examination was otherwise normal  RECOMMENDATIONS: 1. Follow up colonoscopy in 5 years  (history of multiple adenomas)  eSigned:  Eustace Quail, MD 07/10/2015 11:22 AM   cc: The Patient and Abner Greenspan, MD

## 2015-07-10 NOTE — Patient Instructions (Signed)

## 2015-07-10 NOTE — Progress Notes (Signed)
Transferred to recovery room. A/O x3, pleased with MAC.  VSS.  Report to Jill, RN. 

## 2015-07-10 NOTE — Progress Notes (Signed)
Called to room to assist during endoscopic procedure.  Patient ID and intended procedure confirmed with present staff. Received instructions for my participation in the procedure from the performing physician.  

## 2015-07-11 ENCOUNTER — Telehealth: Payer: Self-pay | Admitting: *Deleted

## 2015-07-11 NOTE — Telephone Encounter (Signed)
  Follow up Call-  Call back number 07/10/2015  Post procedure Call Back phone  # [phone]  Permission to leave phone message Yes     Patient questions:  Do you have a fever, pain , or abdominal swelling? No. Pain Score  0 *  Have you tolerated food without any problems? Yes.    Have you been able to return to your normal activities? Yes.    Do you have any questions about your discharge instructions: Diet   No. Medications  No. Follow up visit  No.  Do you have questions or concerns about your Care? No.  Actions: * If pain score is 4 or above: No action needed, pain <4.

## 2015-07-13 ENCOUNTER — Telehealth: Payer: Self-pay | Admitting: Pulmonary Disease

## 2015-07-13 ENCOUNTER — Encounter: Payer: Self-pay | Admitting: Internal Medicine

## 2015-07-13 NOTE — Telephone Encounter (Signed)
Left message with [name] was [facility] to call back

## 2015-07-13 NOTE — Telephone Encounter (Signed)
[facility] calling back

## 2015-07-13 NOTE — Telephone Encounter (Addendum)
Spoke with the pt He states that he was told by [facility] that CPAP mask order was never received  LMTCB for [facility]      Type Date User    General 05/29/2015 11:10 AM MCMICHAEL, SHERRY P        Note   Confirmation received from [facility] at [facility].                       Type Date User   General 05/29/2015 9:49 AM MCMICHAEL, SHERRY P        Note   Staff message to [place] at [facility].                       Type Date User   Provider Comments 05/29/2015 9:45 AM Quincy Simmonds, [facility]        Summary   Provider Comments        Note   Pt needs new supplies and mask   [facility]

## 2015-07-14 NOTE — Telephone Encounter (Signed)
I have sent [facility] a staff message to check on this. Will await message back

## 2015-07-14 NOTE — Telephone Encounter (Signed)
[place] [place] team is placing this order today also   --  Called made spouse aware of above. Nothing further needed

## 2015-07-30 ENCOUNTER — Telehealth: Payer: Self-pay | Admitting: Family Medicine

## 2015-07-30 DIAGNOSIS — Z125 Encounter for screening for malignant neoplasm of prostate: Secondary | ICD-10-CM

## 2015-07-30 DIAGNOSIS — R739 Hyperglycemia, unspecified: Secondary | ICD-10-CM

## 2015-07-30 DIAGNOSIS — Z0000 Encounter for general adult medical examination without abnormal findings: Secondary | ICD-10-CM

## 2015-07-30 NOTE — Telephone Encounter (Signed)
-----   Message from Marchia Bond sent at 07/26/2015  8:29 AM EST ----- Regarding: Cpx labs Mon 11/14 need orders, thanks :-) Please order  future cpx labs for pt's upcoming lab appt. Thanks Aniceto Boss

## 2015-07-31 ENCOUNTER — Other Ambulatory Visit: Payer: 59

## 2015-08-02 ENCOUNTER — Encounter: Payer: 59 | Admitting: Family Medicine

## 2015-08-18 ENCOUNTER — Other Ambulatory Visit: Payer: Self-pay | Admitting: Family Medicine

## 2015-10-09 DIAGNOSIS — H52221 Regular astigmatism, right eye: Secondary | ICD-10-CM | POA: Diagnosis not present

## 2015-10-09 DIAGNOSIS — H524 Presbyopia: Secondary | ICD-10-CM | POA: Diagnosis not present

## 2015-10-09 DIAGNOSIS — H5203 Hypermetropia, bilateral: Secondary | ICD-10-CM | POA: Diagnosis not present

## 2015-11-15 ENCOUNTER — Other Ambulatory Visit: Payer: Self-pay | Admitting: Family Medicine

## 2015-11-15 MED FILL — AMLODIPINE BESYLATE 10 MG T: 10 | 90 days supply | Qty: 90 | Fill #0

## 2015-11-23 DIAGNOSIS — J209 Acute bronchitis, unspecified: Secondary | ICD-10-CM | POA: Diagnosis not present

## 2015-11-23 DIAGNOSIS — R05 Cough: Secondary | ICD-10-CM | POA: Diagnosis not present

## 2016-01-11 DIAGNOSIS — N390 Urinary tract infection, site not specified: Secondary | ICD-10-CM | POA: Diagnosis not present

## 2016-02-21 MED FILL — AMLODIPINE BESYLATE 10 MG T: 10 | 90 days supply | Qty: 90 | Fill #1

## 2016-04-15 ENCOUNTER — Other Ambulatory Visit: Payer: 59

## 2016-04-16 ENCOUNTER — Other Ambulatory Visit: Payer: 59

## 2016-04-17 ENCOUNTER — Other Ambulatory Visit: Payer: 59

## 2016-04-22 ENCOUNTER — Other Ambulatory Visit (INDEPENDENT_AMBULATORY_CARE_PROVIDER_SITE_OTHER): Payer: 59

## 2016-04-22 ENCOUNTER — Ambulatory Visit: Payer: 59 | Admitting: Adult Health

## 2016-04-22 DIAGNOSIS — R739 Hyperglycemia, unspecified: Secondary | ICD-10-CM | POA: Diagnosis not present

## 2016-04-22 DIAGNOSIS — Z125 Encounter for screening for malignant neoplasm of prostate: Secondary | ICD-10-CM

## 2016-04-22 DIAGNOSIS — Z0000 Encounter for general adult medical examination without abnormal findings: Secondary | ICD-10-CM

## 2016-04-22 LAB — CBC WITH DIFFERENTIAL/PLATELET
BASOS ABS: 0 10*3/uL (ref 0.0–0.1)
Basophils Relative: 0.5 % (ref 0.0–3.0)
EOS ABS: 0.5 10*3/uL (ref 0.0–0.7)
Eosinophils Relative: 6.4 % — ABNORMAL HIGH (ref 0.0–5.0)
HCT: 42.6 % (ref 39.0–52.0)
Hemoglobin: 14.8 g/dL (ref 13.0–17.0)
LYMPHS ABS: 2.5 10*3/uL (ref 0.7–4.0)
Lymphocytes Relative: 34.6 % (ref 12.0–46.0)
MCHC: 34.8 g/dL (ref 30.0–36.0)
MCV: 89.3 fl (ref 78.0–100.0)
MONO ABS: 0.6 10*3/uL (ref 0.1–1.0)
Monocytes Relative: 8.3 % (ref 3.0–12.0)
NEUTROS ABS: 3.6 10*3/uL (ref 1.4–7.7)
NEUTROS PCT: 50.2 % (ref 43.0–77.0)
PLATELETS: 207 10*3/uL (ref 150.0–400.0)
RBC: 4.77 Mil/uL (ref 4.22–5.81)
RDW: 14.2 % (ref 11.5–15.5)
WBC: 7.1 10*3/uL (ref 4.0–10.5)

## 2016-04-22 LAB — COMPREHENSIVE METABOLIC PANEL
ALT: 25 U/L (ref 0–53)
AST: 18 U/L (ref 0–37)
Albumin: 4.3 g/dL (ref 3.5–5.2)
Alkaline Phosphatase: 32 U/L — ABNORMAL LOW (ref 39–117)
BILIRUBIN TOTAL: 0.9 mg/dL (ref 0.2–1.2)
BUN: 14 mg/dL (ref 6–23)
CO2: 29 meq/L (ref 19–32)
CREATININE: 0.99 mg/dL (ref 0.40–1.50)
Calcium: 9.6 mg/dL (ref 8.4–10.5)
Chloride: 105 mEq/L (ref 96–112)
GFR: 81.75 mL/min (ref >60.00)
GLUCOSE: 125 mg/dL — AB (ref 70–99)
Potassium: 3.5 mEq/L (ref 3.5–5.1)
SODIUM: 142 meq/L (ref 135–145)
Total Protein: 7 g/dL (ref 6.0–8.3)

## 2016-04-22 LAB — LIPID PANEL
CHOL/HDL RATIO: 4
Cholesterol: 195 mg/dL (ref 0–200)
HDL: 45.4 mg/dL (ref >39.00)
LDL CALC: 113 mg/dL — AB (ref 0–99)
NONHDL: 149.22
TRIGLYCERIDES: 182 mg/dL — AB (ref 0.0–149.0)
VLDL: 36.4 mg/dL (ref 0.0–40.0)

## 2016-04-22 LAB — HEMOGLOBIN A1C: Hgb A1c MFr Bld: 6.1 % (ref 4.6–6.5)

## 2016-04-22 LAB — TSH: TSH: 2.43 u[IU]/mL (ref 0.35–4.50)

## 2016-04-22 LAB — PSA: PSA: 4.31 ng/mL — AB (ref 0.10–4.00)

## 2016-04-24 ENCOUNTER — Ambulatory Visit (INDEPENDENT_AMBULATORY_CARE_PROVIDER_SITE_OTHER): Payer: 59 | Admitting: Family Medicine

## 2016-04-24 ENCOUNTER — Encounter: Payer: Self-pay | Admitting: Family Medicine

## 2016-04-24 VITALS — BP 128/78 | HR 63 | Temp 98.3°F | Ht 68.0 in | Wt 190.8 lb

## 2016-04-24 DIAGNOSIS — R739 Hyperglycemia, unspecified: Secondary | ICD-10-CM

## 2016-04-24 DIAGNOSIS — E785 Hyperlipidemia, unspecified: Secondary | ICD-10-CM

## 2016-04-24 DIAGNOSIS — Z0000 Encounter for general adult medical examination without abnormal findings: Secondary | ICD-10-CM | POA: Diagnosis not present

## 2016-04-24 DIAGNOSIS — R972 Elevated prostate specific antigen [PSA]: Secondary | ICD-10-CM

## 2016-04-24 DIAGNOSIS — I10 Essential (primary) hypertension: Secondary | ICD-10-CM

## 2016-04-24 DIAGNOSIS — Z125 Encounter for screening for malignant neoplasm of prostate: Secondary | ICD-10-CM

## 2016-04-24 MED ORDER — AMLODIPINE BESYLATE 10 MG PO TABS: 10.0000 mg | ORAL_TABLET | Freq: Every day | ORAL | 3 refills | Status: DC

## 2016-04-24 NOTE — Patient Instructions (Addendum)
If you are interested in a shingles/zoster vaccine - call your insurance to check on coverage,( you should not get it within 1 month of other vaccines) , then call us for a prescription  for it to take to a pharmacy that gives the shot , or make a nurse visit to get it here depending on your coverage Get a flu shot this fall  You can try flonase or nasacort nasal spray over the counter during allergy season as needed  Get back to a low glycemic diet and exercise when you can  We will make an appt with Dr Lorelei Pont for shoulder and hand issues Use cold compress on hand and shoulder when you get a chance for 10 minutes at a time   Stop up front for referral to urology for increase in PSA

## 2016-04-24 NOTE — Progress Notes (Signed)
Subjective:    Patient ID: Jerry [name], male    DOB: 1956/08/30, 60 y.o.   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  Has vacation next week   Wt Readings from Last 3 Encounters:  04/24/16 190 lb 12 oz (86.5 kg)  07/10/15 197 lb (89.4 kg)  06/26/15 197 lb 9.6 oz (89.6 kg)  bmi is 29 Down 7 lb - not a lot of change in diet however Not enough exercise-likes to walk when he can (busy)- usually 2-3 d per week at least 40 minutes   Colonoscopy 10/16- hyperplastic polyp Still a 5 y recall due to past polyps  No bowel changes   Hep C/HIV screen-not high risk  Declines   Zoster vaccine -is interested if covered    Flu shot - gets one every fall   Tdap 11/12  bp is stable today  No cp or palpitations or headaches or edema  No side effects to medicines  BP Readings from Last 3 Encounters:  04/24/16 128/78  07/10/15 107/77  04/24/15 130/80      Hx of hyperlipidemia Lab Results  Component Value Date   CHOL 195 04/22/2016   CHOL 185 01/18/2014   CHOL 174 01/18/2013   Lab Results  Component Value Date   HDL 45.40 04/22/2016   HDL 43.50 01/18/2014   HDL 45.30 01/18/2013   Lab Results  Component Value Date   LDLCALC 113 (H) 04/22/2016   LDLCALC 119 (H) 01/18/2014   LDLCALC 114 (H) 01/18/2013   Lab Results  Component Value Date   TRIG 182.0 (H) 04/22/2016   TRIG 113.0 01/18/2014   TRIG 73.0 01/18/2013   Lab Results  Component Value Date   CHOLHDL 4 04/22/2016   CHOLHDL 4 01/18/2014   CHOLHDL 4 01/18/2013   Lab Results  Component Value Date   LDLDIRECT 160.9 09/03/2012   LDLDIRECT 151.0 08/05/2011   LDLDIRECT 115.7 05/01/2010    Hx of hyperglycemia Lab Results  Component Value Date   HGBA1C 6.1 04/22/2016  has been eating more sugar and more carbs (esp lately at camp) - more white bread and bad carbs (not as careful)  This is up from  5.7 Has not checked his blood glucose   Prostate screen Lab Results  Component Value Date   PSA 4.31 (H) 04/22/2016   PSA 2.66 01/18/2014   PSA 2.53 09/03/2012   has had some prostate symptoms  Takes saw palmetto -it helps nocturia  Trouble emptying bladder only at night   Family hx - no prostate cancer known but dad and gf died young of other things  Review of Systems    Review of Systems  Constitutional: Negative for fever, appetite change, fatigue and unexpected weight change.  Eyes: Negative for pain and visual disturbance.  Respiratory: Negative for cough and shortness of breath.   Cardiovascular: Negative for cp or palpitations    Gastrointestinal: Negative for nausea, diarrhea and constipation.  Genitourinary: Negative for urgency and frequency.  Skin: Negative for pallor or rash   MSK pos for L shoulder pain and L hand (thumb) pain - ? If injury (cannot take nsaids) Neurological: Negative for weakness, light-headedness, numbness and headaches.  Hematological: Negative for adenopathy. Does not bruise/bleed easily.  Psychiatric/Behavioral: Negative for dysphoric mood. The patient is not nervous/anxious.      Objective:   Physical  Exam  Constitutional: He appears well-developed and well-nourished. No distress.  overwt and well app  HENT:  Head: Normocephalic and atraumatic.  Right Ear: External ear normal.  Left Ear: External ear normal.  Nose: Nose normal.  Mouth/Throat: Oropharynx is clear and moist.  Eyes: Conjunctivae and EOM are normal. Pupils are equal, round, and reactive to light. Right eye exhibits no discharge. Left eye exhibits no discharge. No scleral icterus.  Neck: Normal range of motion. Neck supple. No JVD present. Carotid bruit is not present. No thyromegaly present.  Cardiovascular: Normal rate, regular rhythm, normal heart sounds and intact distal pulses.  Exam reveals no gallop.   Pulmonary/Chest: Effort normal and breath sounds normal. No  respiratory distress. He has no wheezes. He exhibits no tenderness.  Abdominal: Soft. Bowel sounds are normal. He exhibits no distension, no abdominal bruit and no mass. There is no tenderness.  Musculoskeletal: He exhibits no edema or tenderness.  Limited rom of L shoulder No joint swelling   Lymphadenopathy:    He has no cervical adenopathy.  Neurological: He is alert. He has normal reflexes. No cranial nerve deficit. He exhibits normal muscle tone. Coordination normal.  Skin: Skin is warm and dry. No rash noted. No erythema. No pallor.  Psychiatric: He has a normal mood and affect.          Assessment & Plan:   Problem List Items Addressed This Visit      Cardiovascular and Mediastinum   Essential hypertension    bp in fair control at this time  BP Readings from Last 1 Encounters:  04/24/16 128/78   No changes needed Disc lifstyle change with low sodium diet and exercise  Labs reviewed       Relevant Medications   amLODipine (NORVASC) 10 MG tablet     Other   Routine general medical examination at a health care facility - Primary    Reviewed health habits including diet and exercise and skin cancer prevention Reviewed appropriate screening tests for age  Also reviewed health mt list, fam hx and immunization status , as well as social and family history   See HPI Labs reviewed If you are interested in a shingles/zoster vaccine - call your insurance to check on coverage,( you should not get it within 1 month of other vaccines) , then call us for a prescription  for it to take to a pharmacy that gives the shot , or make a nurse visit to get it here depending on your coverage Get a flu shot this fall  You can try flonase or nasacort nasal spray over the counter during allergy season as needed  Get back to a low glycemic diet and exercise when you can  We will make an appt with Dr Lorelei Pont for shoulder and hand issues Use cold compress on hand and shoulder when you get a  chance for 10 minutes at a time   Stop up front for referral to urology for increase in PSA      Prostate cancer screening    Elevated psa-pt also has BPH symptoms Ref to urol       Hyperlipidemia    Disc goals for lipids and reasons to control them Rev labs with pt Rev low sat fat diet in detail       Relevant Medications   amLODipine (NORVASC) 10 MG tablet   Hyperglycemia    Lab Results  Component Value Date   HGBA1C 6.1 04/22/2016   This is stable Disc  imp of low glycemic diet and wt control to prevent DM      Elevated PSA    Lab Results  Component Value Date   PSA 4.31 (H) 04/22/2016   PSA 2.66 01/18/2014   PSA 2.53 09/03/2012   Ref to urology Does take Saw Palmetto for BPH symptoms       Relevant Orders   Ambulatory referral to Urology    Other Visit Diagnoses   None.

## 2016-04-24 NOTE — Progress Notes (Signed)
Pre visit review using our clinic review tool, if applicable. No additional management support is needed unless otherwise documented below in the visit note. 

## 2016-04-25 NOTE — Assessment & Plan Note (Signed)
Lab Results  Component Value Date   HGBA1C 6.1 04/22/2016   This is stable Disc imp of low glycemic diet and wt control to prevent DM

## 2016-04-25 NOTE — Assessment & Plan Note (Signed)
Reviewed health habits including diet and exercise and skin cancer prevention Reviewed appropriate screening tests for age  Also reviewed health mt list, fam hx and immunization status , as well as social and family history   See HPI Labs reviewed If you are interested in a shingles/zoster vaccine - call your insurance to check on coverage,( you should not get it within 1 month of other vaccines) , then call us for a prescription  for it to take to a pharmacy that gives the shot , or make a nurse visit to get it here depending on your coverage Get a flu shot this fall  You can try flonase or nasacort nasal spray over the counter during allergy season as needed  Get back to a low glycemic diet and exercise when you can  We will make an appt with Dr Lorelei Pont for shoulder and hand issues Use cold compress on hand and shoulder when you get a chance for 10 minutes at a time   Stop up front for referral to urology for increase in PSA

## 2016-04-25 NOTE — Assessment & Plan Note (Signed)
Disc goals for lipids and reasons to control them Rev labs with pt Rev low sat fat diet in detail   

## 2016-04-25 NOTE — Assessment & Plan Note (Signed)
Lab Results  Component Value Date   PSA 4.31 (H) 04/22/2016   PSA 2.66 01/18/2014   PSA 2.53 09/03/2012   Ref to urology Does take [facility] for BPH symptoms

## 2016-04-25 NOTE — Assessment & Plan Note (Signed)
bp in fair control at this time  BP Readings from Last 1 Encounters:  04/24/16 128/78   No changes needed Disc lifstyle change with low sodium diet and exercise  Labs reviewed

## 2016-04-25 NOTE — Assessment & Plan Note (Signed)
Elevated psa-pt also has BPH symptoms Ref to [facility]

## 2016-05-29 MED FILL — AMLODIPINE BESYLATE 10 MG T: 10 | 90 days supply | Qty: 90 | Fill #0

## 2016-06-04 DIAGNOSIS — I10 Essential (primary) hypertension: Secondary | ICD-10-CM | POA: Diagnosis not present

## 2016-06-04 DIAGNOSIS — R300 Dysuria: Secondary | ICD-10-CM | POA: Diagnosis not present

## 2016-06-20 DIAGNOSIS — R972 Elevated prostate specific antigen [PSA]: Secondary | ICD-10-CM | POA: Diagnosis not present

## 2016-06-20 MED FILL — CIPROFLOXACIN HCL 500 MG TA: 500 | 30 days supply | Qty: 60 | Fill #0

## 2016-06-20 MED FILL — TAMSULOSIN HCL 0.4 MG CAP: 0.4 | 30 days supply | Qty: 30 | Fill #0

## 2016-06-20 MED FILL — MELOXICAM 15 MG TABLET: 15 | 30 days supply | Qty: 30 | Fill #0

## 2016-07-22 DIAGNOSIS — R972 Elevated prostate specific antigen [PSA]: Secondary | ICD-10-CM | POA: Diagnosis not present

## 2016-08-02 DIAGNOSIS — R972 Elevated prostate specific antigen [PSA]: Secondary | ICD-10-CM | POA: Diagnosis not present

## 2016-08-30 MED FILL — AMLODIPINE BESYLATE 10 MG T: 10 | 90 days supply | Qty: 90 | Fill #1

## 2016-10-13 ENCOUNTER — Telehealth: Payer: Self-pay | Admitting: Family Medicine

## 2016-10-13 DIAGNOSIS — I10 Essential (primary) hypertension: Secondary | ICD-10-CM

## 2016-10-13 DIAGNOSIS — Z0000 Encounter for general adult medical examination without abnormal findings: Secondary | ICD-10-CM

## 2016-10-13 DIAGNOSIS — R739 Hyperglycemia, unspecified: Secondary | ICD-10-CM

## 2016-10-13 DIAGNOSIS — E7800 Pure hypercholesterolemia, unspecified: Secondary | ICD-10-CM

## 2016-10-13 NOTE — Telephone Encounter (Signed)
-----   Message from Ellamae Sia sent at 10/11/2016  3:05 PM EST ----- Regarding: Lab orders for Monday 2.5.18 Lab orders for a 6 month follow up appt

## 2016-10-21 ENCOUNTER — Other Ambulatory Visit: Payer: 59

## 2016-10-25 ENCOUNTER — Ambulatory Visit: Payer: 59 | Admitting: Family Medicine

## 2016-11-01 ENCOUNTER — Ambulatory Visit: Payer: 59 | Admitting: Family Medicine

## 2016-11-01 ENCOUNTER — Telehealth: Payer: Self-pay | Admitting: Family Medicine

## 2016-11-01 NOTE — Telephone Encounter (Signed)
Please f/u at his convenience

## 2016-11-01 NOTE — Telephone Encounter (Signed)
Patient did not come in for their appointment todayfor 6 month follow up  Please let me know if patient needs to be contacted immediately for follow up or no follow up needed. °

## 2016-11-07 DIAGNOSIS — H5203 Hypermetropia, bilateral: Secondary | ICD-10-CM | POA: Diagnosis not present

## 2016-11-07 DIAGNOSIS — H52213 Irregular astigmatism, bilateral: Secondary | ICD-10-CM | POA: Diagnosis not present

## 2016-11-07 DIAGNOSIS — H524 Presbyopia: Secondary | ICD-10-CM | POA: Diagnosis not present

## 2016-12-02 MED FILL — AMLODIPINE BESYLATE 10 MG T: 10 | 90 days supply | Qty: 90 | Fill #2

## 2016-12-17 ENCOUNTER — Emergency Department: Payer: 59

## 2016-12-17 ENCOUNTER — Encounter: Payer: Self-pay | Admitting: Emergency Medicine

## 2016-12-17 DIAGNOSIS — R0602 Shortness of breath: Secondary | ICD-10-CM | POA: Diagnosis not present

## 2016-12-17 DIAGNOSIS — Z87891 Personal history of nicotine dependence: Secondary | ICD-10-CM | POA: Diagnosis not present

## 2016-12-17 DIAGNOSIS — I10 Essential (primary) hypertension: Secondary | ICD-10-CM | POA: Diagnosis not present

## 2016-12-17 DIAGNOSIS — J45909 Unspecified asthma, uncomplicated: Secondary | ICD-10-CM | POA: Insufficient documentation

## 2016-12-17 DIAGNOSIS — E119 Type 2 diabetes mellitus without complications: Secondary | ICD-10-CM | POA: Insufficient documentation

## 2016-12-17 DIAGNOSIS — Z79899 Other long term (current) drug therapy: Secondary | ICD-10-CM | POA: Diagnosis not present

## 2016-12-17 DIAGNOSIS — E876 Hypokalemia: Secondary | ICD-10-CM | POA: Diagnosis not present

## 2016-12-17 DIAGNOSIS — R0789 Other chest pain: Secondary | ICD-10-CM | POA: Diagnosis not present

## 2016-12-17 LAB — BASIC METABOLIC PANEL
Anion gap: 9 (ref 5–15)
BUN: 17 mg/dL (ref 6–20)
CALCIUM: 10 mg/dL (ref 8.9–10.3)
CO2: 28 mmol/L (ref 22–32)
CREATININE: 1.04 mg/dL (ref 0.61–1.24)
Chloride: 104 mmol/L (ref 101–111)
GFR calc non Af Amer: >60 mL/min (ref >60)
Glucose, Bld: 157 mg/dL — ABNORMAL HIGH (ref 65–99)
Potassium: 3.1 mmol/L — ABNORMAL LOW (ref 3.5–5.1)
SODIUM: 141 mmol/L (ref 135–145)

## 2016-12-17 LAB — CBC
HCT: 42.9 % (ref 40.0–52.0)
Hemoglobin: 15.1 g/dL (ref 13.0–18.0)
MCH: 31.7 pg (ref 26.0–34.0)
MCHC: 35.1 g/dL (ref 32.0–36.0)
MCV: 90.3 fL (ref 80.0–100.0)
PLATELETS: 218 10*3/uL (ref 150–440)
RBC: 4.75 MIL/uL (ref 4.40–5.90)
RDW: 14.2 % (ref 11.5–14.5)
WBC: 9.2 10*3/uL (ref 3.8–10.6)

## 2016-12-17 LAB — TROPONIN I

## 2016-12-17 NOTE — ED Triage Notes (Signed)
Pt to triage via w/c with no distress noted; [place] sudden onset chest pressure and SHOB; pt denies hx of same

## 2016-12-18 ENCOUNTER — Emergency Department
Admission: EM | Admit: 2016-12-18 | Discharge: 2016-12-18 | Disposition: A | Discharge status: Home / Self Care | Payer: 59 | Attending: Emergency Medicine | Admitting: Emergency Medicine

## 2016-12-18 DIAGNOSIS — E876 Hypokalemia: Secondary | ICD-10-CM

## 2016-12-18 DIAGNOSIS — R079 Chest pain, unspecified: Secondary | ICD-10-CM

## 2016-12-18 LAB — TROPONIN I: Troponin I: <0.03 ng/mL (ref <0.03)

## 2016-12-18 MED ORDER — POTASSIUM CHLORIDE CRYS ER 20 MEQ PO TBCR
40.0000 meq | EXTENDED_RELEASE_TABLET | Freq: Once | ORAL | Status: AC
  Administered 2016-12-18: 40 meq via ORAL
  Filled 2016-12-18: qty 2

## 2016-12-18 NOTE — ED Notes (Signed)
Pt discharged to home.  Family member driving.  Discharge instructions reviewed.  Verbalized understanding.  No questions or concerns at this time.  Teach back verified.  Pt in NAD.  No items left in ED.   

## 2016-12-18 NOTE — Discharge Instructions (Signed)
You have been seen in the Emergency Department (ED) today for chest pain.  As we have discussed today?s test results are normal, but you may require further testing.  Remember to take four 81mg  coated aspirin when you get home.  Your potassium was slightly low today, which may be unrelated to your chest pain, but we gave you a supplement tonight and a prescription for a week's work of additional supplements.  Please discuss this with your cardiologist when you follow up.  Please follow up with the recommended doctor as instructed above in these documents regarding today?s emergent visit and your recent symptoms to discuss further management.  Continue to take your regular medications. If you are not doing so already, please also take a daily baby aspirin (81 mg), at least until you follow up with your doctor.  Return to the Emergency Department (ED) if you experience any further chest pain/pressure/tightness, difficulty breathing, or sudden sweating, or other symptoms that concern you.

## 2016-12-18 NOTE — ED Provider Notes (Signed)
[facility] Emergency Department Provider Note  ____________________________________________   First MD Initiated Contact with Patient 12/18/16 0141     (approximate)  I have reviewed the triage vital signs and the nursing notes.   HISTORY  Chief Complaint Chest Pain    HPI Jerry [name] is a 61 y.o. male with medical history as listed below who presents for evaluation of acute onset chest discomfort.  He had finished bowling with his family tonight and was returning his shoes when he had acute onset central chest pressure with some accompanying shortness of breath.  The patient says that the episode lasted about 5 minutes.  It felt moderate in severity but it scared him because he has not had similar episodes in the past.  Nothing in particular made it better or worse but it resolved on its own.  He has had no additional discomfort since being in the emergency department.  He denies any recent illnesses.  He denies fever/chills, nausea, vomiting, abdominal pain, diarrhea.  He reports having only high blood pressure but his medical history indicates diabetes.  He does not take any medication for the diabetes if in fact he has this diagnosis.  He denies high cholesterol and does not smoke tobacco.  He has no first-degree relatives who have had ACS/MI.  He has had no recent travel or immobilizations.   Past Medical History:  Diagnosis Date  . Allergic rhinitis, cause unspecified   . Asthma   . Diabetes mellitus without complication ([place])   . Elbow fracture age 89 or 33   left  . Hx of colonic polyps   . Other and unspecified hyperlipidemia   . Unspecified essential hypertension     Patient Active Problem List   Diagnosis Date Noted  . Elevated PSA 04/24/2016  . Routine general medical examination at a health care facility 08/05/2011  . Prostate cancer screening 08/05/2011  . TINNITUS 09/06/2009  . Hyperglycemia 08/21/2009  . COLONIC POLYPS, HX OF  01/16/2009  . Obstructive sleep apnea 10/26/2008  . Hyperlipidemia 03/11/2007  . Essential hypertension 03/11/2007  . Allergic rhinitis 03/11/2007  . ASTHMA 03/11/2007    Past Surgical History:  Procedure Laterality Date  . 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 CHOLECYSTECTOMY    . KIDNEY STONE SURGERY    . left leg fracture      Prior to Admission medications   Medication Sig Start Date End Date Taking? Authorizing Provider  amLODipine (NORVASC) 10 MG tablet Take 1 tablet (10 mg total) by mouth daily. 04/24/16   Abner Greenspan, MD  loratadine (CLARITIN) 10 MG tablet Take 10 mg by mouth daily.     Historical Provider, MD  Multiple Vitamin (MULTIVITAMIN) capsule Take 1 capsule by mouth daily.      Historical Provider, MD    Allergies Fluticasone propionate  Family History  Problem Relation Age of Onset  . Lung cancer Father   . Diabetes Mother   . Pancreatic cancer Paternal Grandfather   . Colon cancer Neg Hx     Social History Social History  Substance Use Topics  . Smoking status: Former Smoker    Packs/day: 0.50    Years: 3.00    Types: Cigarettes    Quit date: 09/16/1972  . Smokeless tobacco: Never Used  . Alcohol use No    Review of Systems Constitutional: No fever/chills Eyes: No visual changes. ENT: No sore throat. Cardiovascular: acute onset central chest discomfort Respiratory: Mild shortness  of breath associated with the chest discomfort Gastrointestinal: No abdominal pain.  No nausea, no vomiting.  No diarrhea.  No constipation. Genitourinary: Negative for dysuria. Musculoskeletal: Negative for back pain. Skin: Negative for rash. Neurological: Negative for headaches, focal weakness or numbness.  10-point ROS otherwise negative.  ____________________________________________   PHYSICAL EXAM:  VITAL SIGNS: ED Triage Vitals  Enc Vitals Group     BP 12/17/16 2117 (!) 158/92     Pulse Rate 12/17/16 2117 74     Resp 12/17/16 2117 18      Temp 12/17/16 2117 97.7 F (36.5 C)     Temp Source 12/17/16 2117 Oral     SpO2 12/17/16 2117 97 %     Weight 12/17/16 2116 200 lb (90.7 kg)     Height 12/17/16 2116 5\' 9"  (1.753 m)     Head Circumference --      Peak Flow --      Pain Score 12/17/16 2116 3     Pain Loc --      Pain Edu? --      Excl. in [place]? --     Constitutional: Alert and oriented. Well appearing and in no acute distress. Eyes: Conjunctivae are normal. PERRL. EOMI. Head: Atraumatic. Nose: No congestion/rhinnorhea. Mouth/Throat: Mucous membranes are moist. Neck: No stridor.  No meningeal signs.   Cardiovascular: Normal rate, regular rhythm. Good peripheral circulation. Grossly normal heart sounds. Respiratory: Normal respiratory effort.  No retractions. Lungs CTAB. Gastrointestinal: Soft and nontender. No distention.  Musculoskeletal: No lower extremity tenderness nor edema. No gross deformities of extremities. Neurologic:  Normal speech and language. No gross focal neurologic deficits are appreciated.  Skin:  Skin is warm, dry and intact. No rash noted. Psychiatric: Mood and affect are normal. Speech and behavior are normal.  ____________________________________________   LABS (all labs ordered are listed, but only abnormal results are displayed)  Labs Reviewed  BASIC METABOLIC PANEL - Abnormal; Notable for the following:       Result Value   Potassium 3.1 (*)    Glucose, Bld 157 (*)    All other components within normal limits  CBC  TROPONIN I  TROPONIN I   ____________________________________________  EKG  ED ECG REPORT I, Awanda Wilcock, the attending physician, personally viewed and interpreted this ECG.  Date: 12/17/2016 EKG Time: 21:20 Rate: 72 Rhythm: normal sinus rhythm QRS Axis: normal Intervals: normal ST/T Wave abnormalities: Inverted T-wave in lead 3, otherwise unremarkable Conduction Disturbances: none Narrative Interpretation: Non-specific ST segment / T-wave changes, but no  evidence of acute ischemia.   ____________________________________________  RADIOLOGY   Dg Chest 2 View  Result Date: 12/17/2016 CLINICAL DATA:  Shortness of breath and chest pressure sensation EXAM: CHEST  2 VIEW COMPARISON:  September 28, 2011 FINDINGS: There is no edema or consolidation. The heart size and pulmonary vascularity are normal. No adenopathy. There is atherosclerotic calcification in the aorta. No pneumothorax. There is mild degenerative change in the thoracic spine. IMPRESSION: No edema or consolidation.  There is aortic atherosclerosis. Electronically Signed   By: Lowella Grip III M.D.   On: 12/17/2016 21:52    ____________________________________________   PROCEDURES  Critical Care performed: No   Procedure(s) performed:   Procedures   ____________________________________________   INITIAL IMPRESSION / ASSESSMENT AND PLAN / ED COURSE  Pertinent labs & imaging results that were available during my care of the patient were reviewed by me and considered in my medical decision making (see chart for details).  HEART score  3-4 based on subjective assessment of moderately vs highly suspicious history.  However the patient has been completely asymptomatic since coming here and I had my usual and customary ACS spectrum discussion with the patient and his wife.  They are both comfortable with the plan for close outpatient follow-up with cardiology and I think this is appropriate for him.  I offered a full dose aspirin but he stated that he has coated aspirin at home and would rather take that because it has messed up his stomach in the past.  He promises he will take four 81 mg aspirin when he gets home.  I gave him my usual and customary return precautions and information about with whom to follow up.  He and His wife understand and agree with the plan.   Clinical Course as of Dec 18 217  Wed Dec 18, 2016  0217 Potassium slighly low, gave 40 meq and a short  prescription for supplement.  [CF]    Clinical Course User Index [CF] Hinda Kehr, MD    ____________________________________________  FINAL CLINICAL IMPRESSION(S) / ED DIAGNOSES  Final diagnoses:  Chest pain, unspecified type  Hypokalemia     MEDICATIONS GIVEN DURING THIS VISIT:  Medications  potassium chloride SA (K-DUR,KLOR-CON) CR tablet 40 mEq (not administered)     NEW OUTPATIENT MEDICATIONS STARTED DURING THIS VISIT:  New Prescriptions   No medications on file    Modified Medications   No medications on file    Discontinued Medications   No medications on file     Note:  This document was prepared using Dragon voice recognition software and may include unintentional dictation errors.    Hinda Kehr, MD 12/18/16 [phone]

## 2017-01-06 DIAGNOSIS — I700 Atherosclerosis of aorta: Secondary | ICD-10-CM | POA: Insufficient documentation

## 2017-01-06 NOTE — Progress Notes (Signed)
Cardiology Office Note  Date:  01/07/2017   ID:  Jerry [name], Jerry [name] 01/27/1956, MRN [id number]  PCP:  Loura Pardon, MD   Chief Complaint  Patient presents with  . other    hospital follow up. Patient denies chest pain and SOB. Meds reviewed verbally with patient.     HPI:  [name] is a 61 year old gentleman with history of Former smoker,  HTN,  hyperlipidemia,  borderline dm (HBA1C 6.1) Seen in the ER for chest pain 12/18/2016 Who presents by referral from Dr. Karma Greaser for consultation of his chest pain symptoms  He reports that 12/18/2016, he had  finished bowling with his family  returning his shoes when he had acute onset central chest pressure with shortness of breath.   episode lasted about 5 minutes.  No radiation of his pain moderate in severity  Reports pain went away when he sat in the car on the way to the emergency room He did develop a headache no additional discomfort  in the emergency department.    Emergency records reviewed EKG with normal sinus rhythm borderline Q waves inferior leads concerning for old inferior MI Troponin negative 2 BMP with potassium 3.1  Chest x-ray showing aortic atherosclerosis.  Total cholesterol 195  Since the event several weeks ago he has tried to push himself aerobically, done exercise, heavy activity in an effort to reproduce his pain. He denies any recurrent symptoms, only some mild shortness of breath with heavy exertion  EKG personally reviewed by myself on todays visit Normal sinus rhythm with rate 61 bpm, Nonspecific Q wave inferior leads, PVCs   PMH:   has a past medical history of Allergic rhinitis, cause unspecified; Asthma; Diabetes mellitus without complication ([place]); Elbow fracture (age 29 or 22); colonic polyps; Other and unspecified hyperlipidemia; and Unspecified essential hypertension.  PSH:    Past Surgical History:  Procedure Laterality Date  . 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  CHOLECYSTECTOMY    . KIDNEY STONE SURGERY    . left leg fracture      Current Outpatient Prescriptions  Medication Sig Dispense Refill  . amLODipine (NORVASC) 10 MG tablet Take 1 tablet (10 mg total) by mouth daily. 90 tablet 3  . loratadine (CLARITIN) 10 MG tablet Take 10 mg by mouth daily.     . Multiple Vitamin (MULTIVITAMIN) capsule Take 1 capsule by mouth daily.       No current facility-administered medications for this visit.      Allergies:   Fluticasone propionate   Social History:  The patient  reports that he quit smoking about 44 years ago. His smoking use included Cigarettes. He has a 1.50 pack-year smoking history. He has never used smokeless tobacco. He reports that he does not drink alcohol or use drugs.   Family History:   family history includes Diabetes in his mother; Lung cancer in his father; Pancreatic cancer in his paternal grandfather.    Review of Systems: Review of Systems  Constitutional: Negative.   Respiratory: Negative.   Cardiovascular: Positive for chest pain.  Gastrointestinal: Negative.   Musculoskeletal: Negative.   Neurological: Negative.   Psychiatric/Behavioral: Negative.   All other systems reviewed and are negative.    PHYSICAL EXAM: VS:  BP (!) 154/82 (BP Location: Right Arm, Patient Position: Sitting, Cuff Size: Normal)   Pulse 61   Ht 5\' 9"  (1.753 m)   Wt 206 lb 8 oz (93.7 kg)   BMI 30.49 kg/m  , BMI  Body mass index is 30.49 kg/m. GEN: Well nourished, well developed, in no acute distress , obese HEENT: normal  Neck: no JVD, carotid bruits, or masses Cardiac: RRR; no murmurs, rubs, or gallops,no edema  Respiratory:  clear to auscultation bilaterally, normal work of breathing GI: soft, nontender, nondistended, + BS MS: no deformity or atrophy  Skin: warm and dry, no rash Neuro:  Strength and sensation are intact Psych: euthymic mood, full affect    Recent Labs: 04/22/2016: ALT 25; TSH 2.43 12/17/2016: BUN 17; Creatinine,  Ser 1.04; Hemoglobin 15.1; Platelets 218; Potassium 3.1; Sodium 141    Lipid Panel Lab Results  Component Value Date   CHOL 195 04/22/2016   HDL 45.40 04/22/2016   LDLCALC 113 (H) 04/22/2016   TRIG 182.0 (H) 04/22/2016      Wt Readings from Last 3 Encounters:  01/07/17 206 lb 8 oz (93.7 kg)  12/17/16 200 lb (90.7 kg)  04/24/16 190 lb 12 oz (86.5 kg)       ASSESSMENT AND PLAN:  chest pain - Long discussion concerning recent events Atypical symptoms lasting 5 minutes, resolved without further intervention  no recurrent symptoms since that time even with exertion/exercise Few risk factors, borderline diabetes, remote smoking, borderline hyperlipidemia Discussed for his treatment options with him including medical management with further monitoring Also discussed various types of imaging to rule out underlying coronary disease as a cause of his symptoms Imaging studies include CT coronary calcium score, nuclear stress testing After much discussion, he might be interested in CT coronary calcium scoring Order has been placed, directions provided He will talk to his wife whether this will be some that he would like to do He will do some research We have recommended he call us for any further symptoms  Aortic atherosclerosis ([place]) - Plan: EKG 12-Lead, CT CARDIAC SCORING Seen on chest x-ray  Discussed implications of this  This could be better visualized on CT coronary calcium scoring   Essential hypertension - Plan: EKG 12-Lead, CT CARDIAC SCORING Blood pressure mildly elevated today, likely situational  He will monitor blood pressure at home   Mixed hyperlipidemia - Plan: EKG 12-Lead, CT CARDIAC SCORING Borderline cholesterol suggested he use imaging studies detailed above to help guide his cholesterol management   if he has elevated calcium score, would be more aggressive, would start a statin  Abnormal EKG  borderline Q waves inferiorly leads , nonspecific   Former  smoker  stop smoking many years ago   Borderline diabetes We have encouraged continued exercise, careful diet management in an effort to lose weight.  Disposition:   F/U  As needed  Patient was seen in consultation today for issues detailed above and will be referred back to Dr. Glori Bickers for ongoing care of his medical issues   Orders Placed This Encounter  Procedures  . CT CARDIAC SCORING  . EKG 12-Lead     Signed, Esmond Plants, M.D., Ph.D. 01/07/2017  [place], [place]

## 2017-01-07 ENCOUNTER — Encounter: Payer: Self-pay | Admitting: Cardiovascular Disease

## 2017-01-07 ENCOUNTER — Ambulatory Visit (INDEPENDENT_AMBULATORY_CARE_PROVIDER_SITE_OTHER): Payer: 59 | Admitting: Cardiovascular Disease

## 2017-01-07 VITALS — BP 154/82 | HR 61 | Ht 69.0 in | Wt 206.5 lb

## 2017-01-07 DIAGNOSIS — Z87891 Personal history of nicotine dependence: Secondary | ICD-10-CM

## 2017-01-07 DIAGNOSIS — R7303 Prediabetes: Secondary | ICD-10-CM

## 2017-01-07 DIAGNOSIS — I700 Atherosclerosis of aorta: Secondary | ICD-10-CM

## 2017-01-07 DIAGNOSIS — R9431 Abnormal electrocardiogram [ECG] [EKG]: Secondary | ICD-10-CM

## 2017-01-07 DIAGNOSIS — R0789 Other chest pain: Secondary | ICD-10-CM | POA: Diagnosis not present

## 2017-01-07 DIAGNOSIS — I10 Essential (primary) hypertension: Secondary | ICD-10-CM | POA: Diagnosis not present

## 2017-01-07 DIAGNOSIS — E782 Mixed hyperlipidemia: Secondary | ICD-10-CM | POA: Diagnosis not present

## 2017-01-07 DIAGNOSIS — E119 Type 2 diabetes mellitus without complications: Secondary | ICD-10-CM | POA: Insufficient documentation

## 2017-01-07 NOTE — Patient Instructions (Addendum)
Medication Instructions:   For acid: tums tums mixed with pepcid/zantac When bad, omeprazole (prevacid)  Labwork:  No new labs needed  Testing/Procedures:  We will schedule a CT coronary calcium score  If score is elevated, we may need to treat cholesterol  [address], [place] Please call [phone] to schedule at your convenience There is a one-time fee of $150 due at the time of your procedure   Follow-Up: It was a pleasure seeing you in the office today. Please call us if you have new issues that need to be addressed before your next appt.  [phone]  Your physician wants you to follow-up in: 6 months.  You will receive a reminder letter in the mail two months in advance. If you don't receive a letter, please call our office to schedule the follow-up appointment.  If you need a refill on your cardiac medications before your next appointment, please call your pharmacy.     Coronary Calcium Scan A coronary calcium scan is an imaging test used to look for deposits of calcium and other fatty materials (plaques) in the inner lining of the blood vessels of the heart (coronary arteries). These deposits of calcium and plaques can partly clog and narrow the coronary arteries without producing any symptoms or warning signs. This puts a person at risk for a heart attack. This test can detect these deposits before symptoms develop. Tell a health care provider about:  Any allergies you have.  All medicines you are taking, including vitamins, herbs, eye drops, creams, and over-the-counter medicines.  Any problems you or family members have had with anesthetic medicines.  Any blood disorders you have.  Any surgeries you have had.  Any medical conditions you have.  Whether you are pregnant or may be pregnant. What are the risks? Generally, this is a safe procedure. However, problems may occur, including:  Harm to a pregnant woman and her unborn  baby. This test involves the use of radiation. Radiation exposure can be dangerous to a pregnant woman and her unborn baby. If you are pregnant, you generally should not have this procedure done.  Slight increase in the risk of cancer. This is because of the radiation involved in the test. What happens before the procedure? No preparation is needed for this procedure. What happens during the procedure?  You will undress and remove any jewelry around your neck or chest.  You will put on a hospital gown.  Sticky electrodes will be placed on your chest. The electrodes will be connected to an electrocardiogram (ECG) machine to record a tracing of the electrical activity of your heart.  A CT scanner will take pictures of your heart. During this time, you will be asked to lie still and hold your breath for 2-3 seconds while a picture of your heart is being taken. The procedure may vary among health care providers and hospitals. What happens after the procedure?  You can get dressed.  You can return to your normal activities.  It is up to you to get the results of your test. Ask your health care provider, or the department that is doing the test, when your results will be ready. Summary  A coronary calcium scan is an imaging test used to look for deposits of calcium and other fatty materials (plaques) in the inner lining of the blood vessels of the heart (coronary arteries).  Generally, this is a safe procedure. Tell your health care provider if you are pregnant or  may be pregnant.  No preparation is needed for this procedure.  A CT scanner will take pictures of your heart.  You can return to your normal activities after the scan is done. This information is not intended to replace advice given to you by your health care provider. Make sure you discuss any questions you have with your health care provider. Document Released: 02/29/2008 Document Revised: 07/22/2016 Document Reviewed:  07/22/2016 Elsevier Interactive Patient Education  2017 [employer].

## 2017-03-12 MED FILL — AMLODIPINE BESYLATE 10 MG T: 10 | 90 days supply | Qty: 90 | Fill #3

## 2017-06-04 ENCOUNTER — Telehealth: Payer: Self-pay

## 2017-06-04 NOTE — Telephone Encounter (Signed)
Pt and his wife received an offer for [facility] Screening through the mail. He wanted to know what Dr Glori Bickers thought about it.

## 2017-06-04 NOTE — Telephone Encounter (Signed)
I don't think it is necessary  It involves a list of secondary screening tests that do not meet the qualifications to be recommended for everyone  However if he wants to do it - it won't hurt - we would investigate any positive results

## 2017-06-05 NOTE — Telephone Encounter (Signed)
Pt notified of Dr. Tower's comments and verbalized understanding  

## 2017-06-07 NOTE — Progress Notes (Signed)
Cardiology Office Note  Date:  06/09/2017   ID:  Bing, Duffey Mar 19, 1956, MRN [id number]  PCP:  Abner Greenspan, MD   Chief Complaint  Patient presents with  . other    Pt. c/o feeling fatigue and decreased heart rate. Meds reviewed by the pt. verbally.     HPI:  Mr Knaus is a 61 year old gentleman with history of Former smoker, teenager only Aortic athero on CXR 12/2016 HTN,  pVCs on EKG hyperlipidemia,  borderline dm (HBA1C 6.1) Seen in the ER for chest pain 12/18/2016 OSA uses CPAP Who presents for follow up of his chest pain symptoms  CT coronary calcium score ordered, did not do the test On today's visit he reports having SOB, "trouble getting heart rate to speed up" Does yard work,  No regular exercise Walking up yard, "heart does a weird beat, stomach 1/2 turns,  Reports he has had several episodes  Scheduled to have lifestyle screening studies done next month  EKG personally reviewed by myself on todays visit Shows sinus bradycardia rate 49 bpm no significant ST or T-wave changes Previous EKG with PVCs  Other past medical history reviewed He reports that 12/18/2016, he had  finished bowling with his family  returning his shoes when he had acute onset central chest pressure with shortness of breath.   episode lasted about 5 minutes.  No radiation of his pain moderate in severity  Reports pain went away when he sat in the car on the way to the emergency room He did develop a headache no additional discomfort  in the emergency department.    In the emergency room EKG with normal sinus rhythm borderline Q waves inferior leads concerning for old inferior MI Troponin negative 2 BMP with potassium 3.1  Chest x-ray showing aortic atherosclerosis.  Total cholesterol 195    PMH:   has a past medical history of Allergic rhinitis, cause unspecified; Asthma; Diabetes mellitus without complication ([place]); Elbow fracture (age 31 or 83); colonic polyps; Other  and unspecified hyperlipidemia; and Unspecified essential hypertension.  PSH:    Past Surgical History:  Procedure Laterality Date  . 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 CHOLECYSTECTOMY    . KIDNEY STONE SURGERY    . left leg fracture      Current Outpatient Prescriptions  Medication Sig Dispense Refill  . amLODipine (NORVASC) 10 MG tablet Take 1 tablet (10 mg total) by mouth daily. 90 tablet 3  . loratadine (CLARITIN) 10 MG tablet Take 10 mg by mouth daily.     . Multiple Vitamin (MULTIVITAMIN) capsule Take 1 capsule by mouth daily.       No current facility-administered medications for this visit.      Allergies:   Fluticasone propionate   Social History:  The patient  reports that he quit smoking about 44 years ago. His smoking use included Cigarettes. He has a 1.50 pack-year smoking history. He has never used smokeless tobacco. He reports that he does not drink alcohol or use drugs.   Family History:   family history includes Diabetes in his mother; Lung cancer in his father; Pancreatic cancer in his paternal grandfather.    Review of Systems: Review of Systems  Constitutional: Negative.   Respiratory: Negative.   Cardiovascular: Positive for palpitations.  Gastrointestinal: Negative.   Musculoskeletal: Negative.   Neurological: Negative.   Psychiatric/Behavioral: Negative.   All other systems reviewed and are negative.    PHYSICAL EXAM: VS:  BP Marland Kitchen)  160/92 (BP Location: Left Arm, Patient Position: Sitting, Cuff Size: Normal)   Pulse (!) 49   Ht 5\' 9"  (1.753 m)   Wt 205 lb 4 oz (93.1 kg)   BMI 30.31 kg/m  , BMI Body mass index is 30.31 kg/m. GEN: Well nourished, well developed, in no acute distress , obese HEENT: normal  Neck: no JVD, carotid bruits, or masses Cardiac: RRR; no murmurs, rubs, or gallops,no edema  Respiratory:  clear to auscultation bilaterally, normal work of breathing GI: soft, nontender, nondistended, + BS MS: no deformity or  atrophy  Skin: warm and dry, no rash Neuro:  Strength and sensation are intact Psych: euthymic mood, full affect    Recent Labs: 12/17/2016: BUN 17; Creatinine, Ser 1.04; Hemoglobin 15.1; Platelets 218; Potassium 3.1; Sodium 141    Lipid Panel Lab Results  Component Value Date   CHOL 195 04/22/2016   HDL 45.40 04/22/2016   LDLCALC 113 (H) 04/22/2016   TRIG 182.0 (H) 04/22/2016      Wt Readings from Last 3 Encounters:  06/09/17 205 lb 4 oz (93.1 kg)  01/07/17 206 lb 8 oz (93.7 kg)  12/17/16 200 lb (90.7 kg)       ASSESSMENT AND PLAN:  chest pain - CT coronary calcium score ordered, ordered on his last clinic visit He is interested in doing the test Also has lifestyle screening study October unless he cancels  Aortic atherosclerosis ([place]) - Plan: EKG 12-Lead, CT CARDIAC SCORING Seen on chest x-ray  Discussed his cholesterol, suggested he consider CT coronary calcium scoring  Essential hypertension - Plan: EKG 12-Lead, CT CARDIAC SCORING Blood pressure elevated, did not take his blood pressure pill today Discussed that high blood pressure can cause shortness of breath on exertion amongst other issues Suggested he monitor blood pressure at home  Mixed hyperlipidemia - Plan: EKG 12-Lead, CT CARDIAC SCORING Borderline cholesterol suggested he use imaging studies detailed above to help guide his cholesterol management   if he has elevated calcium score, would be more aggressive, would start a statin  Former smoker stop smoking many years ago , when he was a teenager  Borderline diabetes We have encouraged continued exercise, careful diet management in an effort to lose weight.  Palpitations Likely having rare PVCs 1 seen on previous EKG Suggested event monitor if he continues to have symptoms This would be difficult to treat given his bradycardia  Disposition:   F/U  12 months PRN  Long discussion concerning electrical issues and workup including monitors,    Total encounter time more than 25 minutes  Greater than 50% was spent in counseling and coordination of care with the patient    No orders of the defined types were placed in this encounter.    Signed, Esmond Plants, M.D., Ph.D. 06/09/2017  [place], [place]

## 2017-06-09 ENCOUNTER — Ambulatory Visit (INDEPENDENT_AMBULATORY_CARE_PROVIDER_SITE_OTHER): Payer: 59 | Admitting: Cardiovascular Disease

## 2017-06-09 ENCOUNTER — Encounter: Payer: Self-pay | Admitting: Cardiovascular Disease

## 2017-06-09 VITALS — BP 160/92 | HR 49 | Ht 69.0 in | Wt 205.2 lb

## 2017-06-09 DIAGNOSIS — G4733 Obstructive sleep apnea (adult) (pediatric): Secondary | ICD-10-CM | POA: Diagnosis not present

## 2017-06-09 DIAGNOSIS — E782 Mixed hyperlipidemia: Secondary | ICD-10-CM

## 2017-06-09 DIAGNOSIS — I700 Atherosclerosis of aorta: Secondary | ICD-10-CM

## 2017-06-09 DIAGNOSIS — R7303 Prediabetes: Secondary | ICD-10-CM | POA: Diagnosis not present

## 2017-06-09 DIAGNOSIS — I10 Essential (primary) hypertension: Secondary | ICD-10-CM

## 2017-06-09 DIAGNOSIS — Z87891 Personal history of nicotine dependence: Secondary | ICD-10-CM

## 2017-06-09 NOTE — Patient Instructions (Addendum)
Medication Instructions:   No medication changes made  Labwork:  No new labs needed  Testing/Procedures:  Please call if you would even monitor 2 wk for palpitations   Follow-Up: It was a pleasure seeing you in the office today. Please call us if you have new issues that need to be addressed before your next appt.  [phone]  Your physician wants you to follow-up in: 12 months prn  You will receive a reminder letter in the mail two months in advance. If you don't receive a letter, please call our office to schedule the follow-up appointment.  If you need a refill on your cardiac medications before your next appointment, please call your pharmacy.

## 2017-06-20 ENCOUNTER — Other Ambulatory Visit: Payer: Self-pay | Admitting: Family Medicine

## 2017-06-23 ENCOUNTER — Telehealth: Payer: Self-pay | Admitting: Family Medicine

## 2017-06-23 ENCOUNTER — Other Ambulatory Visit: Payer: Self-pay | Admitting: *Deleted

## 2017-06-23 MED ORDER — AMLODIPINE BESYLATE 10 MG PO TABS: 10.0000 mg | ORAL_TABLET | Freq: Every day | ORAL | 0 refills | Status: DC

## 2017-06-23 MED FILL — AMLODIPINE BESYLATE 10 MG T: 10 | 90 days supply | Qty: 90 | Fill #0

## 2017-06-23 NOTE — Telephone Encounter (Signed)
No recent or future appts., please advise  

## 2017-06-23 NOTE — Telephone Encounter (Signed)
Please schedule f/u and refill until then  

## 2017-06-23 NOTE — Telephone Encounter (Signed)
30 day supply sent to [facility] outpatient pharmacy.

## 2017-06-23 NOTE — Telephone Encounter (Signed)
Pt scheduled appt so med refilled

## 2017-06-23 NOTE — Telephone Encounter (Signed)
Pt called about the status of his amlodipyne refill request.  I scheduled him for 10/15 at 2pm.  Can he get a refill until his appointment?

## 2017-06-23 NOTE — Telephone Encounter (Signed)
Yes-please refill him until then  Will cc to both [place] and [place]

## 2017-06-30 ENCOUNTER — Ambulatory Visit: Payer: 59 | Admitting: Family Medicine

## 2017-07-04 ENCOUNTER — Ambulatory Visit (INDEPENDENT_AMBULATORY_CARE_PROVIDER_SITE_OTHER): Payer: 59 | Admitting: Family Medicine

## 2017-07-04 ENCOUNTER — Encounter: Payer: Self-pay | Admitting: Family Medicine

## 2017-07-04 VITALS — BP 140/70 | HR 59 | Temp 98.0°F | Ht 69.0 in | Wt 201.0 lb

## 2017-07-04 DIAGNOSIS — I10 Essential (primary) hypertension: Secondary | ICD-10-CM

## 2017-07-04 DIAGNOSIS — R972 Elevated prostate specific antigen [PSA]: Secondary | ICD-10-CM

## 2017-07-04 DIAGNOSIS — Z23 Encounter for immunization: Secondary | ICD-10-CM

## 2017-07-04 DIAGNOSIS — R739 Hyperglycemia, unspecified: Secondary | ICD-10-CM

## 2017-07-04 DIAGNOSIS — E782 Mixed hyperlipidemia: Secondary | ICD-10-CM | POA: Diagnosis not present

## 2017-07-04 LAB — CBC WITH DIFFERENTIAL/PLATELET
BASOS ABS: 0 10*3/uL (ref 0.0–0.1)
Basophils Relative: 0.7 % (ref 0.0–3.0)
EOS ABS: 0.6 10*3/uL (ref 0.0–0.7)
Eosinophils Relative: 8 % — ABNORMAL HIGH (ref 0.0–5.0)
HCT: 43 % (ref 39.0–52.0)
HEMOGLOBIN: 14.8 g/dL (ref 13.0–17.0)
Lymphocytes Relative: 41.2 % (ref 12.0–46.0)
Lymphs Abs: 2.9 10*3/uL (ref 0.7–4.0)
MCHC: 34.3 g/dL (ref 30.0–36.0)
MCV: 92 fl (ref 78.0–100.0)
MONO ABS: 0.6 10*3/uL (ref 0.1–1.0)
Monocytes Relative: 8.6 % (ref 3.0–12.0)
Neutro Abs: 2.9 10*3/uL (ref 1.4–7.7)
Neutrophils Relative %: 41.5 % — ABNORMAL LOW (ref 43.0–77.0)
Platelets: 212 10*3/uL (ref 150.0–400.0)
RBC: 4.68 Mil/uL (ref 4.22–5.81)
RDW: 14 % (ref 11.5–15.5)
WBC: 7.1 10*3/uL (ref 4.0–10.5)

## 2017-07-04 LAB — COMPREHENSIVE METABOLIC PANEL
ALBUMIN: 4.4 g/dL (ref 3.5–5.2)
ALT: 44 U/L (ref 0–53)
AST: 27 U/L (ref 0–37)
Alkaline Phosphatase: 34 U/L — ABNORMAL LOW (ref 39–117)
BUN: 17 mg/dL (ref 6–23)
CO2: 32 mEq/L (ref 19–32)
CREATININE: 1.07 mg/dL (ref 0.40–1.50)
Calcium: 9.6 mg/dL (ref 8.4–10.5)
Chloride: 101 mEq/L (ref 96–112)
GFR: 74.45 mL/min (ref >60.00)
Glucose, Bld: 153 mg/dL — ABNORMAL HIGH (ref 70–99)
Potassium: 4.4 mEq/L (ref 3.5–5.1)
SODIUM: 140 meq/L (ref 135–145)
Total Bilirubin: 0.7 mg/dL (ref 0.2–1.2)
Total Protein: 7.4 g/dL (ref 6.0–8.3)

## 2017-07-04 LAB — LIPID PANEL
CHOL/HDL RATIO: 5
CHOLESTEROL: 203 mg/dL — AB (ref 0–200)
HDL: 40.2 mg/dL (ref >39.00)
NONHDL: 162.7
Triglycerides: 246 mg/dL — ABNORMAL HIGH (ref 0.0–149.0)
VLDL: 49.2 mg/dL — ABNORMAL HIGH (ref 0.0–40.0)

## 2017-07-04 LAB — HEMOGLOBIN A1C: HEMOGLOBIN A1C: 6.5 % (ref 4.6–6.5)

## 2017-07-04 LAB — LDL CHOLESTEROL, DIRECT: Direct LDL: 129 mg/dL

## 2017-07-04 MED ORDER — AMLODIPINE BESYLATE 10 MG PO TABS: 10.0000 mg | ORAL_TABLET | Freq: Every day | ORAL | 3 refills | Status: DC

## 2017-07-04 MED ORDER — AMLODIPINE BESYLATE 10 MG PO TABS
10.0000 mg | ORAL_TABLET | Freq: Every day | ORAL | 0 refills | Status: DC
Start: 2017-07-04 — End: 2017-09-15

## 2017-07-04 NOTE — Progress Notes (Signed)
Pre visit review using our clinic review tool, if applicable. No additional management support is needed unless otherwise documented below in the visit note. 

## 2017-07-04 NOTE — Patient Instructions (Signed)
Flu shot today  Labs today   Take care of yourself  Stay active Eat healthy

## 2017-07-04 NOTE — Progress Notes (Signed)
Subjective:    Patient ID: Jerry [name], male    DOB: November 25, 1955, 61 y.o.   MRN: [id number]  HPI Here for f/u of chronic medical problems   Doing fine  Working a lot   Wt Readings from Last 3 Encounters:  07/04/17 201 lb (91.2 kg)  06/09/17 205 lb 4 oz (93.1 kg)  01/07/17 206 lb 8 oz (93.7 kg)  working on weight a little /not enough  Watching what he eats/avoid fatty stuff Walking for exercise and also lifting  29.68 kg/m   bp is stable today (was high in cardiol and ED)  No cp or palpitations or headaches or edema  No side effects to medicines  BP Readings from Last 3 Encounters:  07/04/17 140/70  06/09/17 (!) 160/92  01/07/17 (!) 154/82     Takes amlodipine   Lab Results  Component Value Date   CREATININE 1.04 12/17/2016   BUN 17 12/17/2016   NA 141 12/17/2016   K 3.1 (L) 12/17/2016   CL 104 12/17/2016   CO2 28 12/17/2016   Lab Results  Component Value Date   ALT 25 04/22/2016   AST 18 04/22/2016   ALKPHOS 32 (L) 04/22/2016   BILITOT 0.9 04/22/2016    K was low - in April  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  Had stopped eating bananas for a while    Had some episodes in the spring- saw cardiology and also went to ED Per pt w/u was negative  No episodes since    He drinks 2 cups of coffee in the am    Pulse Readings from Last 3 Encounters:  07/04/17 (!) 59  06/09/17 (!) 49  01/07/17 61  pulse is normally around 60      Hx of prediabetes  Lab Results  Component Value Date   HGBA1C 6.1 04/22/2016  diet is fair overall - pays attention to it     Hyperlipidemia Lab Results  Component Value Date   CHOL 195 04/22/2016   HDL 45.40 04/22/2016   LDLCALC 113 (H) 04/22/2016   LDLDIRECT 160.9 09/03/2012   TRIG 182.0 (H) 04/22/2016   CHOLHDL 4 04/22/2016    Prostate care - sees urology  Had a uti in the spring - had to go to UC and then f/u with urology - resolved Last psa was ok with urology  Does not go yearly to urology    Will check psa  Lab Results  Component Value Date   PSA 4.31 (H) 04/22/2016   PSA 2.66 01/18/2014   PSA 2.53 09/03/2012  no voiding symptoms or nocturia   Patient Active Problem List   Diagnosis Date Noted  . Prediabetes 01/07/2017  . Former smoker 01/07/2017  . Aortic atherosclerosis ([place]) 01/06/2017  . Elevated PSA 04/24/2016  . Routine general medical examination at a health care facility 08/05/2011  . Prostate cancer screening 08/05/2011  . TINNITUS 09/06/2009  . Hyperglycemia 08/21/2009  . COLONIC POLYPS, HX OF 01/16/2009  . Obstructive sleep apnea 10/26/2008  . Mixed hyperlipidemia 03/11/2007  . Essential hypertension 03/11/2007  . Allergic rhinitis 03/11/2007  . ASTHMA 03/11/2007   Past Medical History:  Diagnosis Date  . Allergic rhinitis, cause unspecified   . Asthma   . Diabetes mellitus without complication ([place])   . Elbow fracture age 23 or 51   left  . Hx of colonic polyps   . Other and unspecified hyperlipidemia   . Unspecified essential hypertension    Past Surgical  History:  Procedure Laterality Date  . 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 CHOLECYSTECTOMY    . KIDNEY STONE SURGERY    . left leg fracture     Social History  Substance Use Topics  . Smoking status: Former Smoker    Packs/day: 0.50    Years: 3.00    Types: Cigarettes    Quit date: 09/16/1972  . Smokeless tobacco: Never Used  . Alcohol use No   Family History  Problem Relation Age of Onset  . Lung cancer Father   . Diabetes Mother   . Pancreatic cancer Paternal Grandfather   . Colon cancer Neg Hx    Allergies  Allergen Reactions  . Fluticasone Propionate     REACTION: nosebleeds   Current Outpatient Prescriptions on File Prior to Visit  Medication Sig Dispense Refill  . loratadine (CLARITIN) 10 MG tablet Take 10 mg by mouth daily.     . Multiple Vitamin (MULTIVITAMIN) capsule Take 1 capsule by mouth daily.       No current facility-administered medications  on file prior to visit.     Review of Systems  Constitutional: Negative for activity change, appetite change, fatigue, fever and unexpected weight change.  HENT: Negative for congestion, rhinorrhea, sore throat and trouble swallowing.   Eyes: Negative for pain, redness, itching and visual disturbance.  Respiratory: Negative for cough, chest tightness, shortness of breath and wheezing.   Cardiovascular: Negative for chest pain and palpitations.  Gastrointestinal: Negative for abdominal pain, blood in stool, constipation, diarrhea and nausea.  Endocrine: Negative for cold intolerance, heat intolerance, polydipsia and polyuria.  Genitourinary: Negative for difficulty urinating, dysuria, frequency and urgency.  Musculoskeletal: Negative for arthralgias, joint swelling and myalgias.  Skin: Negative for pallor and rash.  Neurological: Negative for dizziness, tremors, weakness, numbness and headaches.  Hematological: Negative for adenopathy. Does not bruise/bleed easily.  Psychiatric/Behavioral: Negative for decreased concentration and dysphoric mood. The patient is not nervous/anxious.        Objective:   Physical Exam  Constitutional: He appears well-developed and well-nourished. No distress.  Well appearing   HENT:  Head: Normocephalic and atraumatic.  Mouth/Throat: Oropharynx is clear and moist.  Eyes: Pupils are equal, round, and reactive to light. Conjunctivae and EOM are normal.  Neck: Normal range of motion. Neck supple. No JVD present. Carotid bruit is not present. No thyromegaly present.  Cardiovascular: Normal rate, regular rhythm, normal heart sounds and intact distal pulses.  Exam reveals no gallop.   Baseline pulse   Pulmonary/Chest: Effort normal and breath sounds normal. No respiratory distress. He has no wheezes. He has no rales.  No crackles  Abdominal: Soft. Bowel sounds are normal. He exhibits no distension, no abdominal bruit and no mass. There is no tenderness.   Musculoskeletal: He exhibits no edema.  Lymphadenopathy:    He has no cervical adenopathy.  Neurological: He is alert. He has normal reflexes. No cranial nerve deficit. He exhibits normal muscle tone. Coordination normal.  Skin: Skin is warm and dry. No rash noted.  Psychiatric: He has a normal mood and affect.          Assessment & Plan:   Problem List Items Addressed This Visit      Cardiovascular and Mediastinum   Essential hypertension - Primary    bp in fair control at this time  BP Readings from Last 1 Encounters:  07/04/17 140/70   No changes needed Disc lifstyle change with low sodium diet and exercise  Labs ordered  Has some white coat component  Amlodipine refilled       Relevant Medications   amLODipine (NORVASC) 10 MG tablet   amLODipine (NORVASC) 10 MG tablet   Other Relevant Orders   CBC with Differential/Platelet   Comprehensive metabolic panel   Lipid panel   TSH     Other   Elevated PSA    Pt has seen urology  Due for PSA Had one uti this spring No symptoms currently       Relevant Orders   PSA   Hyperglycemia    Due for A1C Diet is fair  Stable wt  disc imp of low glycemic diet and wt loss to prevent DM2       Relevant Orders   Hemoglobin A1c   Mixed hyperlipidemia    Due for lipids  Disc goals for lipids and reasons to control them Rev labs with pt  (last check)  Rev low sat fat diet in detail  He is mindful of his diet       Relevant Medications   amLODipine (NORVASC) 10 MG tablet   amLODipine (NORVASC) 10 MG tablet   Other Relevant Orders   Lipid panel    Other Visit Diagnoses    Need for immunization against influenza       Relevant Orders   Flu Vaccine QUAD 36+ mos IM (Completed)

## 2017-07-04 NOTE — Assessment & Plan Note (Signed)
bp in fair control at this time  BP Readings from Last 1 Encounters:  07/04/17 140/70   No changes needed Disc lifstyle change with low sodium diet and exercise  Labs ordered  Has some white coat component  Amlodipine refilled

## 2017-07-04 NOTE — Assessment & Plan Note (Signed)
Due for lipids  Disc goals for lipids and reasons to control them Rev labs with pt  (last check)  Rev low sat fat diet in detail  He is mindful of his diet

## 2017-07-04 NOTE — Assessment & Plan Note (Signed)
Pt has seen urology  Due for PSA Had one uti this spring No symptoms currently

## 2017-07-04 NOTE — Assessment & Plan Note (Signed)
Due for A1C Diet is fair  Stable wt  disc imp of low glycemic diet and wt loss to prevent DM2

## 2017-07-07 LAB — TSH: TSH: 3.43 u[IU]/mL (ref 0.35–4.50)

## 2017-07-07 LAB — PSA: PSA: 3.36 ng/mL (ref 0.10–4.00)

## 2017-07-09 ENCOUNTER — Encounter: Payer: Self-pay | Admitting: *Deleted

## 2017-09-15 ENCOUNTER — Other Ambulatory Visit: Payer: Self-pay | Admitting: Family Medicine

## 2017-09-15 MED FILL — AMLODIPINE BESYLATE 10 MG T: 10 | 90 days supply | Qty: 90 | Fill #0

## 2017-12-05 ENCOUNTER — Encounter: Payer: Self-pay | Admitting: Family Medicine

## 2017-12-05 ENCOUNTER — Ambulatory Visit: Payer: 59 | Admitting: Family Medicine

## 2017-12-05 VITALS — BP 138/84 | HR 54 | Temp 97.9°F | Wt 198.0 lb

## 2017-12-05 DIAGNOSIS — R109 Unspecified abdominal pain: Secondary | ICD-10-CM | POA: Insufficient documentation

## 2017-12-05 DIAGNOSIS — R300 Dysuria: Secondary | ICD-10-CM

## 2017-12-05 LAB — POC URINALSYSI DIPSTICK (AUTOMATED)
BILIRUBIN UA: NEGATIVE
Blood, UA: NEGATIVE
GLUCOSE UA: NEGATIVE
KETONES UA: NEGATIVE
Nitrite, UA: NEGATIVE
PROTEIN UA: NEGATIVE
Spec Grav, UA: 1.015 (ref 1.010–1.025)
Urobilinogen, UA: 0.2 E.U./dL
pH, UA: 6.5 (ref 5.0–8.0)

## 2017-12-05 MED ORDER — CIPROFLOXACIN HCL 500 MG PO TABS: 500.0000 mg | ORAL_TABLET | Freq: Two times a day (BID) | ORAL | 0 refills | Status: DC

## 2017-12-05 MED ORDER — TAMSULOSIN HCL 0.4 MG PO CAPS: 0.4000 mg | ORAL_CAPSULE | Freq: Every day | ORAL | 0 refills | Status: DC

## 2017-12-05 MED FILL — CIPROFLOXACIN HCL 500 MG TA: 500 | 7 days supply | Qty: 14 | Fill #0

## 2017-12-05 MED FILL — TAMSULOSIN HCL 0.4 MG CAP: 0.4 | 20 days supply | Qty: 20 | Fill #0

## 2017-12-05 NOTE — Assessment & Plan Note (Addendum)
R flank pain with dysuria - ?UTI/pyelonephritis vs kidney stone. UA and micro today overall unrevealing - UCx sent. Overall well appearing today and endorses symptoms improving. Offered KUB today - pt declines. Rx cipro 500mg  bid x 7 days with WASP for flomax if needed. Update if not improving with treatment. Red flags to seek further care reviewed. Pt agrees with plan.

## 2017-12-05 NOTE — Patient Instructions (Signed)
Urine overall ok. Urine culture sent. I am suspicious for kidney stone and possible UTI with burning symptoms. Treat with cipro 500mg  twice daily for a week.  Push fluids and rest. May try cranberry juice.  If needed, may take flomax to help pass a stone.  Let us know if worsening pain, if fever >101, or trouble emptying bladder.

## 2017-12-05 NOTE — Progress Notes (Signed)
BP 138/84 (BP Location: Left Arm, Patient Position: Sitting, Cuff Size: Normal)   Pulse (!) 54   Temp 97.9 F (36.6 C) (Oral)   Wt 198 lb (89.8 kg)   SpO2 97%   BMI 29.24 kg/m    CC: UTI? Subjective:    Patient ID: Jerry [name], male    DOB: 1955/12/15, 62 y.o.   MRN: [id number]  HPI: DHILLON COMUNALE is a 62 y.o. male presenting on 12/05/2017 for Flank Pain (Right flank pain radiating around to right testicle and right hip. Started 3 days ago. ) and Dysuria   3-4d h/o dysuria associated with R flank pain described as 3/10 constant sore ache with radiation down to groin and R testicle. Mild urgency, frequency.   No fevers/chills, no noted hematuria, nausea/vomiting. No rectal pain.   H/o kidney stones in the past needing kidney stone surgery (2000).  Treating pain with tylenol.     Relevant past medical, surgical, family and social history reviewed and updated as indicated. Interim medical history since our last visit reviewed. Allergies and medications reviewed and updated. Outpatient Medications Prior to Visit  Medication Sig Dispense Refill  . amLODipine (NORVASC) 10 MG tablet TAKE 1 TABLET BY MOUTH ONCE DAILY 90 tablet 2  . loratadine (CLARITIN) 10 MG tablet Take 10 mg by mouth daily.     . Multiple Vitamin (MULTIVITAMIN) capsule Take 1 capsule by mouth daily.       No facility-administered medications prior to visit.      Per HPI unless specifically indicated in ROS section below Review of Systems     Objective:    BP 138/84 (BP Location: Left Arm, Patient Position: Sitting, Cuff Size: Normal)   Pulse (!) 54   Temp 97.9 F (36.6 C) (Oral)   Wt 198 lb (89.8 kg)   SpO2 97%   BMI 29.24 kg/m   Wt Readings from Last 3 Encounters:  12/05/17 198 lb (89.8 kg)  07/04/17 201 lb (91.2 kg)  06/09/17 205 lb 4 oz (93.1 kg)    Physical Exam  Constitutional: He appears well-developed and well-nourished. No distress.  Abdominal: Soft. Normal appearance and bowel  sounds are normal. He exhibits no distension and no mass. There is no hepatosplenomegaly. There is tenderness (mild) in the right lower quadrant and suprapubic area. There is no rigidity, no rebound, no guarding, no CVA tenderness and negative Murphy's sign.  Nursing note and vitals reviewed.  Results for orders placed or performed in visit on 12/05/17  POCT Urinalysis Dipstick (Automated)  Result Value Ref Range   Color, UA yellow    Clarity, UA clear    Glucose, UA negative    Bilirubin, UA negative    Ketones, UA negative    Spec Grav, UA 1.015 1.010 - 1.025   Blood, UA negative    pH, UA 6.5 5.0 - 8.0   Protein, UA negative    Urobilinogen, UA 0.2 0.2 or 1.0 E.U./dL   Nitrite, UA negative    Leukocytes, UA Trace (A) Negative    Lab Results  Component Value Date   PSA 3.36 07/04/2017   PSA 4.31 (H) 04/22/2016   PSA 2.66 01/18/2014    Lab Results  Component Value Date   CREATININE 1.07 07/04/2017       Assessment & Plan:   Problem List Items Addressed This Visit    Right flank pain - Primary    R flank pain with dysuria - ?UTI/pyelonephritis vs kidney stone. UA and  micro today overall unrevealing - UCx sent. Overall well appearing today and endorses symptoms improving. Offered KUB today - pt declines. Rx cipro 500mg  bid x 7 days with WASP for flomax if needed. Update if not improving with treatment. Red flags to seek further care reviewed. Pt agrees with plan.       Relevant Orders   Urine Culture    Other Visit Diagnoses    Dysuria       Relevant Orders   POCT Urinalysis Dipstick (Automated) (Completed)   Urine Culture       Meds ordered this encounter  Medications  . ciprofloxacin (CIPRO) 500 MG tablet    Sig: Take 1 tablet (500 mg total) by mouth 2 (two) times daily.    Dispense:  14 tablet    Refill:  0  . tamsulosin (FLOMAX) 0.4 MG CAPS capsule    Sig: Take 1 capsule (0.4 mg total) by mouth daily.    Dispense:  20 capsule    Refill:  0   Orders Placed  This Encounter  Procedures  . Urine Culture  . POCT Urinalysis Dipstick (Automated)    Follow up plan: Return if symptoms worsen or fail to improve.  Ria Bush, MD

## 2017-12-08 ENCOUNTER — Telehealth: Payer: Self-pay

## 2017-12-08 LAB — URINE CULTURE
MICRO NUMBER: 90363245
SPECIMEN QUALITY:: ADEQUATE

## 2017-12-08 MED ORDER — AMOXICILLIN 875 MG PO TABS: 875.0000 mg | ORAL_TABLET | Freq: Two times a day (BID) | ORAL | 0 refills | Status: DC

## 2017-12-08 NOTE — Telephone Encounter (Signed)
Spoke with patient. No significant improvement - febrile to 101 on weekend as well as persistent dysuria.  UCx grew enterococcus faecalis - likely not sensitive to cipro abx chosen. Advised stop cipro, start amoxicillin 875mg  course sent to local pharmacy of his request.  I did ask him to update [place] if no improvement after 1-2 days of new abx course.

## 2017-12-08 NOTE — Telephone Encounter (Signed)
Copied from [place] [id number]. Topic: Inquiry >> Dec 08, 2017  3:25 PM Arletha Grippe wrote: Reason for CRM: please call pt about urine lab [phone]

## 2017-12-08 NOTE — Telephone Encounter (Signed)
Pt requesting urine culture results from 12/05/17.

## 2017-12-09 ENCOUNTER — Telehealth: Payer: Self-pay | Admitting: Family Medicine

## 2017-12-09 DIAGNOSIS — R109 Unspecified abdominal pain: Secondary | ICD-10-CM

## 2017-12-09 DIAGNOSIS — R8279 Other abnormal findings on microbiological examination of urine: Secondary | ICD-10-CM | POA: Insufficient documentation

## 2017-12-09 NOTE — Telephone Encounter (Signed)
Please see message string from yesterday.

## 2017-12-09 NOTE — Telephone Encounter (Signed)
Spoke with pt relaying message per Dr. Darnell Level.  Pt states he is still having fevers of about 101 all day despite abx and Tylenol.  Coming in for lab visit tomorrow at 2:30 PM.

## 2017-12-09 NOTE — Telephone Encounter (Signed)
Referral done  Will cc to Dr Darnell Level who saw him  Will route to [facility]

## 2017-12-09 NOTE — Telephone Encounter (Signed)
I'm sorry he's no better. uro referral has been placed. Will cc Anastasiya. plz call - is he having persistent fevers despite abx? Recommend he come in tomorrow for labs (ordered) to check kidneys and white cell count, and let me know when he comes in to talk to him to get an update if we can.

## 2017-12-09 NOTE — Telephone Encounter (Signed)
Thanks. Terri I'd like to touch base with patient when he comes in for labwork.

## 2017-12-09 NOTE — Telephone Encounter (Signed)
Copied from [place]. Topic: Referral - Request >> Dec 09, 2017  4:02 PM Robina Ade, Helene Kelp D wrote: Reason for CRM: Patient called and said that he is not feeling better and would like a referral to go to [facility]. Please call patient back, thanks.

## 2017-12-10 ENCOUNTER — Other Ambulatory Visit (INDEPENDENT_AMBULATORY_CARE_PROVIDER_SITE_OTHER): Payer: 59

## 2017-12-10 DIAGNOSIS — R109 Unspecified abdominal pain: Secondary | ICD-10-CM

## 2017-12-10 LAB — COMPREHENSIVE METABOLIC PANEL
ALBUMIN: 3.7 g/dL (ref 3.5–5.2)
ALK PHOS: 43 U/L (ref 39–117)
ALT: 26 U/L (ref 0–53)
AST: 19 U/L (ref 0–37)
BILIRUBIN TOTAL: 0.6 mg/dL (ref 0.2–1.2)
BUN: 14 mg/dL (ref 6–23)
CALCIUM: 9.3 mg/dL (ref 8.4–10.5)
CO2: 32 mEq/L (ref 19–32)
CREATININE: 1 mg/dL (ref 0.40–1.50)
Chloride: 102 mEq/L (ref 96–112)
GFR: 80.38 mL/min (ref >60.00)
Glucose, Bld: 124 mg/dL — ABNORMAL HIGH (ref 70–99)
Potassium: 3.7 mEq/L (ref 3.5–5.1)
SODIUM: 140 meq/L (ref 135–145)
Total Protein: 7.7 g/dL (ref 6.0–8.3)

## 2017-12-10 LAB — CBC WITH DIFFERENTIAL/PLATELET
BASOS PCT: 0.5 % (ref 0.0–3.0)
Basophils Absolute: 0.1 10*3/uL (ref 0.0–0.1)
EOS ABS: 0.4 10*3/uL (ref 0.0–0.7)
Eosinophils Relative: 3.1 % (ref 0.0–5.0)
HEMATOCRIT: 40.5 % (ref 39.0–52.0)
HEMOGLOBIN: 13.9 g/dL (ref 13.0–17.0)
LYMPHS PCT: 25.1 % (ref 12.0–46.0)
Lymphs Abs: 3 10*3/uL (ref 0.7–4.0)
MCHC: 34.4 g/dL (ref 30.0–36.0)
MCV: 90.9 fl (ref 78.0–100.0)
MONO ABS: 1.2 10*3/uL — AB (ref 0.1–1.0)
Monocytes Relative: 10.1 % (ref 3.0–12.0)
Neutro Abs: 7.3 10*3/uL (ref 1.4–7.7)
Neutrophils Relative %: 61.2 % (ref 43.0–77.0)
Platelets: 261 10*3/uL (ref 150.0–400.0)
RBC: 4.46 Mil/uL (ref 4.22–5.81)
RDW: 14.1 % (ref 11.5–15.5)
WBC: 11.9 10*3/uL — AB (ref 4.0–10.5)

## 2017-12-10 NOTE — Telephone Encounter (Signed)
Working on referral [place], RMA

## 2017-12-15 DIAGNOSIS — R300 Dysuria: Secondary | ICD-10-CM | POA: Diagnosis not present

## 2017-12-15 MED FILL — AMOX TR-K CLV 875-125 MG TA: 875-125 | 7 days supply | Qty: 14 | Fill #0

## 2017-12-16 MED FILL — AMOXICILLIN 875 MG TABLET: 875 | 7 days supply | Qty: 14 | Fill #0

## 2017-12-22 DIAGNOSIS — R300 Dysuria: Secondary | ICD-10-CM | POA: Diagnosis not present

## 2017-12-22 DIAGNOSIS — R1084 Generalized abdominal pain: Secondary | ICD-10-CM | POA: Diagnosis not present

## 2017-12-22 DIAGNOSIS — Z87442 Personal history of urinary calculi: Secondary | ICD-10-CM | POA: Diagnosis not present

## 2017-12-22 DIAGNOSIS — R1031 Right lower quadrant pain: Secondary | ICD-10-CM | POA: Diagnosis not present

## 2017-12-29 MED FILL — AMLODIPINE BESYLATE 10 MG T: 10 | 90 days supply | Qty: 90 | Fill #1

## 2018-01-29 DIAGNOSIS — W1809XA Striking against other object with subsequent fall, initial encounter: Secondary | ICD-10-CM | POA: Diagnosis not present

## 2018-01-29 DIAGNOSIS — I10 Essential (primary) hypertension: Secondary | ICD-10-CM | POA: Diagnosis not present

## 2018-01-29 DIAGNOSIS — R200 Anesthesia of skin: Secondary | ICD-10-CM | POA: Diagnosis not present

## 2018-01-29 DIAGNOSIS — M545 Low back pain: Secondary | ICD-10-CM | POA: Diagnosis not present

## 2018-01-29 DIAGNOSIS — S300XXA Contusion of lower back and pelvis, initial encounter: Secondary | ICD-10-CM | POA: Diagnosis not present

## 2018-01-29 DIAGNOSIS — M62830 Muscle spasm of back: Secondary | ICD-10-CM | POA: Diagnosis not present

## 2018-01-29 DIAGNOSIS — W109XXA Fall (on) (from) unspecified stairs and steps, initial encounter: Secondary | ICD-10-CM | POA: Diagnosis not present

## 2018-01-29 DIAGNOSIS — E7800 Pure hypercholesterolemia, unspecified: Secondary | ICD-10-CM | POA: Diagnosis not present

## 2018-01-29 DIAGNOSIS — Z881 Allergy status to other antibiotic agents status: Secondary | ICD-10-CM | POA: Diagnosis not present

## 2018-01-29 DIAGNOSIS — R252 Cramp and spasm: Secondary | ICD-10-CM | POA: Diagnosis not present

## 2018-01-29 DIAGNOSIS — W19XXXA Unspecified fall, initial encounter: Secondary | ICD-10-CM | POA: Diagnosis not present

## 2018-01-31 ENCOUNTER — Emergency Department
Admission: EM | Admit: 2018-01-31 | Discharge: 2018-01-31 | Disposition: A | Discharge status: Home / Self Care | Payer: 59 | Attending: Emergency Medicine | Admitting: Emergency Medicine

## 2018-01-31 DIAGNOSIS — Z79899 Other long term (current) drug therapy: Secondary | ICD-10-CM | POA: Insufficient documentation

## 2018-01-31 DIAGNOSIS — J45909 Unspecified asthma, uncomplicated: Secondary | ICD-10-CM | POA: Diagnosis not present

## 2018-01-31 DIAGNOSIS — R1032 Left lower quadrant pain: Secondary | ICD-10-CM | POA: Diagnosis not present

## 2018-01-31 DIAGNOSIS — R109 Unspecified abdominal pain: Secondary | ICD-10-CM | POA: Diagnosis not present

## 2018-01-31 DIAGNOSIS — M549 Dorsalgia, unspecified: Secondary | ICD-10-CM | POA: Diagnosis not present

## 2018-01-31 DIAGNOSIS — Z87891 Personal history of nicotine dependence: Secondary | ICD-10-CM | POA: Insufficient documentation

## 2018-01-31 DIAGNOSIS — E119 Type 2 diabetes mellitus without complications: Secondary | ICD-10-CM | POA: Insufficient documentation

## 2018-01-31 DIAGNOSIS — W19XXXA Unspecified fall, initial encounter: Secondary | ICD-10-CM

## 2018-01-31 DIAGNOSIS — I10 Essential (primary) hypertension: Secondary | ICD-10-CM | POA: Insufficient documentation

## 2018-01-31 LAB — TROPONIN I

## 2018-01-31 LAB — URINALYSIS, ROUTINE W REFLEX MICROSCOPIC
BILIRUBIN URINE: NEGATIVE
Glucose, UA: NEGATIVE mg/dL
HGB URINE DIPSTICK: NEGATIVE
Ketones, ur: NEGATIVE mg/dL
Leukocytes, UA: NEGATIVE
Nitrite: NEGATIVE
PROTEIN: NEGATIVE mg/dL
SPECIFIC GRAVITY, URINE: 1.014 (ref 1.005–1.030)
pH: 6 (ref 5.0–8.0)

## 2018-01-31 LAB — COMPREHENSIVE METABOLIC PANEL
ALK PHOS: 29 U/L — AB (ref 38–126)
ALT: 23 U/L (ref 17–63)
ANION GAP: 12 (ref 5–15)
AST: 33 U/L (ref 15–41)
Albumin: 4.1 g/dL (ref 3.5–5.0)
BILIRUBIN TOTAL: 0.8 mg/dL (ref 0.3–1.2)
BUN: 17 mg/dL (ref 6–20)
CALCIUM: 9.4 mg/dL (ref 8.9–10.3)
CO2: 24 mmol/L (ref 22–32)
Chloride: 103 mmol/L (ref 101–111)
Creatinine, Ser: 1.04 mg/dL (ref 0.61–1.24)
Glucose, Bld: 196 mg/dL — ABNORMAL HIGH (ref 65–99)
Potassium: 3.6 mmol/L (ref 3.5–5.1)
Sodium: 139 mmol/L (ref 135–145)
TOTAL PROTEIN: 7.2 g/dL (ref 6.5–8.1)

## 2018-01-31 LAB — CBC WITH DIFFERENTIAL/PLATELET
BASOS ABS: 0 10*3/uL (ref 0–0.1)
BASOS PCT: 0 %
EOS PCT: 2 %
Eosinophils Absolute: 0.2 10*3/uL (ref 0–0.7)
HCT: 42.2 % (ref 40.0–52.0)
Hemoglobin: 14.4 g/dL (ref 13.0–18.0)
Lymphocytes Relative: 12 %
Lymphs Abs: 1.2 10*3/uL (ref 1.0–3.6)
MCH: 31.2 pg (ref 26.0–34.0)
MCHC: 34.2 g/dL (ref 32.0–36.0)
MCV: 91.3 fL (ref 80.0–100.0)
MONO ABS: 0.8 10*3/uL (ref 0.2–1.0)
Monocytes Relative: 8 %
Neutro Abs: 8.2 10*3/uL — ABNORMAL HIGH (ref 1.4–6.5)
Neutrophils Relative %: 78 %
PLATELETS: 213 10*3/uL (ref 150–440)
RBC: 4.63 MIL/uL (ref 4.40–5.90)
RDW: 14.6 % — AB (ref 11.5–14.5)
WBC: 10.5 10*3/uL (ref 3.8–10.6)

## 2018-01-31 LAB — LIPASE, BLOOD: Lipase: 19 U/L (ref 11–51)

## 2018-01-31 MED ORDER — GABAPENTIN 300 MG PO CAPS: 300.0000 mg | ORAL_CAPSULE | Freq: Three times a day (TID) | ORAL | 0 refills | Status: DC

## 2018-01-31 MED ORDER — PREDNISONE 10 MG (21) PO TBPK: ORAL_TABLET | ORAL | 0 refills | Status: DC

## 2018-01-31 MED ORDER — GABAPENTIN 600 MG PO TABS
300.0000 mg | ORAL_TABLET | Freq: Once | ORAL | Status: AC
  Administered 2018-01-31: 300 mg via ORAL
  Filled 2018-01-31: qty 1

## 2018-01-31 NOTE — Discharge Instructions (Addendum)
Please seek medical attention for any high fevers, chest pain, shortness of breath, change in behavior, persistent vomiting, bloody stool or any other new or concerning symptoms.  

## 2018-01-31 NOTE — ED Provider Notes (Signed)
[facility] Emergency Department Provider Note ____________________________________________   I have reviewed the triage vital signs and the nursing notes.   HISTORY  Chief Complaint Fall and Back Pain   History limited by: Not Limited   HPI Jerry [name] is a 62 y.o. male who presents to the emergency department today because of concern for left flank pain. The patient was seen at outside hospital 2 days ago for a fall off of his truck. Per paperwork he had CXR, CT abd/pel with IV contrast and CT lumbar spine performed. Presumably none showed any concerning findings. He was proscribed narcotic and muscle relaxer. States last night started having bad left sided pain. He describes it as spasms and is worse when he tries to move. Denies any nausea or vomiting, no change in defecation. No shortness of breath. States he has had kidney stones in the past and this does not remind him of that pain.   Per medical record review patient has a history of kidney stones.  Past Medical History:  Diagnosis Date  . Allergic rhinitis, cause unspecified   . Asthma   . Diabetes mellitus without complication ([place])   . Elbow fracture age 64 or 75   left  . History of kidney stones   . Hx of colonic polyps   . Other and unspecified hyperlipidemia   . Unspecified essential hypertension     Patient Active Problem List   Diagnosis Date Noted  . Urine culture positive 12/09/2017  . Right flank pain 12/05/2017  . Prediabetes 01/07/2017  . Former smoker 01/07/2017  . Aortic atherosclerosis ([place]) 01/06/2017  . Elevated PSA 04/24/2016  . Routine general medical examination at a health care facility 08/05/2011  . Prostate cancer screening 08/05/2011  . TINNITUS 09/06/2009  . Hyperglycemia 08/21/2009  . COLONIC POLYPS, HX OF 01/16/2009  . Obstructive sleep apnea 10/26/2008  . Mixed hyperlipidemia 03/11/2007  . Essential hypertension 03/11/2007  . Allergic rhinitis  03/11/2007  . ASTHMA 03/11/2007    Past Surgical History:  Procedure Laterality Date  . 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 CHOLECYSTECTOMY    . KIDNEY STONE SURGERY    . left leg fracture      Prior to Admission medications   Medication Sig Start Date End Date Taking? Authorizing Provider  amLODipine (NORVASC) 10 MG tablet TAKE 1 TABLET BY MOUTH ONCE DAILY 09/15/17   Tower, Wynelle Fanny, MD  amoxicillin (AMOXIL) 875 MG tablet Take 1 tablet (875 mg total) by mouth 2 (two) times daily. 12/08/17   Ria Bush, MD  loratadine (CLARITIN) 10 MG tablet Take 10 mg by mouth daily.     [provider]  Multiple Vitamin (MULTIVITAMIN) capsule Take 1 capsule by mouth daily.      [provider]  tamsulosin (FLOMAX) 0.4 MG CAPS capsule Take 1 capsule (0.4 mg total) by mouth daily. 12/05/17   Ria Bush, MD    Allergies Fluticasone propionate  Family History  Problem Relation Age of Onset  . Lung cancer Father   . Diabetes Mother   . Pancreatic cancer Paternal Grandfather   . Colon cancer Neg Hx     Social History Social History   Tobacco Use  . Smoking status: Former Smoker    Packs/day: 0.50    Years: 3.00    Pack years: 1.50    Types: Cigarettes    Last attempt to quit: 09/16/1972    Years since quitting: 45.4  . Smokeless tobacco:  Never Used  Substance Use Topics  . Alcohol use: No    Alcohol/week: 0.0 oz  . Drug use: No    Review of Systems Constitutional: No fever/chills Eyes: No visual changes. ENT: No sore throat. Cardiovascular: Denies chest pain. Respiratory: Denies shortness of breath. Gastrointestinal: Positive for left flank pain. Genitourinary: Negative for dysuria. Musculoskeletal: Negative for back pain. Skin: Negative for rash. Neurological: Negative for headaches, focal weakness or numbness.  ____________________________________________   PHYSICAL EXAM:  VITAL SIGNS: ED Triage Vitals  Enc Vitals Group     BP  01/31/18 0615 (!) 156/95     Pulse Rate 01/31/18 0615 61     Resp 01/31/18 0615 18     Temp 01/31/18 0615 98 F (36.7 C)     Temp src --      SpO2 01/31/18 0615 94 %     Weight 01/31/18 0617 187 lb (84.8 kg)     Height 01/31/18 0617 _0  (1.753 m)     Head Circumference --      Peak Flow --      Pain Score 01/31/18 0616 3   Constitutional: Alert and oriented. Well appearing and in no distress. Eyes: Conjunctivae are normal.  ENT   Head: Normocephalic and atraumatic.   Nose: No congestion/rhinnorhea.   Mouth/Throat: Mucous membranes are moist.   Neck: No stridor. Hematological/Lymphatic/Immunilogical: No cervical lymphadenopathy. Cardiovascular: Normal rate, systolic murmur.   No murmurs, rubs, or gallops.  Respiratory: Normal respiratory effort without tachypnea nor retractions. Breath sounds are clear and equal bilaterally. No wheezes/rales/rhonchi. Gastrointestinal: Soft and tender to palpation in the left flank. No rebound. No guarding.  Genitourinary: Deferred Musculoskeletal: Normal range of motion in all extremities. No lower extremity edema. Neurologic:  Normal speech and language. No gross focal neurologic deficits are appreciated.  Skin:  Skin is warm, dry and intact. No rash noted. Psychiatric: Mood and affect are normal. Speech and behavior are normal. Patient exhibits appropriate insight and judgment.  ____________________________________________    LABS (pertinent positives/negatives)  CBC wbc 10.5, hgb 14.4, plt 213 CMP wnl except glu 196, alk phos 29 Lipase 19 Trop <0.03 ____________________________________________   EKG  None  ____________________________________________    RADIOLOGY  None  ____________________________________________   PROCEDURES  Procedures  ____________________________________________   INITIAL IMPRESSION / ASSESSMENT AND PLAN / ED COURSE  Pertinent labs & imaging results that were available during my  care of the patient were reviewed by me and considered in my medical decision making (see chart for details).  Patient presented to the emergency department today because of concern for left sided flank pain in the setting of a recent fall. Patient was initially seen at OSH and had imaging performed. Patient's vital signs without concerning findings. Patient denies any additional concerning symptoms. Given negative imaging, lack of new symptoms or abnormal vital signs do not feel any repeat imaging necessary at this time. Will expand patient's pain regimen with gabapentin and steroids. Discussed return precautions.   ____________________________________________   FINAL CLINICAL IMPRESSION(S) / ED DIAGNOSES  Final diagnoses:  Fall, initial encounter  Flank pain     Note: This dictation was prepared with Dragon dictation. Any transcriptional errors that result from this process are unintentional     Nance Pear, MD 01/31/18 [id number]

## 2018-01-31 NOTE — ED Triage Notes (Signed)
Patient coming [facility] from home. Patient reports mechanical fall on Thursday. Patient was seen at [facility] post fall. Patient prescribed vicodin and flexeril. Patient took one of each at 0430 today with no relief.   Patient reports Friday morning he felt like something tore in his left, lateral, lower back. Patient reports increasing pain since. Patient reports the severity of the pain is making it difficult for him to move/ambulate.

## 2018-01-31 NOTE — ED Notes (Signed)
1 unsuccessful attempt at PIV insertion, right hand

## 2018-01-31 NOTE — ED Notes (Signed)
Patient provide urinal and informed urine specimen is needed

## 2018-02-02 ENCOUNTER — Telehealth: Payer: Self-pay | Admitting: *Deleted

## 2018-02-02 DIAGNOSIS — M546 Pain in thoracic spine: Secondary | ICD-10-CM

## 2018-02-02 NOTE — Telephone Encounter (Signed)
[place], they requested that we fax over the notes .Notes were faxed. They will call our office or the patient directly back to schedule. Spoke to patient , I will call [facility] and will try to obtain the records from his visit,. Patient aware that we are working on this referral.

## 2018-02-02 NOTE — Telephone Encounter (Signed)
Pt calling back for status of referral.  Pt states he is in a lot of pain, especially when he coughs or hiccups.

## 2018-02-02 NOTE — Telephone Encounter (Signed)
Copied from [place] [id number]. Topic: Referral - Request >> Feb 02, 2018  8:10 AM Scherrie Gerlach wrote: Reason for CRM: pt states he fell off his truck last week while he was working and went ED in [place], [place].  Also went to the ED at [facility] on Sat. [place] referred [place] to an orthopedic, but which is in [place].  Pt is requesting dr Glori Bickers refer him to an ortho in [place]. Pt states he hurt his left side, and it pops and crackles, feels like a rib moves. Pt requesting asap.

## 2018-02-02 NOTE — Telephone Encounter (Signed)
I put the referral in as urgent  May apologies- was in pt care all am and am just now getting to messages/ref   Will route to [facility]

## 2018-02-04 DIAGNOSIS — M62830 Muscle spasm of back: Secondary | ICD-10-CM | POA: Diagnosis not present

## 2018-02-04 DIAGNOSIS — M940 Chondrocostal junction syndrome [Tietze]: Secondary | ICD-10-CM | POA: Diagnosis not present

## 2018-02-06 ENCOUNTER — Encounter: Payer: Self-pay | Admitting: Family Medicine

## 2018-02-06 ENCOUNTER — Ambulatory Visit: Payer: 59 | Admitting: Family Medicine

## 2018-02-06 VITALS — BP 148/80 | HR 63 | Temp 98.1°F | Ht 69.0 in | Wt 189.5 lb

## 2018-02-06 DIAGNOSIS — Z8042 Family history of malignant neoplasm of prostate: Secondary | ICD-10-CM

## 2018-02-06 DIAGNOSIS — R300 Dysuria: Secondary | ICD-10-CM | POA: Diagnosis not present

## 2018-02-06 DIAGNOSIS — R0789 Other chest pain: Secondary | ICD-10-CM | POA: Insufficient documentation

## 2018-02-06 DIAGNOSIS — I10 Essential (primary) hypertension: Secondary | ICD-10-CM

## 2018-02-06 DIAGNOSIS — N411 Chronic prostatitis: Secondary | ICD-10-CM | POA: Diagnosis not present

## 2018-02-06 LAB — POC URINALSYSI DIPSTICK (AUTOMATED)
BILIRUBIN UA: NEGATIVE
Blood, UA: NEGATIVE
Glucose, UA: NEGATIVE
KETONES UA: NEGATIVE
LEUKOCYTES UA: NEGATIVE
Nitrite, UA: NEGATIVE
Protein, UA: NEGATIVE
Spec Grav, UA: 1.02 (ref 1.010–1.025)
Urobilinogen, UA: 0.2 E.U./dL
pH, UA: 6 (ref 5.0–8.0)

## 2018-02-06 MED ORDER — NITROFURANTOIN MONOHYD MACRO 100 MG PO CAPS
100.0000 mg | ORAL_CAPSULE | Freq: Two times a day (BID) | ORAL | 0 refills | Status: DC
Start: 2018-02-06 — End: 2018-04-20

## 2018-02-06 NOTE — Patient Instructions (Addendum)
We will culture urine  Please take macrobid as directed  Drink lots of fluids  See orthopedics as planned   Follow up with urology - Dr Louis Meckel when you can for family history of prostate cancer and prostatitis   Take care of yourself  Try heat on your chest for soreness

## 2018-02-06 NOTE — Progress Notes (Signed)
Subjective:    Patient ID: Jerry [name], male    DOB: 1956/05/13, 62 y.o.   MRN: [id number]  HPI Here for ED f/u from 5/18 He presented with L flank pain (2 d after a fall from his truck seen at an outside hospital in [facility])  He mis stepped and fell backwards -onto ground and a stick  Felt like it (pushed a rib out of place)  Tried to drive home- too much pain and went to ED Had CT abd/pel , CXR and CT of LS done w/o concerning findings -given narcotic and muscle relaxer  Then developed spasms in L flank area-worse with movement (bad spasm when he tried to get up) - this was worse than the prev pain  No concerning findings on exam  ED px gabapentin and steroids   That has improved   He called here req ref to Ortho in [place]  Is improved but still cannot push or pull/ careful bending or twisting  Getting a deep breath/cough is the worst  Used a band for a while- just for a few days   No productive cough or fever   Wt Readings from Last 3 Encounters:  02/06/18 189 lb 8 oz (86 kg)  01/31/18 187 lb (84.8 kg)  12/05/17 198 lb (89.8 kg)   27.98 kg/m     Wt Readings from Last 3 Encounters:  02/06/18 189 lb 8 oz (86 kg)  01/31/18 187 lb (84.8 kg)  12/05/17 198 lb (89.8 kg)   27.98 kg/m   bp was elevated in [place] Improved at our ED BP Readings from Last 3 Encounters:  02/06/18 (!) 160/74  01/31/18 130/77  12/05/17 138/84  better 2nd check 148/80 Was on prednisone   Having some dysuria now  UA is clear Results for orders placed or performed in visit on 02/06/18  POCT Urinalysis Dipstick (Automated)  Result Value Ref Range   Color, UA Yellow    Clarity, UA Clear    Glucose, UA Negative Negative   Bilirubin, UA Negative    Ketones, UA Negative    Spec Grav, UA 1.020 1.010 - 1.025   Blood, UA Negative    pH, UA 6.0 5.0 - 8.0   Protein, UA Negative Negative   Urobilinogen, UA 0.2 0.2 or 1.0 E.U./dL   Nitrite, UA Negative    Leukocytes, UA  Negative Negative    It burns to urinate  No blood in urine  Has been diag with recurrent prostatitis (Dr Jerry [name])   Brother was diag with prostate cancer - in low 30s  Lab Results  Component Value Date   PSA 3.36 07/04/2017   PSA 4.31 (H) 04/22/2016   PSA 2.66 01/18/2014     He had infx in 3/19 -enterococcus feacalis  (even though his ua was neg)  Saw Dr Jerry [name] amox (did not tol augmentin)   Patient Active Problem List   Diagnosis Date Noted  . Dysuria 02/06/2018  . Prostatitis, chronic 02/06/2018  . Chest wall pain 02/06/2018  . Family history of prostate cancer 02/06/2018  . Back pain, thoracic 02/02/2018  . Urine culture positive 12/09/2017  . Right flank pain 12/05/2017  . Prediabetes 01/07/2017  . Former smoker 01/07/2017  . Aortic atherosclerosis ([place]) 01/06/2017  . Elevated PSA 04/24/2016  . Routine general medical examination at a health care facility 08/05/2011  . Prostate cancer screening 08/05/2011  . TINNITUS 09/06/2009  . Hyperglycemia 08/21/2009  . COLONIC POLYPS, HX OF 01/16/2009  .  Obstructive sleep apnea 10/26/2008  . Mixed hyperlipidemia 03/11/2007  . Essential hypertension 03/11/2007  . Allergic rhinitis 03/11/2007  . ASTHMA 03/11/2007   Past Medical History:  Diagnosis Date  . Allergic rhinitis, cause unspecified   . Asthma   . Diabetes mellitus without complication ([place])   . Elbow fracture age 50 or 1   left  . History of kidney stones   . Hx of colonic polyps   . Other and unspecified hyperlipidemia   . Unspecified essential hypertension    Past Surgical History:  Procedure Laterality Date  . 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 CHOLECYSTECTOMY    . KIDNEY STONE SURGERY    . left leg fracture     Social History   Tobacco Use  . Smoking status: Former Smoker    Packs/day: 0.50    Years: 3.00    Pack years: 1.50    Types: Cigarettes    Last attempt to quit: 09/16/1972    Years since quitting: 45.4  . Smokeless  tobacco: Never Used  Substance Use Topics  . Alcohol use: No    Alcohol/week: 0.0 oz  . Drug use: No   Family History  Problem Relation Age of Onset  . Lung cancer Father   . Diabetes Mother   . Pancreatic cancer Paternal Grandfather   . Colon cancer Neg Hx    Allergies  Allergen Reactions  . Augmentin [Amoxicillin-Pot Clavulanate] Nausea And Vomiting  . Fluticasone Propionate     REACTION: nosebleeds   Current Outpatient Medications on File Prior to Visit  Medication Sig Dispense Refill  . amLODipine (NORVASC) 10 MG tablet TAKE 1 TABLET BY MOUTH ONCE DAILY 90 tablet 2  . loratadine (CLARITIN) 10 MG tablet Take 10 mg by mouth daily.     . Multiple Vitamin (MULTIVITAMIN) capsule Take 1 capsule by mouth daily.       No current facility-administered medications on file prior to visit.      Review of Systems  Constitutional: Positive for fatigue. Negative for activity change, appetite change, fever and unexpected weight change.  HENT: Negative for congestion, rhinorrhea, sore throat and trouble swallowing.   Eyes: Negative for pain, redness, itching and visual disturbance.  Respiratory: Negative for cough, chest tightness, shortness of breath and wheezing.        Pain with deep inspiration  Cardiovascular: Negative for chest pain and palpitations.  Gastrointestinal: Negative for abdominal pain, blood in stool, constipation, diarrhea and nausea.  Endocrine: Negative for cold intolerance, heat intolerance, polydipsia and polyuria.  Genitourinary: Positive for dysuria and flank pain. Negative for difficulty urinating, frequency, hematuria, testicular pain and urgency.  Musculoskeletal: Negative for arthralgias, joint swelling and myalgias.       Chest wall pain  TS pain   Skin: Negative for pallor and rash.  Neurological: Negative for dizziness, tremors, weakness, numbness and headaches.  Hematological: Negative for adenopathy. Does not bruise/bleed easily.    Psychiatric/Behavioral: Negative for decreased concentration and dysphoric mood. The patient is not nervous/anxious.        Objective:   Physical Exam  Constitutional: He appears well-developed and well-nourished. No distress.  Well appearing   HENT:  Head: Normocephalic and atraumatic.  Mouth/Throat: Oropharynx is clear and moist.  Eyes: Pupils are equal, round, and reactive to light. Conjunctivae and EOM are normal.  Neck: Normal range of motion. Neck supple. No JVD present. Carotid bruit is not present. No thyromegaly present.  Cardiovascular: Normal rate, regular rhythm, normal  heart sounds and intact distal pulses. Exam reveals no gallop.  Pulmonary/Chest: Effort normal and breath sounds normal. No respiratory distress. He has no wheezes. He has no rales.  No crackles  Abdominal: Soft. Bowel sounds are normal. He exhibits no distension, no abdominal bruit and no mass. There is no tenderness.  No suprapubic tenderness or fullness   No cva tenderness   Musculoskeletal: He exhibits tenderness. He exhibits no edema or deformity.  Tender in L lateral ribs and TS muscular tightness and tenderness  Nl rom    Lymphadenopathy:    He has no cervical adenopathy.  Neurological: He is alert. He has normal reflexes. He displays normal reflexes. He exhibits normal muscle tone. Coordination normal.  Skin: Skin is warm and dry. No rash noted.  Psychiatric: He has a normal mood and affect.  Pleasant           Assessment & Plan:   Problem List Items Addressed This Visit      Cardiovascular and Mediastinum   Essential hypertension    bp in fair control at this time  BP Readings from Last 1 Encounters:  02/06/18 (!) 148/80  (higher than usual for him because he is in pain)  No changes needed Most recent labs reviewed  Disc lifstyle change with low sodium diet and exercise          Genitourinary   Prostatitis, chronic    Unsure if this is reason for dysuria - ua neg  Last time  he had similar symptoms- had neg ua and pos UC   (enterococcus feacalis)- this was  Send to [employer] [facility] [facility] tx with this  Pend cx  Pt will f/u with his urologist as well         Other   Chest wall pain    Chest wall and back pain with spasm after fall from truck  Improving  Has f/u with orthopedic planned       Dysuria - Primary    Pending ucx Neg ua  Hx of prostatitis  tx with macrobid      Relevant Orders   POCT Urinalysis Dipstick (Automated) (Completed)   Urine Culture (Completed)   Family history of prostate cancer    Brother in his 35s  He has had up /down in psa and sees urology Will f/u

## 2018-02-07 LAB — URINE CULTURE
MICRO NUMBER: 90633479
Result:: NO GROWTH
SPECIMEN QUALITY:: ADEQUATE

## 2018-02-08 NOTE — Assessment & Plan Note (Signed)
Brother in his 32s  He has had up /down in psa and sees urology Will f/u

## 2018-02-08 NOTE — Assessment & Plan Note (Signed)
Chest wall and back pain with spasm after fall from truck  Improving  Has f/u with orthopedic planned

## 2018-02-08 NOTE — Assessment & Plan Note (Signed)
Pending ucx Neg ua  Hx of prostatitis  tx with macrobid

## 2018-02-08 NOTE — Assessment & Plan Note (Signed)
Unsure if this is reason for dysuria - ua neg  Last time he had similar symptoms- had neg ua and pos UC   (enterococcus feacalis)- this was  Send to [employer] /will tx with this  Pend cx  Pt will f/u with his urologist as well

## 2018-02-08 NOTE — Assessment & Plan Note (Signed)
bp in fair control at this time  BP Readings from Last 1 Encounters:  02/06/18 (!) 148/80  (higher than usual for him because he is in pain)  No changes needed Most recent labs reviewed  Disc lifstyle change with low sodium diet and exercise

## 2018-02-15 ENCOUNTER — Telehealth: Payer: Self-pay | Admitting: Family Medicine

## 2018-02-15 DIAGNOSIS — R739 Hyperglycemia, unspecified: Secondary | ICD-10-CM

## 2018-02-15 DIAGNOSIS — Z0000 Encounter for general adult medical examination without abnormal findings: Secondary | ICD-10-CM

## 2018-02-15 DIAGNOSIS — I10 Essential (primary) hypertension: Secondary | ICD-10-CM

## 2018-02-15 DIAGNOSIS — E782 Mixed hyperlipidemia: Secondary | ICD-10-CM

## 2018-02-15 NOTE — Telephone Encounter (Signed)
-----   Message from Lendon Collar, RT sent at 02/10/2018  9:30 AM EDT ----- Regarding: Lab orders for Monday, June 3rd Please enter CPE lab orders for June 3rd. Thanks-Lauren

## 2018-02-16 ENCOUNTER — Other Ambulatory Visit (INDEPENDENT_AMBULATORY_CARE_PROVIDER_SITE_OTHER): Payer: 59

## 2018-02-16 DIAGNOSIS — R739 Hyperglycemia, unspecified: Secondary | ICD-10-CM

## 2018-02-16 DIAGNOSIS — I10 Essential (primary) hypertension: Secondary | ICD-10-CM | POA: Diagnosis not present

## 2018-02-16 DIAGNOSIS — E782 Mixed hyperlipidemia: Secondary | ICD-10-CM | POA: Diagnosis not present

## 2018-02-16 LAB — CBC WITH DIFFERENTIAL/PLATELET
Basophils Absolute: 0.1 10*3/uL (ref 0.0–0.1)
Basophils Relative: 0.8 % (ref 0.0–3.0)
EOS ABS: 0.6 10*3/uL (ref 0.0–0.7)
Eosinophils Relative: 5.8 % — ABNORMAL HIGH (ref 0.0–5.0)
HEMATOCRIT: 42.5 % (ref 39.0–52.0)
HEMOGLOBIN: 14.9 g/dL (ref 13.0–17.0)
LYMPHS PCT: 31.3 % (ref 12.0–46.0)
Lymphs Abs: 3 10*3/uL (ref 0.7–4.0)
MCHC: 35.1 g/dL (ref 30.0–36.0)
MCV: 90 fl (ref 78.0–100.0)
Monocytes Absolute: 0.7 10*3/uL (ref 0.1–1.0)
Monocytes Relative: 7.2 % (ref 3.0–12.0)
Neutro Abs: 5.3 10*3/uL (ref 1.4–7.7)
Neutrophils Relative %: 54.9 % (ref 43.0–77.0)
Platelets: 243 10*3/uL (ref 150.0–400.0)
RBC: 4.72 Mil/uL (ref 4.22–5.81)
RDW: 14.1 % (ref 11.5–15.5)
WBC: 9.7 10*3/uL (ref 4.0–10.5)

## 2018-02-16 LAB — COMPREHENSIVE METABOLIC PANEL
ALBUMIN: 4 g/dL (ref 3.5–5.2)
ALT: 21 U/L (ref 0–53)
AST: 16 U/L (ref 0–37)
Alkaline Phosphatase: 49 U/L (ref 39–117)
BILIRUBIN TOTAL: 0.3 mg/dL (ref 0.2–1.2)
BUN: 15 mg/dL (ref 6–23)
CALCIUM: 9.5 mg/dL (ref 8.4–10.5)
CO2: 30 meq/L (ref 19–32)
CREATININE: 1 mg/dL (ref 0.40–1.50)
Chloride: 104 mEq/L (ref 96–112)
GFR: 80.33 mL/min (ref >60.00)
Glucose, Bld: 174 mg/dL — ABNORMAL HIGH (ref 70–99)
Potassium: 4 mEq/L (ref 3.5–5.1)
Sodium: 140 mEq/L (ref 135–145)
Total Protein: 7.4 g/dL (ref 6.0–8.3)

## 2018-02-16 LAB — LIPID PANEL
CHOL/HDL RATIO: 5
Cholesterol: 211 mg/dL — ABNORMAL HIGH (ref 0–200)
HDL: 40.5 mg/dL (ref >39.00)
LDL Cholesterol: 139 mg/dL — ABNORMAL HIGH (ref 0–99)
NONHDL: 170.42
TRIGLYCERIDES: 158 mg/dL — AB (ref 0.0–149.0)
VLDL: 31.6 mg/dL (ref 0.0–40.0)

## 2018-02-16 LAB — TSH: TSH: 4.24 u[IU]/mL (ref 0.35–4.50)

## 2018-02-16 LAB — HEMOGLOBIN A1C: HEMOGLOBIN A1C: 7 % — AB (ref 4.6–6.5)

## 2018-02-23 ENCOUNTER — Encounter: Payer: 59 | Admitting: Family Medicine

## 2018-03-05 DIAGNOSIS — M940 Chondrocostal junction syndrome [Tietze]: Secondary | ICD-10-CM | POA: Diagnosis not present

## 2018-03-05 DIAGNOSIS — M62830 Muscle spasm of back: Secondary | ICD-10-CM | POA: Diagnosis not present

## 2018-03-30 MED FILL — AMLODIPINE BESYLATE 10 MG T: 10 | 90 days supply | Qty: 90 | Fill #2

## 2018-04-20 ENCOUNTER — Encounter

## 2018-04-20 ENCOUNTER — Encounter: Payer: Self-pay | Admitting: Family Medicine

## 2018-04-20 ENCOUNTER — Ambulatory Visit (INDEPENDENT_AMBULATORY_CARE_PROVIDER_SITE_OTHER): Payer: 59 | Admitting: Family Medicine

## 2018-04-20 VITALS — BP 132/76 | HR 50 | Temp 98.0°F | Ht 68.25 in | Wt 191.5 lb

## 2018-04-20 DIAGNOSIS — Z8042 Family history of malignant neoplasm of prostate: Secondary | ICD-10-CM

## 2018-04-20 DIAGNOSIS — R739 Hyperglycemia, unspecified: Secondary | ICD-10-CM | POA: Diagnosis not present

## 2018-04-20 DIAGNOSIS — Z0000 Encounter for general adult medical examination without abnormal findings: Secondary | ICD-10-CM | POA: Diagnosis not present

## 2018-04-20 DIAGNOSIS — I10 Essential (primary) hypertension: Secondary | ICD-10-CM

## 2018-04-20 DIAGNOSIS — E782 Mixed hyperlipidemia: Secondary | ICD-10-CM

## 2018-04-20 DIAGNOSIS — R7303 Prediabetes: Secondary | ICD-10-CM | POA: Diagnosis not present

## 2018-04-20 DIAGNOSIS — I700 Atherosclerosis of aorta: Secondary | ICD-10-CM

## 2018-04-20 DIAGNOSIS — Z125 Encounter for screening for malignant neoplasm of prostate: Secondary | ICD-10-CM | POA: Diagnosis not present

## 2018-04-20 MED ORDER — AMLODIPINE BESYLATE 10 MG PO TABS: 10.0000 mg | ORAL_TABLET | Freq: Every day | ORAL | 3 refills | Status: DC

## 2018-04-20 NOTE — Assessment & Plan Note (Signed)
Aim to work on cholesterol and bp control

## 2018-04-20 NOTE — Assessment & Plan Note (Signed)
A1C is up into the DM range Lab Results  Component Value Date   HGBA1C 7.0 (H) 02/16/2018   Wants to try diet /exercise before tx for DM Disc foot/eye care  Disc low glycemic diet and exercise for wt loss and glucose control F/u in 3 mo for re check

## 2018-04-20 NOTE — Progress Notes (Signed)
Subjective:    Patient ID: Jerry [name], male    DOB: Oct 27, 1955, 62 y.o.   MRN: [id number]  HPI Here for health maintenance exam and to review chronic medical problems    Wt Readings from Last 3 Encounters:  04/20/18 191 lb 8 oz (86.9 kg)  02/06/18 189 lb 8 oz (86 kg)  01/31/18 187 lb (84.8 kg)  overall stable  Eating -not great  Exercise- 1-3 times per week / schedule is tough  28.90 kg/m   Flu shot-will get in the fall   Colonoscopy 10/16-hyperplastic polyp  5 y recall  No bowel problems    Tetanus shot 11/12  Prostate health Hx of prostatitis (not active right now ) Once in a while at night he had trouble emptying bladder -not that often  On a regular night gets up once  Brother had prostate cancer in 48s He sees urology as needed - thinks he saw them - April/may (the NP for infection)  Plans to make an appt  No medications  Lab Results  Component Value Date   PSA 3.36 07/04/2017   PSA 4.31 (H) 04/22/2016   PSA 2.66 01/18/2014    Zoster status -interested in shingrix   bp is stable today  No cp or palpitations or headaches or edema  No side effects to medicines  BP Readings from Last 3 Encounters:  04/20/18 132/76  02/06/18 (!) 148/80  01/31/18 130/77     Hyperlipidemia Lab Results  Component Value Date   CHOL 211 (H) 02/16/2018   CHOL 203 (H) 07/04/2017   CHOL 195 04/22/2016   Lab Results  Component Value Date   HDL 40.50 02/16/2018   HDL 40.20 07/04/2017   HDL 45.40 04/22/2016   Lab Results  Component Value Date   LDLCALC 139 (H) 02/16/2018   LDLCALC 113 (H) 04/22/2016   LDLCALC 119 (H) 01/18/2014   Lab Results  Component Value Date   TRIG 158.0 (H) 02/16/2018   TRIG 246.0 (H) 07/04/2017   TRIG 182.0 (H) 04/22/2016   Lab Results  Component Value Date   CHOLHDL 5 02/16/2018   CHOLHDL 5 07/04/2017   CHOLHDL 4 04/22/2016   Lab Results  Component Value Date   LDLDIRECT 129.0 07/04/2017   LDLDIRECT 160.9 09/03/2012   LDLDIRECT 151.0 08/05/2011  LDL went up 10 points  More fatty dairy products  Thinks he can get back on track   Just recently started exercise again  Had fallen and hurt back    Hyperglycemia Lab Results  Component Value Date   HGBA1C 7.0 (H) 02/16/2018  6.1 a year ago  He is eating too many sweets -at least multiple times per week   Some itching on sole of R foot- it peels at times   Review of Systems  Constitutional: Negative for activity change, appetite change, fatigue, fever and unexpected weight change.  HENT: Negative for congestion, rhinorrhea, sore throat and trouble swallowing.   Eyes: Negative for pain, redness, itching and visual disturbance.  Respiratory: Negative for cough, chest tightness, shortness of breath and wheezing.   Cardiovascular: Negative for chest pain and palpitations.  Gastrointestinal: Negative for abdominal pain, blood in stool, constipation, diarrhea and nausea.  Endocrine: Negative for cold intolerance, heat intolerance, polydipsia and polyuria.  Genitourinary: Negative for difficulty urinating, dysuria, frequency and urgency.  Musculoskeletal: Negative for arthralgias, joint swelling and myalgias.  Skin: Negative for pallor and rash.       Itching R foot  Neurological: Negative for  dizziness, tremors, weakness, numbness and headaches.  Hematological: Negative for adenopathy. Does not bruise/bleed easily.  Psychiatric/Behavioral: Negative for decreased concentration and dysphoric mood. The patient is not nervous/anxious.        Objective:   Physical Exam  Constitutional: He appears well-developed and well-nourished. No distress.  overwt and well app  HENT:  Head: Normocephalic and atraumatic.  Right Ear: External ear normal.  Left Ear: External ear normal.  Nose: Nose normal.  Mouth/Throat: Oropharynx is clear and moist.  Eyes: Pupils are equal, round, and reactive to light. Conjunctivae and EOM are normal. Right eye exhibits no discharge.  Left eye exhibits no discharge. No scleral icterus.  Neck: Normal range of motion. Neck supple. No JVD present. Carotid bruit is not present. No thyromegaly present.  Cardiovascular: Normal rate, regular rhythm, normal heart sounds and intact distal pulses. Exam reveals no gallop.  Pulmonary/Chest: Effort normal and breath sounds normal. No respiratory distress. He has no wheezes. He has no rales. He exhibits no tenderness.  Abdominal: Soft. Bowel sounds are normal. He exhibits no distension, no abdominal bruit and no mass. There is no tenderness.  Musculoskeletal: He exhibits no edema or tenderness.  Lymphadenopathy:    He has no cervical adenopathy.  Neurological: He is alert. He has normal reflexes. He displays normal reflexes. No cranial nerve deficit. He exhibits normal muscle tone. Coordination normal.  Skin: Skin is warm and dry. No rash noted. No erythema. No pallor.  Solar lentigines diffusely  Mildly tanned  Mark on back from fall this year- improving  Psychiatric: He has a normal mood and affect.  Pleasant           Assessment & Plan:   Problem List Items Addressed This Visit      Cardiovascular and Mediastinum   Aortic atherosclerosis ([place])    Aim to work on cholesterol and bp control       Relevant Medications   amLODipine (NORVASC) 10 MG tablet   Essential hypertension   Relevant Medications   amLODipine (NORVASC) 10 MG tablet     Other   Family history of prostate cancer    No clinical changes for pt  Enc him to f/u yearly with urology  He will schedule his own appt       RESOLVED: Hyperglycemia   Relevant Orders   Hemoglobin A1c   Mixed hyperlipidemia    Disc goals for lipids and reasons to control them Rev last labs with pt Rev low sat fat diet in detail Will re check in 6 mo along with A1C  May be candidate for statin       Relevant Medications   amLODipine (NORVASC) 10 MG tablet   Other Relevant Orders   Lipid panel   Prediabetes    A1C is  up into the DM range Lab Results  Component Value Date   HGBA1C 7.0 (H) 02/16/2018   Wants to try diet /exercise before tx for DM Disc foot/eye care  Disc low glycemic diet and exercise for wt loss and glucose control F/u in 3 mo for re check       Relevant Orders   Hemoglobin A1c   Prostate cancer screening    Lab Results  Component Value Date   PSA 3.36 07/04/2017   PSA 4.31 (H) 04/22/2016   PSA 2.66 01/18/2014   Due for urology f/u H/o chronic prostatitis Now has fam hx of prostate cancer-brother at 60      Routine general medical examination at  a health care facility - Primary    Reviewed health habits including diet and exercise and skin cancer prevention Reviewed appropriate screening tests for age  Also reviewed health mt list, fam hx and immunization status , as well as social and family history   See HPI Labs rev  Disc diet/exercise changes  Also shingrix vaccine  F/u with urology for prostate surveillance

## 2018-04-20 NOTE — Assessment & Plan Note (Signed)
Disc goals for lipids and reasons to control them Rev last labs with pt Rev low sat fat diet in detail Will re check in 6 mo along with A1C  May be candidate for statin

## 2018-04-20 NOTE — Assessment & Plan Note (Signed)
No clinical changes for pt  Enc him to f/u yearly with urology  He will schedule his own appt

## 2018-04-20 NOTE — Patient Instructions (Addendum)
Get a flu shot in the fall   If you are interested in the new shingles vaccine (Shingrix) - call your local pharmacy to check on coverage and availability  If affordable - get on waiting list   Try to get a yearly visit with the urologist (in light of family history)   For cholesterol  Avoid red meat/ fried foods/ egg yolks/ fatty breakfast meats/ butter, cheese and high fat dairy/ and shellfish     your A1C is way up to 7.0 ! Try to get most of your carbohydrates from produce (with the exception of white potatoes)  Eat less bread/pasta/rice/snack foods/cereals/sweets and other items from the middle of the grocery store (processed carbs)   Follow up in 3 months  Aim for 5 days of exercise per week

## 2018-04-20 NOTE — Assessment & Plan Note (Signed)
Reviewed health habits including diet and exercise and skin cancer prevention Reviewed appropriate screening tests for age  Also reviewed health mt list, fam hx and immunization status , as well as social and family history   See HPI Labs rev  Disc diet/exercise changes  Also shingrix vaccine  F/u with urology for prostate surveillance

## 2018-04-20 NOTE — Assessment & Plan Note (Signed)
Lab Results  Component Value Date   PSA 3.36 07/04/2017   PSA 4.31 (H) 04/22/2016   PSA 2.66 01/18/2014   Due for urology f/u H/o chronic prostatitis Now has fam hx of prostate cancer-brother at 25

## 2018-06-30 MED FILL — AMLODIPINE BESYLATE 10 MG T: 10 | 90 days supply | Qty: 90 | Fill #0

## 2018-07-20 ENCOUNTER — Other Ambulatory Visit (INDEPENDENT_AMBULATORY_CARE_PROVIDER_SITE_OTHER): Payer: 59

## 2018-07-20 DIAGNOSIS — R7303 Prediabetes: Secondary | ICD-10-CM | POA: Diagnosis not present

## 2018-07-20 DIAGNOSIS — E782 Mixed hyperlipidemia: Secondary | ICD-10-CM | POA: Diagnosis not present

## 2018-07-20 DIAGNOSIS — R739 Hyperglycemia, unspecified: Secondary | ICD-10-CM | POA: Diagnosis not present

## 2018-07-20 LAB — LIPID PANEL
CHOL/HDL RATIO: 5
Cholesterol: 192 mg/dL (ref 0–200)
HDL: 39.3 mg/dL (ref >39.00)
LDL Cholesterol: 114 mg/dL — ABNORMAL HIGH (ref 0–99)
NonHDL: 152.96
TRIGLYCERIDES: 196 mg/dL — AB (ref 0.0–149.0)
VLDL: 39.2 mg/dL (ref 0.0–40.0)

## 2018-07-20 LAB — HEMOGLOBIN A1C: Hgb A1c MFr Bld: 6.6 % — ABNORMAL HIGH (ref 4.6–6.5)

## 2018-07-27 ENCOUNTER — Ambulatory Visit: Payer: 59 | Admitting: Family Medicine

## 2018-07-27 ENCOUNTER — Encounter: Payer: Self-pay | Admitting: Family Medicine

## 2018-07-27 VITALS — BP 142/82 | HR 60 | Temp 97.7°F | Ht 68.25 in | Wt 194.3 lb

## 2018-07-27 DIAGNOSIS — I10 Essential (primary) hypertension: Secondary | ICD-10-CM

## 2018-07-27 DIAGNOSIS — E782 Mixed hyperlipidemia: Secondary | ICD-10-CM

## 2018-07-27 DIAGNOSIS — R7303 Prediabetes: Secondary | ICD-10-CM

## 2018-07-27 DIAGNOSIS — Z23 Encounter for immunization: Secondary | ICD-10-CM | POA: Diagnosis not present

## 2018-07-27 NOTE — Assessment & Plan Note (Signed)
Lab Results  Component Value Date   HGBA1C 6.6 (H) 07/20/2018   Will work harder on diet (no sweets) as well as exercise (treadmill)  Declines medication  F/u in 3 mo with lab prior  Disc low glycemic diet

## 2018-07-27 NOTE — Patient Instructions (Addendum)
Use treadmill for exercise - work up to 30 or more minutes 5 d per week   For cholesterol Avoid red meat/ fried foods/ egg yolks/ fatty breakfast meats/ butter, cheese and high fat dairy/ and shellfish    For diabetes prevention Try to get most of your carbohydrates from produce (with the exception of white potatoes)  Eat less bread/pasta/rice/snack foods/cereals/sweets and other items from the middle of the grocery store (processed carbs)   I think cutting out sweets are important   Blood pressure is high  Please work on diet and exercise and weight loss   Flu shot today   Follow up in 3 months

## 2018-07-27 NOTE — Progress Notes (Signed)
Subjective:    Patient ID: Jerry [name], male    DOB: [birth year], 62 y.o.   MRN: [id number]  HPI Here for f/u of chronic health problems  Feeling good  Very busy   Wt Readings from Last 3 Encounters:  07/27/18 194 lb 5 oz (88.1 kg)  04/20/18 191 lb 8 oz (86.9 kg)  02/06/18 189 lb 8 oz (86 kg)  up 3 lb  Her thinks is stays w/in 1-2 lb usually  29.33 kg/m   Flu shot -given today   bp is up on first check today No cp or palpitations or headaches or edema  No side effects to medicines  BP Readings from Last 3 Encounters:  07/27/18 (!) 142/82  04/20/18 132/76  02/06/18 (!) 148/80    Takes amlodipine   Exercise- not very regularly  Has a treadmill - in house -will start using    Hyperlipidemia  Lab Results  Component Value Date   CHOL 192 07/20/2018   CHOL 211 (H) 02/16/2018   CHOL 203 (H) 07/04/2017   Lab Results  Component Value Date   HDL 39.30 07/20/2018   HDL 40.50 02/16/2018   HDL 40.20 07/04/2017   Lab Results  Component Value Date   LDLCALC 114 (H) 07/20/2018   LDLCALC 139 (H) 02/16/2018   LDLCALC 113 (H) 04/22/2016   Lab Results  Component Value Date   TRIG 196.0 (H) 07/20/2018   TRIG 158.0 (H) 02/16/2018   TRIG 246.0 (H) 07/04/2017   Lab Results  Component Value Date   CHOLHDL 5 07/20/2018   CHOLHDL 5 02/16/2018   CHOLHDL 5 07/04/2017   Lab Results  Component Value Date   LDLDIRECT 129.0 07/04/2017   LDLDIRECT 160.9 09/03/2012   LDLDIRECT 151.0 08/05/2011   Diet controlled  More exercise  Watching diet  Staying away from fried foods and eating more fresh fruit/veg  Wants to cut back on fatty dairy  occ eats sausage and bacon    Prediabetes Lab Results  Component Value Date   HGBA1C 6.6 (H) 07/20/2018   This is down from 7.0  Sweets- occasionally /still too often  No sugar drinks  Tries to cut back on bread/pasta/rice  Chooses whole wheat bread   Low back pain /mild - R lumbar muscles  Plans to return to  chiropractor  Also injured L knee when splitting wood  Now improved -is babying it   Patient Active Problem List   Diagnosis Date Noted  . Prostatitis, chronic 02/06/2018  . Family history of prostate cancer 02/06/2018  . Prediabetes 01/07/2017  . Former smoker 01/07/2017  . Aortic atherosclerosis ([place]) 01/06/2017  . Elevated PSA 04/24/2016  . Routine general medical examination at a health care facility 08/05/2011  . Prostate cancer screening 08/05/2011  . TINNITUS 09/06/2009  . COLONIC POLYPS, HX OF 01/16/2009  . Obstructive sleep apnea 10/26/2008  . Mixed hyperlipidemia 03/11/2007  . Essential hypertension 03/11/2007  . Allergic rhinitis 03/11/2007  . ASTHMA 03/11/2007   Past Medical History:  Diagnosis Date  . Allergic rhinitis, cause unspecified   . Asthma   . Diabetes mellitus without complication ([place])   . Elbow fracture age 59 or 66   left  . History of kidney stones   . Hx of colonic polyps   . Other and unspecified hyperlipidemia   . Unspecified essential hypertension    Past Surgical History:  Procedure Laterality Date  . 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 CHOLECYSTECTOMY    .  KIDNEY STONE SURGERY    . left leg fracture     Social History   Tobacco Use  . Smoking status: Former Smoker    Packs/day: 0.50    Years: 3.00    Pack years: 1.50    Types: Cigarettes    Last attempt to quit: 09/16/1972    Years since quitting: 45.8  . Smokeless tobacco: Never Used  Substance Use Topics  . Alcohol use: No    Alcohol/week: 0.0 standard drinks  . Drug use: No   Family History  Problem Relation Age of Onset  . Lung cancer Father   . Diabetes Mother   . Pancreatic cancer Paternal Grandfather   . Colon cancer Neg Hx    Allergies  Allergen Reactions  . Augmentin [Amoxicillin-Pot Clavulanate] Nausea And Vomiting  . Fluticasone Propionate     REACTION: nosebleeds   Current Outpatient Medications on File Prior to Visit  Medication Sig  Dispense Refill  . amLODipine (NORVASC) 10 MG tablet Take 1 tablet (10 mg total) by mouth daily. 90 tablet 3  . loratadine (CLARITIN) 10 MG tablet Take 10 mg by mouth daily.     . Multiple Vitamin (MULTIVITAMIN) capsule Take 1 capsule by mouth daily.       No current facility-administered medications on file prior to visit.     Review of Systems  Constitutional: Negative for activity change, appetite change, fatigue, fever and unexpected weight change.  HENT: Negative for congestion, rhinorrhea, sore throat and trouble swallowing.   Eyes: Negative for pain, redness, itching and visual disturbance.  Respiratory: Negative for cough, chest tightness, shortness of breath and wheezing.   Cardiovascular: Negative for chest pain and palpitations.  Gastrointestinal: Negative for abdominal pain, blood in stool, constipation, diarrhea and nausea.  Endocrine: Negative for cold intolerance, heat intolerance, polydipsia and polyuria.  Genitourinary: Negative for difficulty urinating, dysuria, frequency and urgency.  Musculoskeletal: Positive for back pain. Negative for arthralgias, joint swelling and myalgias.       Low back pain -R side/soreness intermittent   Injured knee- better now (hurt it splitting wood)   Skin: Negative for pallor and rash.  Neurological: Negative for dizziness, tremors, weakness, numbness and headaches.  Hematological: Negative for adenopathy. Does not bruise/bleed easily.  Psychiatric/Behavioral: Negative for decreased concentration and dysphoric mood. The patient is not nervous/anxious.        Good mood       Objective:   Physical Exam  Constitutional: He appears well-developed and well-nourished. No distress.  overwt and well app   HENT:  Head: Normocephalic and atraumatic.  Mouth/Throat: Oropharynx is clear and moist.  Eyes: Pupils are equal, round, and reactive to light. Conjunctivae and EOM are normal.  Neck: Normal range of motion. Neck supple. No JVD present.  Carotid bruit is not present. No thyromegaly present.  Cardiovascular: Normal rate, regular rhythm, normal heart sounds and intact distal pulses. Exam reveals no gallop.  Pulmonary/Chest: Effort normal and breath sounds normal. No respiratory distress. He has no wheezes. He has no rales.  No crackles  Abdominal: Soft. Bowel sounds are normal. He exhibits no distension, no abdominal bruit and no mass. There is no tenderness.  Musculoskeletal: He exhibits tenderness. He exhibits no edema.  Mild tenderness over R lumbar musculature with spasm   Lymphadenopathy:    He has no cervical adenopathy.  Neurological: He is alert. He has normal reflexes. He displays normal reflexes. No cranial nerve deficit. Coordination normal.  Skin: Skin is warm and dry. No  rash noted.  Psychiatric: He has a normal mood and affect.  Cheerful           Assessment & Plan:   Problem List Items Addressed This Visit      Cardiovascular and Mediastinum   Essential hypertension    bp is not in optimal control  BP Readings from Last 1 Encounters:  07/27/18 (!) 142/82  taking amlodipine Declines addition of more medication currently  Will re check 3 mo and discuss again  Most recent labs reviewed  Disc lifstyle change with low sodium diet and exercise          Other   Mixed hyperlipidemia    Commended on improvement in LDL  Not quite at goal Declines medication right now -wants to work for 3 more mo on low trans/sat fat diet (also exercise)  Disc goals for lipids and reasons to control them Rev last labs with pt Rev low sat fat diet in detail  Lab and f/u in 3 mo  If A1C is in the DM range-needs to be on statin         Prediabetes - Primary    Lab Results  Component Value Date   HGBA1C 6.6 (H) 07/20/2018   Will work harder on diet (no sweets) as well as exercise (treadmill)  Declines medication  F/u in 3 mo with lab prior  Disc low glycemic diet        Other Visit Diagnoses    Need for  prophylactic vaccination and inoculation against influenza       Relevant Orders   Flu Vaccine QUAD 6+ mos PF IM (Fluarix Quad PF) (Completed)

## 2018-07-27 NOTE — Assessment & Plan Note (Signed)
bp is not in optimal control  BP Readings from Last 1 Encounters:  07/27/18 (!) 142/82  taking amlodipine Declines addition of more medication currently  Will re check 3 mo and discuss again  Most recent labs reviewed  Disc lifstyle change with low sodium diet and exercise

## 2018-07-27 NOTE — Assessment & Plan Note (Signed)
Commended on improvement in LDL  Not quite at goal Declines medication right now -wants to work for 3 more mo on low trans/sat fat diet (also exercise)  Disc goals for lipids and reasons to control them Rev last labs with pt Rev low sat fat diet in detail  Lab and f/u in 3 mo  If A1C is in the DM range-needs to be on statin

## 2018-09-22 DIAGNOSIS — H5203 Hypermetropia, bilateral: Secondary | ICD-10-CM | POA: Diagnosis not present

## 2018-09-22 DIAGNOSIS — H25011 Cortical age-related cataract, right eye: Secondary | ICD-10-CM | POA: Diagnosis not present

## 2018-09-22 DIAGNOSIS — H524 Presbyopia: Secondary | ICD-10-CM | POA: Diagnosis not present

## 2018-09-30 MED FILL — AMLODIPINE BESYLATE 10 MG T: 10 | 90 days supply | Qty: 90 | Fill #1

## 2018-10-05 LAB — HM DIABETES EYE EXAM

## 2018-10-19 ENCOUNTER — Other Ambulatory Visit: Payer: 59

## 2018-10-26 ENCOUNTER — Ambulatory Visit: Payer: 59 | Admitting: Family Medicine

## 2018-12-29 ENCOUNTER — Telehealth: Payer: Self-pay

## 2018-12-29 NOTE — Telephone Encounter (Signed)
Called patient from Recall list.  Patient was agreeable to a video visit.      Virtual Visit Pre-Appointment Phone Call  Steps For Call:  1. Confirm consent - "In the setting of the current Covid19 crisis, you are scheduled for a VIDEO visit with your provider on 12/31/2018 at 10:40am.  Just as we do with many in-office visits, in order for you to participate in this visit, we must obtain consent.  If you'd like, I can send this to your mychart (if signed up) or email for you to review.  Otherwise, I can obtain your verbal consent now.  All virtual visits are billed to your insurance company just like a normal visit would be.  By agreeing to a virtual visit, we'd like you to understand that the technology does not allow for your provider to perform an examination, and thus may limit your provider's ability to fully assess your condition.  Finally, though the technology is pretty good, we cannot assure that it will always work on either your or our end, and in the setting of a video visit, we may have to convert it to a phone-only visit.  In either situation, we cannot ensure that we have a secure connection.  Are you willing to proceed?" STAFF: Did the patient verbally acknowledge consent to telehealth visit? YES  2. Confirm the BEST phone number to call the day of the visit: YES  3. Give patient instructions for WebEx/MyChart download to smartphone as below or Doximity/Doxy.me if video visit (depending on what platform provider is using)  4. Advise patient to be prepared with any vital sign or heart rhythm information, their current medicines, and a piece of paper and pen handy for any instructions they may receive the day of their visit  5. Inform patient they will receive a phone call 15 minutes prior to their appointment time (may be from unknown caller ID) so they should be prepared to answer  6. Confirm that appointment type is correct in Epic appointment notes (video vs  telephone)     TELEPHONE CALL NOTE  [name] has been deemed a candidate for a follow-up tele-health visit to limit community exposure during the Covid-19 pandemic. I spoke with the patient via phone to ensure availability of phone/video source, confirm preferred email & phone number, and discuss instructions and expectations.  I reminded Jerry [name] to be prepared with any vital sign and/or heart rhythm information that could potentially be obtained via home monitoring, at the time of his visit. I reminded Jerry [name] to expect a phone call at the time of his visit if his visit.  Janan Ridge, [place] 12/29/2018 2:35 PM   DOWNLOADING THE WEBEX APP TO SMARTPHONE  - If Apple, ask patient to go to [employer] and type in WebEx in the search bar. [employer], the blue/green circle. If Android, go to [employer] and type in [employer] in the search bar. The app is free but as with any other app downloads, their phone may require them to verify saved payment information or Apple/Android password.  - The patient does NOT have to create an account. - On the day of the visit, the assist will walk the patient through joining the meeting with the meeting number/password.  DOWNLOADING THE MYCHART APP TO SMARTPHONE  - If Apple, go to [employer] and type in MyChart in the search bar and download the app. If Android, ask patient to go  to [employer] and type in [place] in the search bar and download the app. The app is free but as with any other app downloads, their phone may require them to verify saved payment information or Apple/Android password.  - The patient will need to then log into the app with their MyChart username and password, and select [facility] as their healthcare provider to link the account. When it is time for your visit, go to the MyChart app, find appointments, and click Begin Video Visit. Be sure to Select Allow for your device to access the  Microphone and Camera for your visit. You will then be connected, and your provider will be with you shortly.  **If they have any issues connecting, or need assistance please contact [facility] [phone])**  **If using a computer, in order to ensure the best quality for your visit they will need to use either of the following Internet Browsers: [employer] [place], or Google Chrome**  [place]   I hereby voluntarily request, consent and authorize [facility] and its employed or contracted physicians, physician assistants, nurse practitioners or other licensed health care professionals (the Practitioner), to provide me with telemedicine health care services (the Services") as deemed necessary by the treating Practitioner. I acknowledge and consent to receive the Services by the Practitioner via telemedicine. I understand that the telemedicine visit will involve communicating with the Practitioner through live audiovisual communication technology and the disclosure of certain medical information by electronic transmission. I acknowledge that I have been given the opportunity to request an in-person assessment or other available alternative prior to the telemedicine visit and am voluntarily participating in the telemedicine visit.  I understand that I have the right to withhold or withdraw my consent to the use of telemedicine in the course of my care at any time, without affecting my right to future care or treatment, and that the Practitioner or I may terminate the telemedicine visit at any time. I understand that I have the right to inspect all information obtained and/or recorded in the course of the telemedicine visit and may receive copies of available information for a reasonable fee.  I understand that some of the potential risks of receiving the Services via telemedicine include:   Delay or interruption in medical evaluation due to  technological equipment failure or disruption;  Information transmitted may not be sufficient (e.g. poor resolution of images) to allow for appropriate medical decision making by the Practitioner; and/or   In rare instances, security protocols could fail, causing a breach of personal health information.  Furthermore, I acknowledge that it is my responsibility to provide information about my medical history, conditions and care that is complete and accurate to the best of my ability. I acknowledge that Practitioner's advice, recommendations, and/or decision may be based on factors not within their control, such as incomplete or inaccurate data provided by me or distortions of diagnostic images or specimens that may result from electronic transmissions. I understand that the practice of medicine is not an exact science and that Practitioner makes no warranties or guarantees regarding treatment outcomes. I acknowledge that I will receive a copy of this consent concurrently upon execution via email to the email address I last provided but may also request a printed copy by calling the office of [place].    I understand that my insurance will be billed for this visit.   I have read or had this consent read to  me.  I understand the contents of this consent, which adequately explains the benefits and risks of the Services being provided via telemedicine.   I have been provided ample opportunity to ask questions regarding this consent and the Services and have had my questions answered to my satisfaction.  I give my informed consent for the services to be provided through the use of telemedicine in my medical care  By participating in this telemedicine visit I agree to the above.

## 2018-12-30 NOTE — Progress Notes (Signed)
Virtual Visit via Telephone Note   This visit type was conducted due to national recommendations for restrictions regarding the COVID-19 Pandemic (e.g. social distancing) in an effort to limit this patient's exposure and mitigate transmission in our community.  Due to her co-morbid illnesses, this patient is at least at moderate risk for complications without adequate follow up.  This format is felt to be most appropriate for this patient at this time.  The patient did not have access to video technology/had technical difficulties with video requiring transitioning to audio format only (telephone).  All issues noted in this document were discussed and addressed.  No physical exam could be performed with this format.  Please refer to the patient's chart for her  consent to telehealth for [facility].    I connected with  [name] on 12/30/18 by a video enabled telemedicine application and verified that I am speaking with the correct person using two identifiers. I discussed the limitations of evaluation and management by telemedicine. The patient expressed understanding and agreed to proceed.   Evaluation Performed:  Follow-up visit  Date:  12/30/2018   ID:  Jerry [name], Jerry [name] 1956-05-31, MRN [id number]  Patient Location:  [place] [address]   Provider location:   [facility], [place] office  PCP:  Abner Greenspan, MD  Cardiologist:  Patsy Baltimore   Chief Complaint:  Getting over bronchitis, PNA    History of Present Illness:    Jerry [name] is a 63 y.o. male who presents via audio/video conferencing for a telehealth visit today.   The patient does not symptoms concerning for COVID-19 infection (fever, chills, cough, or new SHORTNESS OF BREATH).   Patient has a past medical history of Former smoker, teenager only Aortic athero on CXR 12/2016 HTN,  pVCs on EKG hyperlipidemia,  borderline dm  Seen in the ER for chest pain  12/18/2016 OSA uses CPAP Who presents for follow up of his chest pain symptoms  Getting over bronchitis, PNA Still recovering Does yard work,  No regular exercise  Had GERD on asa 81 daily and NSAIDS  Labs reviewed on today's visit HBA1C 6.6, down from a 7.0 Total chol 192, LDL 114  Reviewed prior imaging from 2018 Chest x-ray showing aortic atherosclerosis. 12/2016    In the emergency room With chest pain April 2018 EKG with normal sinus rhythm borderline Q waves inferior leads concerning for old inferior MI Troponin negative 2 BMP with potassium 3.  Prior CV studies:   The following studies were reviewed today:  Other past medical history reviewed He reports that 12/18/2016, he had finished bowling with his family  returning his shoes when he had acute onset central chest pressure with shortness of breath.   episode lasted about 5 minutes.  No radiation of his pain moderate in severity  Reports pain went away when he sat in the car on the way to the emergency room He did develop a headache no additional discomfort  in the emergency department.   Past Medical History:  Diagnosis Date  . Allergic rhinitis, cause unspecified   . Asthma   . Diabetes mellitus without complication ([place])   . Elbow fracture age 57 or 50   left  . History of kidney stones   . Hx of colonic polyps   . Other and unspecified hyperlipidemia   . Unspecified essential hypertension    Past Surgical History:  Procedure Laterality Date  . carotid  cavernous fistula  11/2009   surgery  . CHOLECYSTECTOMY    . KIDNEY STONE SURGERY    . left leg fracture       No outpatient medications have been marked as taking for the 12/31/18 encounter (Appointment) with Minna Merritts, MD.     Allergies:   Augmentin [amoxicillin-pot clavulanate] and Fluticasone propionate   Social History   Tobacco Use  . Smoking status: Former Smoker    Packs/day: 0.50    Years: 3.00    Pack years: 1.50     Types: Cigarettes    Last attempt to quit: 09/16/1972    Years since quitting: 46.3  . Smokeless tobacco: Never Used  Substance Use Topics  . Alcohol use: No    Alcohol/week: 0.0 standard drinks  . Drug use: No     Current Outpatient Medications on File Prior to Visit  Medication Sig Dispense Refill  . amLODipine (NORVASC) 10 MG tablet Take 1 tablet (10 mg total) by mouth daily. 90 tablet 3  . loratadine (CLARITIN) 10 MG tablet Take 10 mg by mouth daily.     . Multiple Vitamin (MULTIVITAMIN) capsule Take 1 capsule by mouth daily.       No current facility-administered medications on file prior to visit.      Family Hx: The patient's family history includes Diabetes in his mother; Lung cancer in his father; Pancreatic cancer in his paternal grandfather. There is no history of Colon cancer.  ROS:   Please see the history of present illness.    Review of Systems  Constitutional: Negative.   Respiratory: Negative.   Cardiovascular: Negative.   Gastrointestinal: Negative.   Musculoskeletal: Negative.   Neurological: Negative.   Psychiatric/Behavioral: Negative.   All other systems reviewed and are negative.    Labs/Other Tests and Data Reviewed:    Recent Labs: 02/16/2018: ALT 21; BUN 15; Creatinine, Ser 1.00; Hemoglobin 14.9; Platelets 243.0; Potassium 4.0; Sodium 140; TSH 4.24   Recent Lipid Panel Lab Results  Component Value Date/Time   CHOL 192 07/20/2018 08:49 AM   TRIG 196.0 (H) 07/20/2018 08:49 AM   HDL 39.30 07/20/2018 08:49 AM   CHOLHDL 5 07/20/2018 08:49 AM   LDLCALC 114 (H) 07/20/2018 08:49 AM   LDLDIRECT 129.0 07/04/2017 10:37 AM    Wt Readings from Last 3 Encounters:  07/27/18 194 lb 5 oz (88.1 kg)  04/20/18 191 lb 8 oz (86.9 kg)  02/06/18 189 lb 8 oz (86 kg)     Exam:    Vital Signs: Vital signs may also be detailed in the HPI There were no vitals taken for this visit.  Wt Readings from Last 3 Encounters:  07/27/18 194 lb 5 oz (88.1 kg)  04/20/18  191 lb 8 oz (86.9 kg)  02/06/18 189 lb 8 oz (86 kg)   Temp Readings from Last 3 Encounters:  07/27/18 97.7 F (36.5 C) (Oral)  04/20/18 98 F (36.7 C) (Oral)  02/06/18 98.1 F (36.7 C) (Oral)   BP Readings from Last 3 Encounters:  07/27/18 (!) 142/82  04/20/18 132/76  02/06/18 (!) 148/80   Pulse Readings from Last 3 Encounters:  07/27/18 60  04/20/18 (!) 50  02/06/18 63    130/70, pulse 60 resp 16  Well nourished, well developed male in no acute distress. Constitutional:  oriented to person, place, and time. No distress.     ASSESSMENT & PLAN:    chest pain - No sx at this time Previously seen in the emergency  room April 2018 No further work-up at this time Consider asa 81 mg daily  Aortic atherosclerosis (HCC) - Seen on chest x-ray  Previously declined CT coronary calcium scoring Declining statin at this time Ideally LDL should be less than 70 given aortic atherosclerosis, certainly less than 820 given diabetes.  This was discussed with him in detail  Essential hypertension BP stable  Mixed hyperlipidemia -  Declining a statin at this time Suggest he talk with Dr. Glori Bickers  Former smoker stop smoking many years ago , when he was a teenager stable  Borderline diabetes Should be on a statin, declining at this time Also with aortic atherosclerosis on CXR Goal LDL <70  Palpitations Previously with  rare PVCs  seen on  EKG No a significant issue at this time  GERD: Has symptoms with any NSAIDs Might need PPI   COVID-19 Education: The signs and symptoms of COVID-19 were discussed with the patient and how to seek care for testing (follow up with PCP or arrange E-visit).  The importance of social distancing was discussed today.  Patient Risk:   After full review of this patients clinical status, I feel that they are at least moderate risk at this time.  Time:   Today, I have spent 25 minutes with the patient with telehealth technology discussing  the cardiac and medical problems/diagnoses detailed above   10 min spent reviewing the chart prior to patient visit today   Medication Adjustments/Labs and Tests Ordered: Current medicines are reviewed at length with the patient today.  Concerns regarding medicines are outlined above.   Tests Ordered: No tests ordered   Medication Changes: No changes made   Disposition: Follow-up as needed  Signed, Ida Rogue, MD  12/30/2018 5:24 PM    [facility] [address], [place], [place] [address]

## 2018-12-31 ENCOUNTER — Other Ambulatory Visit: Payer: Self-pay

## 2018-12-31 ENCOUNTER — Telehealth (INDEPENDENT_AMBULATORY_CARE_PROVIDER_SITE_OTHER): Payer: 59 | Admitting: Cardiovascular Disease

## 2018-12-31 DIAGNOSIS — I700 Atherosclerosis of aorta: Secondary | ICD-10-CM

## 2018-12-31 DIAGNOSIS — I10 Essential (primary) hypertension: Secondary | ICD-10-CM | POA: Diagnosis not present

## 2018-12-31 DIAGNOSIS — R7303 Prediabetes: Secondary | ICD-10-CM | POA: Diagnosis not present

## 2018-12-31 DIAGNOSIS — E782 Mixed hyperlipidemia: Secondary | ICD-10-CM

## 2018-12-31 DIAGNOSIS — Z87891 Personal history of nicotine dependence: Secondary | ICD-10-CM

## 2018-12-31 DIAGNOSIS — G4733 Obstructive sleep apnea (adult) (pediatric): Secondary | ICD-10-CM

## 2018-12-31 NOTE — Patient Instructions (Addendum)
Medication Instructions:  Please talk with Dr. Glori Bickers  about starting a cholesterol medication  If you need a refill on your cardiac medications before your next appointment, please call your pharmacy.    Lab work: No new labs needed   If you have labs (blood work) drawn today and your tests are completely normal, you will receive your results only by: Marland Kitchen MyChart Message (if you have MyChart) OR . A paper copy in the mail If you have any lab test that is abnormal or we need to change your treatment, we will call you to review the results.   Testing/Procedures: No new testing needed   Follow-Up: At [facility], you and your health needs are our priority.  As part of our continuing mission to provide you with exceptional heart care, we have created designated Provider Care Teams.  These Care Teams include your primary Cardiologist (physician) and Advanced Practice Providers (APPs -  Physician Assistants and Nurse Practitioners) who all work together to provide you with the care you need, when you need it.  . You will need a follow up appointment as needed  . Providers on your designated Care Team:   . Murray Hodgkins, NP . Christell Faith, PA-C . Marrianne Mood, PA-C  Any Other Special Instructions Will Be Listed Below (If Applicable).  For educational health videos Log in to : [email or web address] Or : [email or web address], password : [email or web address]

## 2019-01-18 ENCOUNTER — Other Ambulatory Visit: Payer: 59

## 2019-01-18 ENCOUNTER — Other Ambulatory Visit: Payer: Self-pay

## 2019-01-20 ENCOUNTER — Other Ambulatory Visit (INDEPENDENT_AMBULATORY_CARE_PROVIDER_SITE_OTHER): Payer: 59

## 2019-01-20 DIAGNOSIS — I10 Essential (primary) hypertension: Secondary | ICD-10-CM | POA: Diagnosis not present

## 2019-01-20 DIAGNOSIS — E782 Mixed hyperlipidemia: Secondary | ICD-10-CM

## 2019-01-20 DIAGNOSIS — R7303 Prediabetes: Secondary | ICD-10-CM

## 2019-01-20 LAB — COMPREHENSIVE METABOLIC PANEL
ALT: 26 U/L (ref 0–53)
AST: 19 U/L (ref 0–37)
Albumin: 4.5 g/dL (ref 3.5–5.2)
Alkaline Phosphatase: 35 U/L — ABNORMAL LOW (ref 39–117)
BUN: 18 mg/dL (ref 6–23)
CO2: 30 mEq/L (ref 19–32)
Calcium: 9.5 mg/dL (ref 8.4–10.5)
Chloride: 102 mEq/L (ref 96–112)
Creatinine, Ser: 0.97 mg/dL (ref 0.40–1.50)
GFR: 78.05 mL/min (ref >60.00)
Glucose, Bld: 130 mg/dL — ABNORMAL HIGH (ref 70–99)
Potassium: 3.7 mEq/L (ref 3.5–5.1)
Sodium: 141 mEq/L (ref 135–145)
Total Bilirubin: 0.5 mg/dL (ref 0.2–1.2)
Total Protein: 7.7 g/dL (ref 6.0–8.3)

## 2019-01-20 LAB — HEMOGLOBIN A1C: Hgb A1c MFr Bld: 7 % — ABNORMAL HIGH (ref 4.6–6.5)

## 2019-01-20 LAB — LIPID PANEL
Cholesterol: 207 mg/dL — ABNORMAL HIGH (ref 0–200)
HDL: 43.2 mg/dL (ref >39.00)
LDL Cholesterol: 133 mg/dL — ABNORMAL HIGH (ref 0–99)
NonHDL: 164.15
Total CHOL/HDL Ratio: 5
Triglycerides: 154 mg/dL — ABNORMAL HIGH (ref 0.0–149.0)
VLDL: 30.8 mg/dL (ref 0.0–40.0)

## 2019-01-25 ENCOUNTER — Encounter: Payer: Self-pay | Admitting: Family Medicine

## 2019-01-25 ENCOUNTER — Ambulatory Visit (INDEPENDENT_AMBULATORY_CARE_PROVIDER_SITE_OTHER): Payer: 59 | Admitting: Family Medicine

## 2019-01-25 VITALS — Wt 188.0 lb

## 2019-01-25 DIAGNOSIS — E782 Mixed hyperlipidemia: Secondary | ICD-10-CM | POA: Diagnosis not present

## 2019-01-25 DIAGNOSIS — E119 Type 2 diabetes mellitus without complications: Secondary | ICD-10-CM

## 2019-01-25 DIAGNOSIS — I10 Essential (primary) hypertension: Secondary | ICD-10-CM

## 2019-01-25 DIAGNOSIS — I700 Atherosclerosis of aorta: Secondary | ICD-10-CM

## 2019-01-25 MED ORDER — METFORMIN HCL ER 500 MG PO TB24
500.0000 mg | ORAL_TABLET | Freq: Every day | ORAL | 11 refills | Status: DC
Start: 2019-01-25 — End: 2020-01-20

## 2019-01-25 NOTE — Patient Instructions (Signed)
Start metformin xr 500 mg once daily in the am  If you have any side effects or problems let us know   We will re check A1C with a physical in august (will call you to set this up)   Eat healthy Try to get most of your carbohydrates from produce (with the exception of white potatoes)  Eat less bread/pasta/rice/snack foods/cereals/sweets and other items from the middle of the grocery store (processed carbs) Avoid red meat/ fried foods/ egg yolks/ fatty breakfast meats/ butter, cheese and high fat dairy/ and shellfish  Keep exercising and loosing weight

## 2019-01-25 NOTE — Assessment & Plan Note (Signed)
bp is stable today  No cp or palpitations or headaches or edema  No side effects to medicines  BP Readings from Last 3 Encounters:  07/27/18 (!) 142/82  04/20/18 132/76  02/06/18 (!) 148/80

## 2019-01-25 NOTE — Assessment & Plan Note (Signed)
Disc goals for lipids and reasons to control them Rev last labs with pt Rev low sat fat diet in detail LDL of 133 Needs statin in light of DM2 He wants to wait until after we start DM medication  Working on diet

## 2019-01-25 NOTE — Assessment & Plan Note (Signed)
No symptoms  Goal is good bp and cholesterol control

## 2019-01-25 NOTE — Progress Notes (Signed)
Virtual Visit via Video Note  I connected with [place] on 01/25/19 at  8:00 AM EDT by a video enabled telemedicine application and verified that I am speaking with the correct person using two identifiers.  Location: Patient: home Provider: office    I discussed the limitations of evaluation and management by telemedicine and the availability of in person appointments. The patient expressed understanding and agreed to proceed.  History of Present Illness: Pt presents for f/u of chronic medical problems   Has been doing well/feeling fine   Had pneumonia in feb followed by a uti - took a while to get over that   Weight at home improved! Working on it [employer] from Last 3 Encounters:  07/27/18 194 lb 5 oz (88.1 kg)  04/20/18 191 lb 8 oz (86.9 kg)  02/06/18 189 lb 8 oz (86 kg)  yesterday am was 188 lb   HTN- has not checked it - thinks it is stable/ does not feel any symptoms of high or low  No cp or palpitations or headaches or edema  No side effects to medicines  BP Readings from Last 3 Encounters:  07/27/18 (!) 142/82  04/20/18 132/76  02/06/18 (!) 148/80    Taking amlodipine 10 mg daily    Hyperlipidemia Lab Results  Component Value Date   CHOL 207 (H) 01/20/2019   CHOL 192 07/20/2018   CHOL 211 (H) 02/16/2018   Lab Results  Component Value Date   HDL 43.20 01/20/2019   HDL 39.30 07/20/2018   HDL 40.50 02/16/2018   Lab Results  Component Value Date   LDLCALC 133 (H) 01/20/2019   LDLCALC 114 (H) 07/20/2018   LDLCALC 139 (H) 02/16/2018   Lab Results  Component Value Date   TRIG 154.0 (H) 01/20/2019   TRIG 196.0 (H) 07/20/2018   TRIG 158.0 (H) 02/16/2018   Lab Results  Component Value Date   CHOLHDL 5 01/20/2019   CHOLHDL 5 07/20/2018   CHOLHDL 5 02/16/2018   Lab Results  Component Value Date   LDLDIRECT 129.0 07/04/2017   LDLDIRECT 160.9 09/03/2012   LDLDIRECT 151.0 08/05/2011  exercise - has been exercising more  Walking about 35  minutes 4 times per week  Not eating more fatty foods at all     Prediabetes Lab Results  Component Value Date   HGBA1C 7.0 (H) 01/20/2019  this is up significantly from 6.6  He thinks it was up when he had infections - uti/pneumonia  Finally getting over it  Avoiding sugar and white flour   Bread -cutting back  Pasta Rice White potato   Portion sizes are less  Eating 2 meals per day with a lot of snacks  Sometimes eats at night   He has not been on metformin   Foot care -good/no issues   Eye exam was January 2020 No problems from DM Had some glaucoma -early- watching that closely   Review of Systems  Constitutional: Positive for weight loss. Negative for chills, fever and malaise/fatigue.  Eyes: Negative for blurred vision, discharge and redness.  Respiratory: Negative for cough and shortness of breath.   Cardiovascular: Negative for chest pain and palpitations.  Gastrointestinal: Negative for abdominal pain, nausea and vomiting.  Genitourinary: Negative for frequency.  Musculoskeletal: Negative for myalgias.  Skin: Negative for rash.  Neurological: Negative for dizziness and headaches.  Endo/Heme/Allergies: Negative for polydipsia. Does not bruise/bleed easily.  Psychiatric/Behavioral: Negative for depression. The patient is not nervous/anxious.     Patient  Active Problem List   Diagnosis Date Noted  . Prostatitis, chronic 02/06/2018  . Family history of prostate cancer 02/06/2018  . Type 2 diabetes mellitus without complication, with no history of insulin use ([place]) 01/07/2017  . Former smoker 01/07/2017  . Aortic atherosclerosis ([place]) 01/06/2017  . Elevated PSA 04/24/2016  . Routine general medical examination at a health care facility 08/05/2011  . Prostate cancer screening 08/05/2011  . TINNITUS 09/06/2009  . COLONIC POLYPS, HX OF 01/16/2009  . Obstructive sleep apnea 10/26/2008  . Mixed hyperlipidemia 03/11/2007  . Essential hypertension 03/11/2007  .  Allergic rhinitis 03/11/2007  . ASTHMA 03/11/2007   Past Medical History:  Diagnosis Date  . Allergic rhinitis, cause unspecified   . Asthma   . Diabetes mellitus without complication ([place])   . Elbow fracture age 70 or 2   left  . History of kidney stones   . Hx of colonic polyps   . Other and unspecified hyperlipidemia   . Unspecified essential hypertension    Past Surgical History:  Procedure Laterality Date  . 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 CHOLECYSTECTOMY    . KIDNEY STONE SURGERY    . left leg fracture     Social History   Tobacco Use  . Smoking status: Former Smoker    Packs/day: 0.50    Years: 3.00    Pack years: 1.50    Types: Cigarettes    Last attempt to quit: 09/16/1972    Years since quitting: 46.3  . Smokeless tobacco: Never Used  Substance Use Topics  . Alcohol use: No    Alcohol/week: 0.0 standard drinks  . Drug use: No   Family History  Problem Relation Age of Onset  . Lung cancer Father   . Diabetes Mother   . Pancreatic cancer Paternal Grandfather   . Colon cancer Neg Hx    Allergies  Allergen Reactions  . Augmentin [Amoxicillin-Pot Clavulanate] Nausea And Vomiting  . Fluticasone Propionate     REACTION: nosebleeds   Current Outpatient Medications on File Prior to Visit  Medication Sig Dispense Refill  . amLODipine (NORVASC) 10 MG tablet Take 1 tablet (10 mg total) by mouth daily. 90 tablet 3  . loratadine (CLARITIN) 10 MG tablet Take 10 mg by mouth daily.     . Multiple Vitamin (MULTIVITAMIN) capsule Take 1 capsule by mouth daily.       No current facility-administered medications on file prior to visit.     Observations/Objective: Pt appears well/his usual self  Overweight  Pleasant with nl affect  Mentally sharp and talkative  No facial swelling or asymmetry  Not hoarse/no slurred speech No skin color change or rash apparent  Not sob No cough during interview   Assessment and Plan: Problem List Items Addressed  This Visit      Cardiovascular and Mediastinum   Essential hypertension    bp is stable today  No cp or palpitations or headaches or edema  No side effects to medicines  BP Readings from Last 3 Encounters:  07/27/18 (!) 142/82  04/20/18 132/76  02/06/18 (!) 148/80          Aortic atherosclerosis (HCC)    No symptoms  Goal is good bp and cholesterol control         Endocrine   Type 2 diabetes mellitus without complication, with no history of insulin use ([place]) - Primary    Lab Results  Component Value Date   HGBA1C 7.0 (H)  01/20/2019   This is up significantly  Pt agreeable to start low dose metformin xr at 500 mg once daily  Disc poss side eff incl GI -will update  Rev low glycemic diet and plan for exercise  F/u aug  Enc good foot care Eye exam 1/20       Relevant Medications   metFORMIN (GLUCOPHAGE-XR) 500 MG 24 hr tablet     Other   Mixed hyperlipidemia    Disc goals for lipids and reasons to control them Rev last labs with pt Rev low sat fat diet in detail LDL of 133 Needs statin in light of DM2 He wants to wait until after we start DM medication  Working on diet           Follow Up Instructions: Start metformin xr 500 mg once daily in the am  If you have any side effects or problems let us know   We will re check A1C with a physical in august (will call you to set this up)   Eat healthy Try to get most of your carbohydrates from produce (with the exception of white potatoes)  Eat less bread/pasta/rice/snack foods/cereals/sweets and other items from the middle of the grocery store (processed carbs) Avoid red meat/ fried foods/ egg yolks/ fatty breakfast meats/ butter, cheese and high fat dairy/ and shellfish  Keep exercising and loosing weight    I discussed the assessment and treatment plan with the patient. The patient was provided an opportunity to ask questions and all were answered. The patient agreed with the plan and demonstrated an  understanding of the instructions.   The patient was advised to call back or seek an in-person evaluation if the symptoms worsen or if the condition fails to improve as anticipated.    Loura Pardon, MD

## 2019-01-25 NOTE — Assessment & Plan Note (Signed)
Lab Results  Component Value Date   HGBA1C 7.0 (H) 01/20/2019   This is up significantly  Pt agreeable to start low dose metformin xr at 500 mg once daily  Disc poss side eff incl GI -will update  Rev low glycemic diet and plan for exercise  F/u aug  Enc good foot care Eye exam 1/20

## 2019-04-18 ENCOUNTER — Telehealth: Payer: Self-pay | Admitting: Family Medicine

## 2019-04-18 DIAGNOSIS — E119 Type 2 diabetes mellitus without complications: Secondary | ICD-10-CM

## 2019-04-18 DIAGNOSIS — I10 Essential (primary) hypertension: Secondary | ICD-10-CM

## 2019-04-18 DIAGNOSIS — Z0000 Encounter for general adult medical examination without abnormal findings: Secondary | ICD-10-CM

## 2019-04-18 DIAGNOSIS — R972 Elevated prostate specific antigen [PSA]: Secondary | ICD-10-CM

## 2019-04-18 DIAGNOSIS — Z125 Encounter for screening for malignant neoplasm of prostate: Secondary | ICD-10-CM

## 2019-04-18 DIAGNOSIS — E782 Mixed hyperlipidemia: Secondary | ICD-10-CM

## 2019-04-18 NOTE — Telephone Encounter (Signed)
-----   Message from Ellamae Sia sent at 04/14/2019 10:10 AM EDT ----- Regarding: Lab orders for Monday, 8.3.20 Patient is scheduled for CPX labs, please order future labs, Thanks , Karna Christmas

## 2019-04-19 ENCOUNTER — Other Ambulatory Visit: Payer: 59

## 2019-04-26 ENCOUNTER — Encounter: Payer: 59 | Admitting: Family Medicine

## 2019-04-30 ENCOUNTER — Other Ambulatory Visit: Payer: Self-pay | Admitting: Family Medicine

## 2019-05-03 ENCOUNTER — Other Ambulatory Visit: Payer: 59

## 2019-05-07 ENCOUNTER — Emergency Department (HOSPITAL_COMMUNITY): Payer: 59

## 2019-05-07 ENCOUNTER — Encounter (HOSPITAL_COMMUNITY): Payer: Self-pay | Admitting: Emergency Medicine

## 2019-05-07 ENCOUNTER — Emergency Department (HOSPITAL_COMMUNITY)
Admission: EM | Admit: 2019-05-07 | Discharge: 2019-05-07 | Disposition: A | Discharge status: Home / Self Care | Payer: 59 | Attending: Emergency Medicine | Admitting: Emergency Medicine

## 2019-05-07 DIAGNOSIS — Z79899 Other long term (current) drug therapy: Secondary | ICD-10-CM | POA: Insufficient documentation

## 2019-05-07 DIAGNOSIS — R079 Chest pain, unspecified: Secondary | ICD-10-CM | POA: Diagnosis not present

## 2019-05-07 DIAGNOSIS — S0181XA Laceration without foreign body of other part of head, initial encounter: Secondary | ICD-10-CM | POA: Insufficient documentation

## 2019-05-07 DIAGNOSIS — Z23 Encounter for immunization: Secondary | ICD-10-CM | POA: Insufficient documentation

## 2019-05-07 DIAGNOSIS — I10 Essential (primary) hypertension: Secondary | ICD-10-CM | POA: Diagnosis not present

## 2019-05-07 DIAGNOSIS — S0990XA Unspecified injury of head, initial encounter: Secondary | ICD-10-CM | POA: Diagnosis not present

## 2019-05-07 DIAGNOSIS — E119 Type 2 diabetes mellitus without complications: Secondary | ICD-10-CM | POA: Diagnosis not present

## 2019-05-07 DIAGNOSIS — Y998 Other external cause status: Secondary | ICD-10-CM | POA: Diagnosis not present

## 2019-05-07 DIAGNOSIS — R51 Headache: Secondary | ICD-10-CM | POA: Diagnosis not present

## 2019-05-07 DIAGNOSIS — S299XXA Unspecified injury of thorax, initial encounter: Secondary | ICD-10-CM | POA: Diagnosis not present

## 2019-05-07 DIAGNOSIS — S199XXA Unspecified injury of neck, initial encounter: Secondary | ICD-10-CM | POA: Diagnosis not present

## 2019-05-07 DIAGNOSIS — Y93I9 Activity, other involving external motion: Secondary | ICD-10-CM | POA: Diagnosis not present

## 2019-05-07 DIAGNOSIS — Y92410 Unspecified street and highway as the place of occurrence of the external cause: Secondary | ICD-10-CM | POA: Diagnosis not present

## 2019-05-07 MED ORDER — TETANUS-DIPHTH-ACELL PERTUSSIS 5-2.5-18.5 LF-MCG/0.5 IM SUSP
0.5000 mL | Freq: Once | INTRAMUSCULAR | Status: AC
  Administered 2019-05-07: 17:00:00 0.5 mL via INTRAMUSCULAR
  Filled 2019-05-07: qty 0.5

## 2019-05-07 MED ORDER — LIDOCAINE-EPINEPHRINE (PF) 2 %-1:200000 IJ SOLN
20.0000 mL | Freq: Once | INTRAMUSCULAR | Status: AC
  Administered 2019-05-07: 20 mL via INTRADERMAL
  Filled 2019-05-07: qty 20
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
## 2019-05-07 NOTE — Discharge Instructions (Signed)
Keep area clean by washing with soap and water daily. Do not submerge in water or scrub stitches Apply a bandage at least once daily, change more often if it is dirty Watch for signs of infection (redness, drainage, worsening pain) Take Tylenol or Ibuprofen for pain as needed Have stitches removed in 5-7 days
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
## 2019-06-16 ENCOUNTER — Telehealth: Payer: Self-pay | Admitting: Family Medicine

## 2019-06-16 NOTE — Telephone Encounter (Signed)
Pt notified of Dr. Tower's comments and verbalized understanding  

## 2019-06-16 NOTE — Telephone Encounter (Signed)
Called pt and gave him the testing site info. He will go today or tomorrow at the latest. Lab appt cancelled per pt request until we get Covid results back.  Pt did have a few questions he wanted to ask Dr. Glori Bickers. Pt ask that if his covid test is negative can he still develop covid from the exposure. Pt said he was around his bother last on on 06/11/19. 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 Pt's lab appt for Monday was cancelled but he also question if he should reschedule labs and CPE until after the 14 day incubation period or if the negative result means that he is okay

## 2019-06-16 NOTE — Telephone Encounter (Signed)
Patient's brother tested positive for covid.  His brother started feeling bad on Sunday.  Patient went to see his brother last Friday and spent an hour with his brother inside and an hour outside with him.  Patient wants to know if he should be concerned. Patient has lab work at our office scheduled on Monday and cpx with Dr.Tower on 06/25/19.

## 2019-06-16 NOTE — Telephone Encounter (Signed)
The incubation period for covid is 14 days at the longest (but most folks get it within 4-5 days after exposure)  It is safer to wait the 14 days for church and appointment Please re schedule any appts here within that time  I will watch out for result

## 2019-06-17 ENCOUNTER — Other Ambulatory Visit: Payer: Self-pay

## 2019-06-17 DIAGNOSIS — Z20822 Contact with and (suspected) exposure to covid-19: Secondary | ICD-10-CM

## 2019-06-17 DIAGNOSIS — Z20828 Contact with and (suspected) exposure to other viral communicable diseases: Secondary | ICD-10-CM | POA: Diagnosis not present

## 2019-06-18 LAB — NOVEL CORONAVIRUS, NAA: SARS-CoV-2, NAA: NOT DETECTED

## 2019-06-21 ENCOUNTER — Other Ambulatory Visit: Payer: 59

## 2019-06-25 ENCOUNTER — Encounter: Payer: 59 | Admitting: Family Medicine

## 2019-06-25 DIAGNOSIS — Z0289 Encounter for other administrative examinations: Secondary | ICD-10-CM

## 2019-08-22 ENCOUNTER — Other Ambulatory Visit: Payer: Self-pay

## 2019-08-22 ENCOUNTER — Emergency Department
Admission: EM | Admit: 2019-08-22 | Discharge: 2019-08-23 | Disposition: A | Discharge status: Home / Self Care | Payer: 59 | Attending: Student in an Organized Health Care Education/Training Program | Admitting: Student in an Organized Health Care Education/Training Program

## 2019-08-22 ENCOUNTER — Emergency Department: Payer: 59

## 2019-08-22 DIAGNOSIS — Z7984 Long term (current) use of oral hypoglycemic drugs: Secondary | ICD-10-CM | POA: Insufficient documentation

## 2019-08-22 DIAGNOSIS — E119 Type 2 diabetes mellitus without complications: Secondary | ICD-10-CM | POA: Insufficient documentation

## 2019-08-22 DIAGNOSIS — S82862A Displaced Maisonneuve's fracture of left leg, initial encounter for closed fracture: Secondary | ICD-10-CM | POA: Diagnosis not present

## 2019-08-22 DIAGNOSIS — Z87891 Personal history of nicotine dependence: Secondary | ICD-10-CM | POA: Diagnosis not present

## 2019-08-22 DIAGNOSIS — J45909 Unspecified asthma, uncomplicated: Secondary | ICD-10-CM | POA: Diagnosis not present

## 2019-08-22 DIAGNOSIS — M7989 Other specified soft tissue disorders: Secondary | ICD-10-CM | POA: Diagnosis not present

## 2019-08-22 DIAGNOSIS — W010XXA Fall on same level from slipping, tripping and stumbling without subsequent striking against object, initial encounter: Secondary | ICD-10-CM | POA: Diagnosis not present

## 2019-08-22 DIAGNOSIS — Y999 Unspecified external cause status: Secondary | ICD-10-CM | POA: Insufficient documentation

## 2019-08-22 DIAGNOSIS — Y9301 Activity, walking, marching and hiking: Secondary | ICD-10-CM | POA: Insufficient documentation

## 2019-08-22 DIAGNOSIS — S82832A Other fracture of upper and lower end of left fibula, initial encounter for closed fracture: Secondary | ICD-10-CM | POA: Diagnosis not present

## 2019-08-22 DIAGNOSIS — Y929 Unspecified place or not applicable: Secondary | ICD-10-CM | POA: Diagnosis not present

## 2019-08-22 DIAGNOSIS — I10 Essential (primary) hypertension: Secondary | ICD-10-CM | POA: Diagnosis not present

## 2019-08-22 DIAGNOSIS — Z79899 Other long term (current) drug therapy: Secondary | ICD-10-CM | POA: Diagnosis not present

## 2019-08-22 DIAGNOSIS — M25572 Pain in left ankle and joints of left foot: Secondary | ICD-10-CM | POA: Diagnosis not present

## 2019-08-22 DIAGNOSIS — S8992XA Unspecified injury of left lower leg, initial encounter: Secondary | ICD-10-CM | POA: Diagnosis present

## 2019-08-22 MED ORDER — OXYCODONE-ACETAMINOPHEN 10-325 MG PO TABS: 1.0000 | ORAL_TABLET | Freq: Four times a day (QID) | ORAL | 0 refills | Status: DC | PRN

## 2019-08-22 MED ORDER — MORPHINE SULFATE (PF) 4 MG/ML IV SOLN
4.0000 mg | Freq: Once | INTRAVENOUS | Status: AC
  Administered 2019-08-22: 4 mg via INTRAVENOUS
  Filled 2019-08-22: qty 1

## 2019-08-22 MED ORDER — FENTANYL CITRATE (PF) 100 MCG/2ML IJ SOLN
100.0000 ug | Freq: Once | INTRAMUSCULAR | Status: AC
  Administered 2019-08-22: 100 ug via INTRAVENOUS
  Filled 2019-08-22: qty 2

## 2019-08-22 MED ORDER — ONDANSETRON 4 MG PO TBDP: 4.0000 mg | ORAL_TABLET | Freq: Three times a day (TID) | ORAL | 0 refills | Status: DC | PRN

## 2019-08-22 MED ORDER — ONDANSETRON HCL 4 MG/2ML IJ SOLN
4.0000 mg | Freq: Once | INTRAMUSCULAR | Status: AC
  Administered 2019-08-22: 4 mg via INTRAVENOUS
  Filled 2019-08-22: qty 2

## 2019-08-22 NOTE — ED Notes (Signed)
Radiology to bedside. 

## 2019-08-22 NOTE — ED Provider Notes (Signed)
[facility] Emergency Department Provider Note ____________________________________________  Time seen: Approximately 10:07 PM  I have reviewed the triage vital signs and the nursing notes.   HISTORY  Chief Complaint Ankle Pain    HPI Jerry [name] is a 63 y.o. male who presents to the emergency department for evaluation and treatment of treatment and evaluation of left ankle pain and fibula pain after inversion injury approximately 20 minutes ago while walking.  He states that the toes of his shoe dug into the gravel and he tripped.  His body went forward and his foot was still planted when he fell. Severe pain. He has had 2 previous tibia and fibula fractures on the same side that did not heal correctly.   Past Medical History:  Diagnosis Date  . Allergic rhinitis, cause unspecified   . Asthma   . Diabetes mellitus without complication ([place])   . Elbow fracture age 22 or 67   left  . History of kidney stones   . Hx of colonic polyps   . Other and unspecified hyperlipidemia   . Unspecified essential hypertension     Patient Active Problem List   Diagnosis Date Noted  . Prostatitis, chronic 02/06/2018  . Family history of prostate cancer 02/06/2018  . Type 2 diabetes mellitus without complication, with no history of insulin use ([place]) 01/07/2017  . Former smoker 01/07/2017  . Aortic atherosclerosis ([place]) 01/06/2017  . Elevated PSA 04/24/2016  . Routine general medical examination at a health care facility 08/05/2011  . Prostate cancer screening 08/05/2011  . TINNITUS 09/06/2009  . COLONIC POLYPS, HX OF 01/16/2009  . Obstructive sleep apnea 10/26/2008  . Mixed hyperlipidemia 03/11/2007  . Essential hypertension 03/11/2007  . Allergic rhinitis 03/11/2007  . ASTHMA 03/11/2007    Past Surgical History:  Procedure Laterality Date  . 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 CHOLECYSTECTOMY    . KIDNEY STONE SURGERY    . left leg fracture       Prior to Admission medications   Medication Sig Start Date End Date Taking? Authorizing Provider  amLODipine (NORVASC) 10 MG tablet TAKE 1 TABLET BY MOUTH EVERY DAY Patient taking differently: Take 10 mg by mouth daily.  04/30/19   Tower, Wynelle Fanny, MD  Ascorbic Acid (VITAMIN C) 100 MG tablet Take 100 mg by mouth daily.    [provider]  ELDERBERRY PO Take 1 tablet by mouth daily.    [provider]  loratadine (CLARITIN) 10 MG tablet Take 10 mg by mouth daily.     [provider]  metFORMIN (GLUCOPHAGE-XR) 500 MG 24 hr tablet Take 1 tablet (500 mg total) by mouth daily with breakfast. 01/25/19   Tower, Wynelle Fanny, MD  Multiple Vitamin (MULTIVITAMIN) capsule Take 1 capsule by mouth daily.      [provider]  Multiple Vitamins-Minerals (ZINC PO) Take 1 tablet by mouth daily.    [provider]  ondansetron (ZOFRAN-ODT) 4 MG disintegrating tablet Take 1 tablet (4 mg total) by mouth every 8 (eight) hours as needed for nausea or vomiting. 08/22/19   Monet North B, FNP  OVER THE COUNTER MEDICATION Take 1 tablet by mouth daily. Nugenix    [provider]  oxyCODONE-acetaminophen (PERCOCET) 10-325 MG tablet Take 1 tablet by mouth every 6 (six) hours as needed for pain. 08/22/19 08/21/20  Mariacristina Aday, Johnette Abraham B, FNP  TURMERIC CURCUMIN PO Take 1 tablet by mouth daily.    [provider]  Allergies Augmentin [amoxicillin-pot clavulanate] and Fluticasone propionate  Family History  Problem Relation Age of Onset  . Lung cancer Father   . Diabetes Mother   . Pancreatic cancer Paternal Grandfather   . Colon cancer Neg Hx     Social History Social History   Tobacco Use  . Smoking status: Former Smoker    Packs/day: 0.50    Years: 3.00    Pack years: 1.50    Types: Cigarettes    Quit date: 09/16/1972    Years since quitting: 46.9  . Smokeless tobacco: Never Used  Substance Use Topics  . Alcohol use: No    Alcohol/week: 0.0 standard  drinks  . Drug use: No    Review of Systems Constitutional: Negative for fever. Cardiovascular: Negative for chest pain. Respiratory: Negative for shortness of breath. Musculoskeletal: Positive for left leg and ankle pain. Skin: Negative for open wound  Neurological: Negative for decrease in sensation  ____________________________________________   PHYSICAL EXAM:  VITAL SIGNS: ED Triage Vitals [08/22/19 2156]  Enc Vitals Group     BP (!) 160/84     Pulse Rate 69     Resp 17     Temp 98.4 F (36.9 C)     Temp Source Oral     SpO2 100 %     Weight 190 lb (86.2 kg)     Height 5\' 9"  (1.753 m)     Head Circumference      Peak Flow      Pain Score 10     Pain Loc      Pain Edu?      Excl. in [place]?     Constitutional: Alert and oriented. Well appearing and in no acute distress. Eyes: Conjunctivae are clear without discharge or drainage Head: Atraumatic Neck: Supple Respiratory: No cough. Respirations are even and unlabored. Musculoskeletal: Deformity of the left ankle. Neurologic: Motor and sensory function of the left lower extremity intact.  Skin: No open wounds over the left lower extremity.  Psychiatric: Affect and behavior are appropriate.  ____________________________________________   LABS (all labs ordered are listed, but only abnormal results are displayed)  Labs Reviewed - No data to display ____________________________________________  RADIOLOGY  Old healed fractures of the mid to distal tibial and fibular diaphyses.  No acute displaced fracture involving the ankle.  There is widening of the medial talofibular clear space with some soft tissue swelling.  There is also an acute fracture of the proximal fibula. ____________________________________________   PROCEDURES  .Splint Application  Date/Time: 08/22/2019 11:49 PM Performed by: Corliss Skains, NT Authorized by: Victorino Dike, FNP   Consent:    Consent obtained:  Verbal   Consent  given by:  Patient   Risks discussed:  Numbness, pain and swelling Pre-procedure details:    Sensation:  Normal Procedure details:    Laterality:  Left   Location:  Leg   Leg:  L lower leg   Cast type:  Long leg   Supplies:  Cotton padding, elastic bandage and Ortho-Glass Post-procedure details:    Pain:  Unchanged   Sensation:  Normal   Patient tolerance of procedure:  Tolerated well, no immediate complications    ____________________________________________   INITIAL IMPRESSION / ASSESSMENT AND PLAN / ED COURSE  Jerry [name] is a 63 y.o. who presents to the emergency department for treatment and evaluation of left ankle pain and leg pain after a inversion injury prior to arrival.  Exam does show a deformity of  the left ankle.  Pulses are intact.  No open wounds are noted.  Maisonneuve fracture.  Patient will be placed in a long-leg splint he was advised to use either crutches or walker which he states he has at home. He is comfortable being discharged. Pain is fairly well controlled now, but he requests a pain med before discharge since the pharmacy is closed tonight. Norco to be given.  Patient instructed to follow-up with orthopedics and is to call tomorrow to schedule an appointment. He was also instructed to return to the emergency department for symptoms that change or worsen if unable schedule an appointment with orthopedics or primary care.  Medications  HYDROcodone-acetaminophen (NORCO/VICODIN) 5-325 MG per tablet 1 tablet (has no administration in time range)  fentaNYL (SUBLIMAZE) injection 100 mcg (100 mcg Intravenous Given 08/22/19 2219)  morphine 4 MG/ML injection 4 mg (4 mg Intravenous Given 08/22/19 2331)  ondansetron (ZOFRAN) injection 4 mg (4 mg Intravenous Given 08/22/19 2331)    Pertinent labs & imaging results that were available during my care of the patient were reviewed by me and considered in my medical decision making (see chart for  details).  _________________________________________   FINAL CLINICAL IMPRESSION(S) / ED DIAGNOSES  Final diagnoses:  Closed displaced Maisonneuve fracture of left lower extremity, initial encounter    ED Discharge Orders         Ordered    oxyCODONE-acetaminophen (PERCOCET) 10-325 MG tablet  Every 6 hours PRN     08/22/19 2358    ondansetron (ZOFRAN-ODT) 4 MG disintegrating tablet  Every 8 hours PRN     08/22/19 2358           If controlled substance prescribed during this visit, 12 month history viewed on the [place] prior to issuing an initial prescription for Schedule II or III opiod.   Victorino Dike, FNP 08/23/19 0013    Merlyn Lot, MD 08/23/19 1406

## 2019-08-22 NOTE — Discharge Instructions (Signed)
Please call the orthopedic doctor's office in the morning to request a follow-up appointment.  Take your pain medication as prescribed.  Use your crutches or walker.  Return to the emergency department for symptoms of concern if you are unable to see the orthopedist.

## 2019-08-22 NOTE — ED Triage Notes (Signed)
Pt fell and twisted his left ankle about 20 mins ago while walking. Pt ankle swelling and has extreme pain.

## 2019-08-23 MED ORDER — HYDROCODONE-ACETAMINOPHEN 5-325 MG PO TABS
1.0000 | ORAL_TABLET | Freq: Once | ORAL | Status: AC
  Administered 2019-08-23: 1 via ORAL
  Filled 2019-08-23: qty 1

## 2019-08-23 NOTE — ED Notes (Signed)
Esignature pad not working at time of discharge; pt and spouse express verbal understanding of paperwork without any further questions at this time.

## 2019-08-24 DIAGNOSIS — S82831A Other fracture of upper and lower end of right fibula, initial encounter for closed fracture: Secondary | ICD-10-CM | POA: Diagnosis not present

## 2019-09-02 ENCOUNTER — Other Ambulatory Visit: Payer: Self-pay | Admitting: Family Medicine

## 2019-09-06 DIAGNOSIS — S89209A Unspecified physeal fracture of upper end of unspecified fibula, initial encounter for closed fracture: Secondary | ICD-10-CM | POA: Diagnosis not present

## 2019-09-24 DIAGNOSIS — M25572 Pain in left ankle and joints of left foot: Secondary | ICD-10-CM | POA: Diagnosis not present

## 2019-09-26 ENCOUNTER — Other Ambulatory Visit: Payer: Self-pay | Admitting: Family Medicine

## 2019-10-08 DIAGNOSIS — S89209A Unspecified physeal fracture of upper end of unspecified fibula, initial encounter for closed fracture: Secondary | ICD-10-CM | POA: Diagnosis not present

## 2019-10-11 DIAGNOSIS — M25472 Effusion, left ankle: Secondary | ICD-10-CM | POA: Diagnosis not present

## 2019-10-11 DIAGNOSIS — S89209A Unspecified physeal fracture of upper end of unspecified fibula, initial encounter for closed fracture: Secondary | ICD-10-CM | POA: Diagnosis not present

## 2019-10-18 DIAGNOSIS — M25472 Effusion, left ankle: Secondary | ICD-10-CM | POA: Diagnosis not present

## 2019-10-18 DIAGNOSIS — S89209A Unspecified physeal fracture of upper end of unspecified fibula, initial encounter for closed fracture: Secondary | ICD-10-CM | POA: Diagnosis not present

## 2019-10-21 DIAGNOSIS — S89209A Unspecified physeal fracture of upper end of unspecified fibula, initial encounter for closed fracture: Secondary | ICD-10-CM | POA: Diagnosis not present

## 2019-10-21 DIAGNOSIS — M25472 Effusion, left ankle: Secondary | ICD-10-CM | POA: Diagnosis not present

## 2019-10-22 DIAGNOSIS — S89209A Unspecified physeal fracture of upper end of unspecified fibula, initial encounter for closed fracture: Secondary | ICD-10-CM | POA: Diagnosis not present

## 2019-10-23 ENCOUNTER — Other Ambulatory Visit: Payer: Self-pay | Admitting: Family Medicine

## 2019-10-25 DIAGNOSIS — S89209A Unspecified physeal fracture of upper end of unspecified fibula, initial encounter for closed fracture: Secondary | ICD-10-CM | POA: Diagnosis not present

## 2019-10-25 DIAGNOSIS — M25472 Effusion, left ankle: Secondary | ICD-10-CM | POA: Diagnosis not present

## 2019-10-28 DIAGNOSIS — M25472 Effusion, left ankle: Secondary | ICD-10-CM | POA: Diagnosis not present

## 2019-10-28 DIAGNOSIS — S89209A Unspecified physeal fracture of upper end of unspecified fibula, initial encounter for closed fracture: Secondary | ICD-10-CM | POA: Diagnosis not present

## 2019-11-05 ENCOUNTER — Other Ambulatory Visit: Payer: Self-pay | Admitting: Family Medicine

## 2019-11-05 NOTE — Telephone Encounter (Signed)
Pt had a CPE in Aug he cancelled and then he no-showed his CPE in Oct, please advise

## 2019-11-05 NOTE — Telephone Encounter (Signed)
Please re schedule and refill until then Thanks

## 2019-11-05 NOTE — Telephone Encounter (Signed)
Patient called about refill request. He stated that he is completely out of medication

## 2019-11-05 NOTE — Telephone Encounter (Signed)
I attempted to contact patient, but he doesn't have voice mail set up.  I left a detailed message on his wife's voice mail to call back and schedule cpx, so bp medication can be called in to pharmacy.

## 2019-11-08 DIAGNOSIS — M25472 Effusion, left ankle: Secondary | ICD-10-CM | POA: Diagnosis not present

## 2019-11-08 DIAGNOSIS — S89209A Unspecified physeal fracture of upper end of unspecified fibula, initial encounter for closed fracture: Secondary | ICD-10-CM | POA: Diagnosis not present

## 2019-11-12 ENCOUNTER — Ambulatory Visit (INDEPENDENT_AMBULATORY_CARE_PROVIDER_SITE_OTHER): Payer: 59 | Admitting: Family Medicine

## 2019-11-12 ENCOUNTER — Encounter: Payer: Self-pay | Admitting: Family Medicine

## 2019-11-12 ENCOUNTER — Other Ambulatory Visit: Payer: Self-pay

## 2019-11-12 VITALS — BP 134/80 | HR 64 | Temp 97.0°F | Ht 68.0 in | Wt 189.6 lb

## 2019-11-12 DIAGNOSIS — E119 Type 2 diabetes mellitus without complications: Secondary | ICD-10-CM

## 2019-11-12 DIAGNOSIS — Z0000 Encounter for general adult medical examination without abnormal findings: Secondary | ICD-10-CM | POA: Diagnosis not present

## 2019-11-12 DIAGNOSIS — I10 Essential (primary) hypertension: Secondary | ICD-10-CM | POA: Diagnosis not present

## 2019-11-12 DIAGNOSIS — I700 Atherosclerosis of aorta: Secondary | ICD-10-CM | POA: Diagnosis not present

## 2019-11-12 DIAGNOSIS — N411 Chronic prostatitis: Secondary | ICD-10-CM | POA: Diagnosis not present

## 2019-11-12 DIAGNOSIS — E782 Mixed hyperlipidemia: Secondary | ICD-10-CM | POA: Diagnosis not present

## 2019-11-12 DIAGNOSIS — Z125 Encounter for screening for malignant neoplasm of prostate: Secondary | ICD-10-CM

## 2019-11-12 MED ORDER — AMLODIPINE BESYLATE 10 MG PO TABS: 10.0000 mg | ORAL_TABLET | Freq: Every day | ORAL | 11 refills | Status: DC

## 2019-11-12 NOTE — Progress Notes (Signed)
Subjective:    Patient ID: Jerry [name], male    DOB: [birth year], 64 y.o.   MRN: [id number]   This visit occurred during the SARS-CoV-2 public health emergency.  Safety protocols were in place, including screening questions prior to the visit, additional usage of staff PPE, and extensive cleaning of exam room while observing appropriate contact time as indicated for disinfecting solutions.    HPI Here for health maintenance exam and to review chronic medical problems    He was in [facility]  Fractured L ankle in December (turned it walking)  Did not need surgery but was in a cast for 8 weeks and then a boot  Is still recovering  (3rd time he has broken his LLE) Still sore but has full rom  Has 3 more sessions of PT    He had pneumonia last February and he wonders if he had covid  Worst illness he ever had   He does not trust the covid vaccine-does not plan to get it  May change his mind later   Wt Readings from Last 3 Encounters:  11/12/19 189 lb 9 oz (86 kg)  08/22/19 190 lb (86.2 kg)  01/25/19 188 lb (85.3 kg)  wt is stable  28.82 kg/m   Flu vaccine - decided not to   Eye exam 1/20 He had an appt 2 weeks ago- had to leave town and will re schedule  He has a cataract  Having some reading issues   Colonoscopy 10/16 with 5 y recall  Will be due in October    Tdap 8/20     Zoster status - interested in shingrix   Prostate health  H/o prostatitis and elevated psa and family h/o prostate cancer (brother)  Lab Results  Component Value Date   PSA 3.36 07/04/2017   PSA 4.31 (H) 04/22/2016   PSA 2.66 01/18/2014    he needs to f/u with urology (has been monitored regularly)  Will check psa today to forward    bp is stable today  No cp or palpitations or headaches or edema  No side effects to medicines  BP Readings from Last 3 Encounters:  11/12/19 134/80  08/23/19 (!) 144/83  05/07/19 (!) 178/96     Pulse Readings from Last 3 Encounters:  11/12/19  64  08/23/19 66  05/07/19 73   OSA- using cpap  DM2 Eye exam  Foot care  Lab Results  Component Value Date   HGBA1C 7.0 (H) 01/20/2019  this had gone up  Needs labs today Taking metformin xr  Does not check glucose   Diet has been terrible !  Wants to try and get wife on board  = he has to try  Has not been able to exercise since he fractured his leg    Hyperlipidemia  Lab Results  Component Value Date   CHOL 207 (H) 01/20/2019   CHOL 192 07/20/2018   CHOL 211 (H) 02/16/2018   Lab Results  Component Value Date   HDL 43.20 01/20/2019   HDL 39.30 07/20/2018   HDL 40.50 02/16/2018   Lab Results  Component Value Date   LDLCALC 133 (H) 01/20/2019   LDLCALC 114 (H) 07/20/2018   LDLCALC 139 (H) 02/16/2018   Lab Results  Component Value Date   TRIG 154.0 (H) 01/20/2019   TRIG 196.0 (H) 07/20/2018   TRIG 158.0 (H) 02/16/2018   Lab Results  Component Value Date   CHOLHDL 5 01/20/2019   CHOLHDL 5  07/20/2018   CHOLHDL 5 02/16/2018   Lab Results  Component Value Date   LDLDIRECT 129.0 07/04/2017   LDLDIRECT 160.9 09/03/2012   LDLDIRECT 151.0 08/05/2011    ? If he would consider a statin   Patient Active Problem List   Diagnosis Date Noted  . Prostatitis, chronic 02/06/2018  . Family history of prostate cancer 02/06/2018  . Type 2 diabetes mellitus without complication, with no history of insulin use ([place]) 01/07/2017  . Former smoker 01/07/2017  . Aortic atherosclerosis ([place]) 01/06/2017  . Elevated PSA 04/24/2016  . Routine general medical examination at a health care facility 08/05/2011  . Prostate cancer screening 08/05/2011  . TINNITUS 09/06/2009  . COLONIC POLYPS, HX OF 01/16/2009  . Obstructive sleep apnea 10/26/2008  . Mixed hyperlipidemia 03/11/2007  . Essential hypertension 03/11/2007  . Allergic rhinitis 03/11/2007  . ASTHMA 03/11/2007   Past Medical History:  Diagnosis Date  . Allergic rhinitis, cause unspecified   . Asthma   . Diabetes  mellitus without complication ([place])   . Elbow fracture age 48 or 66   left  . History of kidney stones   . Hx of colonic polyps   . Other and unspecified hyperlipidemia   . Unspecified essential hypertension    Past Surgical History:  Procedure Laterality Date  . 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 CHOLECYSTECTOMY    . KIDNEY STONE SURGERY    . left leg fracture     Social History   Tobacco Use  . Smoking status: Former Smoker    Packs/day: 0.50    Years: 3.00    Pack years: 1.50    Types: Cigarettes    Quit date: 09/16/1972    Years since quitting: 47.1  . Smokeless tobacco: Never Used  Substance Use Topics  . Alcohol use: No    Alcohol/week: 0.0 standard drinks  . Drug use: No   Family History  Problem Relation Age of Onset  . Lung cancer Father   . Diabetes Mother   . Pancreatic cancer Paternal Grandfather   . Colon cancer Neg Hx    Allergies  Allergen Reactions  . Augmentin [Amoxicillin-Pot Clavulanate] Nausea And Vomiting  . Fluticasone Propionate     REACTION: nosebleeds   Current Outpatient Medications on File Prior to Visit  Medication Sig Dispense Refill  . Ascorbic Acid (VITAMIN C) 100 MG tablet Take 100 mg by mouth daily.    Marland Kitchen ELDERBERRY PO Take 1 tablet by mouth daily.    Marland Kitchen loratadine (CLARITIN) 10 MG tablet Take 10 mg by mouth daily.     . metFORMIN (GLUCOPHAGE-XR) 500 MG 24 hr tablet Take 1 tablet (500 mg total) by mouth daily with breakfast. 30 tablet 11  . Multiple Vitamin (MULTIVITAMIN) capsule Take 1 capsule by mouth daily.      . Multiple Vitamins-Minerals (ZINC PO) Take 1 tablet by mouth daily.    Marland Kitchen OVER THE COUNTER MEDICATION Take 1 tablet by mouth daily. Nugenix     No current facility-administered medications on file prior to visit.     Review of Systems  Constitutional: Positive for fatigue. Negative for activity change, appetite change, fever and unexpected weight change.  HENT: Negative for congestion, rhinorrhea, sore  throat and trouble swallowing.   Eyes: Negative for pain, redness, itching and visual disturbance.  Respiratory: Negative for cough, chest tightness, shortness of breath and wheezing.   Cardiovascular: Negative for chest pain and palpitations.  Gastrointestinal: Negative for abdominal pain, blood  in stool, constipation, diarrhea and nausea.  Endocrine: Negative for cold intolerance, heat intolerance, polydipsia and polyuria.  Genitourinary: Negative for difficulty urinating, dysuria, frequency and urgency.  Musculoskeletal: Negative for arthralgias, joint swelling and myalgias.       L ankle pain after fracture- doing better   Skin: Negative for pallor and rash.  Neurological: Negative for dizziness, tremors, weakness, numbness and headaches.  Hematological: Negative for adenopathy. Does not bruise/bleed easily.  Psychiatric/Behavioral: Negative for decreased concentration and dysphoric mood. The patient is not nervous/anxious.        Objective:   Physical Exam Constitutional:      General: He is not in acute distress.    Appearance: Normal appearance. He is well-developed. He is not ill-appearing or diaphoretic.     Comments: overwt  HENT:     Head: Normocephalic and atraumatic.     Right Ear: Tympanic membrane, ear canal and external ear normal.     Left Ear: Tympanic membrane, ear canal and external ear normal.     Nose: Nose normal. No congestion.     Mouth/Throat:     Mouth: Mucous membranes are moist.     Pharynx: Oropharynx is clear. No posterior oropharyngeal erythema.  Eyes:     General: No scleral icterus.       Right eye: No discharge.        Left eye: No discharge.     Conjunctiva/sclera: Conjunctivae normal.     Pupils: Pupils are equal, round, and reactive to light.  Neck:     Thyroid: No thyromegaly.     Vascular: No carotid bruit or JVD.  Cardiovascular:     Rate and Rhythm: Normal rate and regular rhythm.     Pulses: Normal pulses.     Heart sounds: Normal  heart sounds. No gallop.   Pulmonary:     Effort: Pulmonary effort is normal. No respiratory distress.     Breath sounds: Normal breath sounds. No wheezing or rales.     Comments: Good air exch Chest:     Chest wall: No tenderness.  Abdominal:     General: Bowel sounds are normal. There is no distension or abdominal bruit.     Palpations: Abdomen is soft. There is no mass.     Tenderness: There is no abdominal tenderness.     Hernia: No hernia is present.  Musculoskeletal:        General: No tenderness.     Cervical back: Normal range of motion and neck supple. No rigidity. No muscular tenderness.     Right lower leg: No edema.     Left lower leg: No edema.  Lymphadenopathy:     Cervical: No cervical adenopathy.  Skin:    General: Skin is warm and dry.     Coloration: Skin is not pale.     Findings: No erythema or rash.     Comments: Lentigines Some SKs  Neurological:     Mental Status: He is alert.     Cranial Nerves: No cranial nerve deficit.     Motor: No weakness or abnormal muscle tone.     Coordination: Coordination normal.     Gait: Gait normal.     Deep Tendon Reflexes: Reflexes are normal and symmetric. Reflexes normal.  Psychiatric:        Mood and Affect: Mood normal.        Cognition and Memory: Cognition and memory normal.           Assessment &  Plan:   Problem List Items Addressed This Visit      Cardiovascular and Mediastinum   Essential hypertension    bp in fair control at this time  BP Readings from Last 1 Encounters:  11/12/19 134/80   No changes needed Most recent labs reviewed  Disc lifstyle change with low sodium diet and exercise  Labs today      Relevant Medications   amLODipine (NORVASC) 10 MG tablet   Other Relevant Orders   CBC with Differential/Platelet (Completed)   Comprehensive metabolic panel (Completed)   Lipid panel (Completed)   Aortic atherosclerosis ([place])    Labs today- would like to add statin  No symptoms  Goal  to control BP and DM      Relevant Medications   amLODipine (NORVASC) 10 MG tablet     Endocrine   Type 2 diabetes mellitus without complication, with no history of insulin use ([facility])    A1C drawn today and microalb ordered Pt does not check glucose Has not benn motivated to change diet- encouraged this strongly  lexx exercise after ankle fx-he can start some walking now  Disc importance of statin-pt is unsure if he would take one       Relevant Orders   Hemoglobin A1c (Completed)   Microalbumin / creatinine urine ratio (Completed)     Genitourinary   Prostatitis, chronic    Due for urology f/u-pt wants to schedule this himself        Other   Mixed hyperlipidemia    Disc goals for lipids and reasons to control them Rev last labs with pt Rev low sat fat diet in detail Lab today Needs to start statin in light of DM - he has been resistant        Relevant Medications   amLODipine (NORVASC) 10 MG tablet   Other Relevant Orders   Lipid panel (Completed)   Routine general medical examination at a health care facility - Primary    Reviewed health habits including diet and exercise and skin cancer prevention Reviewed appropriate screening tests for age  Also reviewed health mt list, fam hx and immunization status , as well as social and family history   See HPI Labs ordered  Strongly enc covid vaccine -pt declines for now  Discussed shingrix vaccine  Pt plans to schedule eye exam and urology f/u Colonoscopy will be due in oct-he is aware  PSA added to labs today  Enc strongly to eat better and start exercise if able       Prostate cancer screening    PSA today  Due for urology f/u for chronic prostatitis  Brother was dx with prostate cancer- also needs screening       Relevant Orders   PSA (Completed)

## 2019-11-12 NOTE — Patient Instructions (Addendum)
Get your eye exam please and have the practice send [place] your note   Also follow up with urology   Your colonoscopy will be due in the fall   If you are interested in the shingles vaccine series (Shingrix), call your insurance or pharmacy to check on coverage and location it must be given.  If affordable - you can schedule it here or at your pharmacy depending on coverage    Labs today   I would like to get you on a statin medicine for cholesterol with diabetes - let's see how your numbers look first

## 2019-11-13 LAB — COMPREHENSIVE METABOLIC PANEL
AG Ratio: 1.5 (calc) (ref 1.0–2.5)
ALT: 35 U/L (ref 9–46)
AST: 25 U/L (ref 10–35)
Albumin: 4.8 g/dL (ref 3.6–5.1)
Alkaline phosphatase (APISO): 44 U/L (ref 35–144)
BUN: 15 mg/dL (ref 7–25)
CO2: 30 mmol/L (ref 20–32)
Calcium: 9.9 mg/dL (ref 8.6–10.3)
Chloride: 102 mmol/L (ref 98–110)
Creat: 1.05 mg/dL (ref 0.70–1.25)
Globulin: 3.2 g/dL (calc) (ref 1.9–3.7)
Glucose, Bld: 100 mg/dL — ABNORMAL HIGH (ref 65–99)
Potassium: 3.9 mmol/L (ref 3.5–5.3)
Sodium: 142 mmol/L (ref 135–146)
Total Bilirubin: 0.8 mg/dL (ref 0.2–1.2)
Total Protein: 8 g/dL (ref 6.1–8.1)

## 2019-11-13 LAB — CBC WITH DIFFERENTIAL/PLATELET
Absolute Monocytes: 897 cells/uL (ref 200–950)
Basophils Absolute: 68 cells/uL (ref 0–200)
Basophils Relative: 0.9 %
Eosinophils Absolute: 524 cells/uL — ABNORMAL HIGH (ref 15–500)
Eosinophils Relative: 6.9 %
HCT: 42.9 % (ref 38.5–50.0)
Hemoglobin: 14.8 g/dL (ref 13.2–17.1)
Lymphs Abs: 2888 cells/uL (ref 850–3900)
MCH: 30.8 pg (ref 27.0–33.0)
MCHC: 34.5 g/dL (ref 32.0–36.0)
MCV: 89.4 fL (ref 80.0–100.0)
MPV: 9.5 fL (ref 7.5–12.5)
Monocytes Relative: 11.8 %
Neutro Abs: 3222 cells/uL (ref 1500–7800)
Neutrophils Relative %: 42.4 %
Platelets: 218 10*3/uL (ref 140–400)
RBC: 4.8 10*6/uL (ref 4.20–5.80)
RDW: 13.6 % (ref 11.0–15.0)
Total Lymphocyte: 38 %
WBC: 7.6 10*3/uL (ref 3.8–10.8)

## 2019-11-13 LAB — HEMOGLOBIN A1C
Hgb A1c MFr Bld: 6.7 % of total Hgb — ABNORMAL HIGH (ref <5.7)
Mean Plasma Glucose: 146 (calc)
eAG (mmol/L): 8.1 (calc)

## 2019-11-13 LAB — LIPID PANEL
Cholesterol: 211 mg/dL — ABNORMAL HIGH (ref <200)
HDL: 41 mg/dL (ref >=40)
LDL Cholesterol (Calc): 129 mg/dL (calc) — ABNORMAL HIGH
Non-HDL Cholesterol (Calc): 170 mg/dL (calc) — ABNORMAL HIGH (ref <130)
Total CHOL/HDL Ratio: 5.1 (calc) — ABNORMAL HIGH (ref <5.0)
Triglycerides: 264 mg/dL — ABNORMAL HIGH (ref <150)

## 2019-11-13 LAB — MICROALBUMIN / CREATININE URINE RATIO
Creatinine, Urine: 96 mg/dL (ref 20–320)
Microalb Creat Ratio: 127 mcg/mg creat — ABNORMAL HIGH (ref <30)
Microalb, Ur: 12.2 mg/dL

## 2019-11-13 LAB — PSA: PSA: 2.3 ng/mL (ref <=4.0)

## 2019-11-14 NOTE — Assessment & Plan Note (Signed)
bp in fair control at this time  BP Readings from Last 1 Encounters:  11/12/19 134/80   No changes needed Most recent labs reviewed  Disc lifstyle change with low sodium diet and exercise  Labs today

## 2019-11-14 NOTE — Assessment & Plan Note (Signed)
PSA today  Due for urology f/u for chronic prostatitis  Brother was dx with prostate cancer- also needs screening

## 2019-11-14 NOTE — Assessment & Plan Note (Signed)
Reviewed health habits including diet and exercise and skin cancer prevention Reviewed appropriate screening tests for age  Also reviewed health mt list, fam hx and immunization status , as well as social and family history   See HPI Labs ordered  Strongly enc covid vaccine -pt declines for now  Discussed shingrix vaccine  Pt plans to schedule eye exam and urology f/u Colonoscopy will be due in oct-he is aware  PSA added to labs today  Enc strongly to eat better and start exercise if able

## 2019-11-14 NOTE — Assessment & Plan Note (Signed)
Due for urology f/u-pt wants to schedule this himself

## 2019-11-14 NOTE — Assessment & Plan Note (Signed)
Labs today- would like to add statin  No symptoms  Goal to control BP and DM

## 2019-11-14 NOTE — Assessment & Plan Note (Signed)
Disc goals for lipids and reasons to control them Rev last labs with pt Rev low sat fat diet in detail Lab today Needs to start statin in light of DM - he has been resistant

## 2019-11-14 NOTE — Assessment & Plan Note (Signed)
A1C drawn today and microalb ordered Pt does not check glucose Has not benn motivated to change diet- encouraged this strongly  lexx exercise after ankle fx-he can start some walking now  Disc importance of statin-pt is unsure if he would take one

## 2019-11-15 ENCOUNTER — Telehealth: Payer: Self-pay | Admitting: Family Medicine

## 2019-11-15 DIAGNOSIS — S89209A Unspecified physeal fracture of upper end of unspecified fibula, initial encounter for closed fracture: Secondary | ICD-10-CM | POA: Diagnosis not present

## 2019-11-15 DIAGNOSIS — M25472 Effusion, left ankle: Secondary | ICD-10-CM | POA: Diagnosis not present

## 2019-11-15 MED ORDER — ROSUVASTATIN CALCIUM 5 MG PO TABS: 5.0000 mg | ORAL_TABLET | ORAL | 3 refills | Status: DC

## 2019-11-15 NOTE — Telephone Encounter (Signed)
-----   Message from Jerry [name], [place] sent at 11/15/2019  4:43 PM EST ----- Pt notified of lab results and Dr. Marliss Coots comments. Pt said he is willing to try the Crestor a few times a week, [facility] [address]  6 month f/u appt with a lab appt prior scheduled

## 2020-01-20 ENCOUNTER — Other Ambulatory Visit: Payer: Self-pay | Admitting: Family Medicine

## 2020-02-08 DIAGNOSIS — N401 Enlarged prostate with lower urinary tract symptoms: Secondary | ICD-10-CM | POA: Diagnosis not present

## 2020-02-08 DIAGNOSIS — R3912 Poor urinary stream: Secondary | ICD-10-CM | POA: Diagnosis not present

## 2020-05-18 ENCOUNTER — Telehealth: Payer: Self-pay | Admitting: Family Medicine

## 2020-05-18 DIAGNOSIS — E119 Type 2 diabetes mellitus without complications: Secondary | ICD-10-CM

## 2020-05-18 DIAGNOSIS — I10 Essential (primary) hypertension: Secondary | ICD-10-CM

## 2020-05-18 DIAGNOSIS — E785 Hyperlipidemia, unspecified: Secondary | ICD-10-CM

## 2020-05-18 NOTE — Telephone Encounter (Signed)
-----   Message from Cloyd Stagers, RT sent at 05/01/2020 11:13 AM EDT ----- Regarding: Lab Orders for Friday 9.3.2021 Please place lab orders for Friday 9.3.2021, office visit for 6 month f/u on Thursday 9.9.2021 Thank you, Dyke Maes RT(R)

## 2020-05-19 ENCOUNTER — Other Ambulatory Visit: Payer: 59

## 2020-05-23 ENCOUNTER — Other Ambulatory Visit: Payer: 59

## 2020-05-23 ENCOUNTER — Other Ambulatory Visit (INDEPENDENT_AMBULATORY_CARE_PROVIDER_SITE_OTHER): Payer: 59

## 2020-05-23 DIAGNOSIS — E1169 Type 2 diabetes mellitus with other specified complication: Secondary | ICD-10-CM

## 2020-05-23 DIAGNOSIS — I10 Essential (primary) hypertension: Secondary | ICD-10-CM | POA: Diagnosis not present

## 2020-05-23 DIAGNOSIS — E785 Hyperlipidemia, unspecified: Secondary | ICD-10-CM | POA: Diagnosis not present

## 2020-05-23 DIAGNOSIS — E119 Type 2 diabetes mellitus without complications: Secondary | ICD-10-CM | POA: Diagnosis not present

## 2020-05-23 LAB — COMPREHENSIVE METABOLIC PANEL
ALT: 30 U/L (ref 0–53)
AST: 20 U/L (ref 0–37)
Albumin: 4.4 g/dL (ref 3.5–5.2)
Alkaline Phosphatase: 29 U/L — ABNORMAL LOW (ref 39–117)
BUN: 14 mg/dL (ref 6–23)
CO2: 28 mEq/L (ref 19–32)
Calcium: 8.8 mg/dL (ref 8.4–10.5)
Chloride: 105 mEq/L (ref 96–112)
Creatinine, Ser: 1.06 mg/dL (ref 0.40–1.50)
GFR: 70.16 mL/min (ref >60.00)
Glucose, Bld: 117 mg/dL — ABNORMAL HIGH (ref 70–99)
Potassium: 3.5 mEq/L (ref 3.5–5.1)
Sodium: 141 mEq/L (ref 135–145)
Total Bilirubin: 1 mg/dL (ref 0.2–1.2)
Total Protein: 7 g/dL (ref 6.0–8.3)

## 2020-05-23 LAB — HEMOGLOBIN A1C: Hgb A1c MFr Bld: 6.9 % — ABNORMAL HIGH (ref 4.6–6.5)

## 2020-05-23 LAB — LIPID PANEL
Cholesterol: 186 mg/dL (ref 0–200)
HDL: 37.8 mg/dL — ABNORMAL LOW (ref >39.00)
LDL Cholesterol: 120 mg/dL — ABNORMAL HIGH (ref 0–99)
NonHDL: 148.31
Total CHOL/HDL Ratio: 5
Triglycerides: 141 mg/dL (ref 0.0–149.0)
VLDL: 28.2 mg/dL (ref 0.0–40.0)

## 2020-05-25 ENCOUNTER — Ambulatory Visit: Payer: 59 | Admitting: Family Medicine

## 2020-05-31 ENCOUNTER — Ambulatory Visit: Payer: 59 | Admitting: Family Medicine

## 2020-05-31 DIAGNOSIS — Z0289 Encounter for other administrative examinations: Secondary | ICD-10-CM

## 2020-06-06 DIAGNOSIS — Z03818 Encounter for observation for suspected exposure to other biological agents ruled out: Secondary | ICD-10-CM | POA: Diagnosis not present

## 2020-06-06 DIAGNOSIS — U071 COVID-19: Secondary | ICD-10-CM | POA: Diagnosis not present

## 2020-06-06 DIAGNOSIS — J029 Acute pharyngitis, unspecified: Secondary | ICD-10-CM | POA: Diagnosis not present

## 2020-06-06 DIAGNOSIS — Z7189 Other specified counseling: Secondary | ICD-10-CM | POA: Diagnosis not present

## 2020-06-08 DIAGNOSIS — U071 COVID-19: Secondary | ICD-10-CM | POA: Diagnosis not present

## 2020-06-12 DIAGNOSIS — J209 Acute bronchitis, unspecified: Secondary | ICD-10-CM | POA: Diagnosis not present

## 2020-06-12 DIAGNOSIS — J208 Acute bronchitis due to other specified organisms: Secondary | ICD-10-CM | POA: Diagnosis not present

## 2020-06-12 DIAGNOSIS — R0902 Hypoxemia: Secondary | ICD-10-CM | POA: Diagnosis not present

## 2020-06-12 DIAGNOSIS — J9621 Acute and chronic respiratory failure with hypoxia: Secondary | ICD-10-CM | POA: Diagnosis not present

## 2020-06-12 DIAGNOSIS — J9690 Respiratory failure, unspecified, unspecified whether with hypoxia or hypercapnia: Secondary | ICD-10-CM | POA: Diagnosis not present

## 2020-06-12 DIAGNOSIS — U071 COVID-19: Secondary | ICD-10-CM | POA: Diagnosis not present

## 2020-06-12 DIAGNOSIS — J189 Pneumonia, unspecified organism: Secondary | ICD-10-CM | POA: Diagnosis not present

## 2020-06-16 DIAGNOSIS — U071 COVID-19: Secondary | ICD-10-CM | POA: Diagnosis not present

## 2020-06-23 ENCOUNTER — Telehealth: Payer: Self-pay

## 2020-06-23 NOTE — Telephone Encounter (Signed)
Pt just called and said for 2 days BP has been very high; pt said after takes amlodipine 10 mg between 9 - 11 AM BP will go down but within 1 - 2 hrs BP goes high again. Pt does not remember the # for BP after taking amlodipine. Pt said just took BP and now BP 254/153 P 91. Pt said he thinks the cuff is accurate because his wife takes her BP with same cuff. Pt has no H/A dizziness,CP, SOB or vision changes. Pt said he has no symptoms and yesterday he decided to ck BP and it was elevated like today. Last annual 11/12/19.  Pt does not want to go to ED and pts wife will drive pt to [facility] on [address]; pt will take his BP cuff for comparison with UC reading. FYI to DR [employer].

## 2020-06-23 NOTE — Telephone Encounter (Signed)
That is reassuring.  Let us know if no further improvement over the weekend. Avoid excess sodium and caffeine

## 2020-06-23 NOTE — Telephone Encounter (Signed)
Pt notified of Dr. Marliss Coots comments and verbalized understanding. He will keep an eye on it over the weekend and keep [place] posted

## 2020-06-23 NOTE — Telephone Encounter (Signed)
Pt called back to say he was using the wrist cuff wrong. He checked his BP again and it was  156/99. He is not going to UC. He is going to monitor his BP . He is sure it will go down as he got excited he thought it was so high.

## 2020-07-19 ENCOUNTER — Other Ambulatory Visit: Payer: Self-pay | Admitting: Family Medicine

## 2020-07-31 ENCOUNTER — Telehealth: Payer: Self-pay | Admitting: Family Medicine

## 2020-07-31 NOTE — Telephone Encounter (Signed)
Please schedule fasting lipids with ast and alt when convenient in the next month Thanks

## 2020-07-31 NOTE — Telephone Encounter (Signed)
Pt called in he is getting over covid and wanted to know about getting an appointment to check his chololestorol due to the medication. And wanted to know about if he needs to do lab work.    Please advise

## 2020-08-01 NOTE — Telephone Encounter (Signed)
Patient scheduled appointment on 08/08/20.

## 2020-08-07 ENCOUNTER — Telehealth: Payer: Self-pay | Admitting: Family Medicine

## 2020-08-07 DIAGNOSIS — E785 Hyperlipidemia, unspecified: Secondary | ICD-10-CM

## 2020-08-07 DIAGNOSIS — E1169 Type 2 diabetes mellitus with other specified complication: Secondary | ICD-10-CM

## 2020-08-07 NOTE — Telephone Encounter (Signed)
-----   Message from Ellamae Sia sent at 08/01/2020 11:34 AM EST ----- Regarding: lab orders for Tuesday, 11.23.21 Lab orders, thanks

## 2020-08-08 ENCOUNTER — Other Ambulatory Visit: Payer: Self-pay

## 2020-08-08 ENCOUNTER — Other Ambulatory Visit (INDEPENDENT_AMBULATORY_CARE_PROVIDER_SITE_OTHER): Payer: 59

## 2020-08-08 DIAGNOSIS — E785 Hyperlipidemia, unspecified: Secondary | ICD-10-CM | POA: Diagnosis not present

## 2020-08-08 DIAGNOSIS — E1169 Type 2 diabetes mellitus with other specified complication: Secondary | ICD-10-CM

## 2020-08-08 LAB — LIPID PANEL
Cholesterol: 209 mg/dL — ABNORMAL HIGH (ref 0–200)
HDL: 39.6 mg/dL (ref >39.00)
NonHDL: 169.27
Total CHOL/HDL Ratio: 5
Triglycerides: 272 mg/dL — ABNORMAL HIGH (ref 0.0–149.0)
VLDL: 54.4 mg/dL — ABNORMAL HIGH (ref 0.0–40.0)

## 2020-08-08 LAB — AST: AST: 21 U/L (ref 0–37)

## 2020-08-08 LAB — ALT: ALT: 26 U/L (ref 0–53)

## 2020-08-08 LAB — LDL CHOLESTEROL, DIRECT: Direct LDL: 139 mg/dL

## 2020-10-24 ENCOUNTER — Encounter: Payer: Self-pay | Admitting: Internal Medicine

## 2020-11-08 ENCOUNTER — Telehealth: Payer: Self-pay | Admitting: Family Medicine

## 2020-11-08 DIAGNOSIS — E785 Hyperlipidemia, unspecified: Secondary | ICD-10-CM

## 2020-11-08 DIAGNOSIS — Z125 Encounter for screening for malignant neoplasm of prostate: Secondary | ICD-10-CM

## 2020-11-08 DIAGNOSIS — I10 Essential (primary) hypertension: Secondary | ICD-10-CM

## 2020-11-08 DIAGNOSIS — E1169 Type 2 diabetes mellitus with other specified complication: Secondary | ICD-10-CM

## 2020-11-08 DIAGNOSIS — E119 Type 2 diabetes mellitus without complications: Secondary | ICD-10-CM

## 2020-11-08 NOTE — Telephone Encounter (Signed)
-----   Message from Cloyd Stagers, RT sent at 10/23/2020  2:00 PM EST ----- Regarding: Lab Orders for Thursday 2.24.2022 Please place lab orders for Thursday 2.24.2022, office visit for physical on Monday 2.28.2022 Thank you, Dyke Maes RT(R)

## 2020-11-09 ENCOUNTER — Other Ambulatory Visit: Payer: 59

## 2020-11-13 ENCOUNTER — Encounter: Payer: 59 | Admitting: Family Medicine

## 2020-11-14 ENCOUNTER — Other Ambulatory Visit: Payer: Self-pay | Admitting: Family Medicine

## 2020-11-22 ENCOUNTER — Telehealth: Payer: Self-pay | Admitting: Family Medicine

## 2020-11-23 ENCOUNTER — Telehealth (INDEPENDENT_AMBULATORY_CARE_PROVIDER_SITE_OTHER): Payer: Medicare HMO | Admitting: Family Medicine

## 2020-11-23 ENCOUNTER — Telehealth: Payer: Self-pay | Admitting: Family Medicine

## 2020-11-23 ENCOUNTER — Telehealth: Payer: Self-pay

## 2020-11-23 DIAGNOSIS — R0982 Postnasal drip: Secondary | ICD-10-CM

## 2020-11-23 DIAGNOSIS — R0981 Nasal congestion: Secondary | ICD-10-CM | POA: Diagnosis not present

## 2020-11-23 MED ORDER — MOMETASONE FUROATE 50 MCG/ACT NA SUSP: NASAL | 0 refills | Status: DC

## 2020-11-23 NOTE — Progress Notes (Signed)
Virtual Visit via Video Note  I connected with Jerry [name]  on 11/23/20 at 10:40 AM EST by a video enabled telemedicine application and verified that I am speaking with the correct person using two identifiers.  Location patient: home, [place] Location provider:work or home office Persons participating in the virtual visit: patient, provider  I discussed the limitations of evaluation and management by telemedicine and the availability of in person appointments. The patient expressed understanding and agreed to proceed.   HPI:  Acute telemedicine visit for PND, nasal congestion: -Onset: with change in weather - reports always gets issues in the winter with allergies -Symptoms include: chronic nasal congestion, stuffy nose (CPAP helps this usually), ears get stopped up at time, drainage in the throat, itchy eyes sometimes, some sneezing - but not much -Denies: sinus pain, fever, CP, SOB, NVD, hx of severe nosebleeds -Has tried: clarintin 10mg  daily, uses nasal saline -Pertinent past medical history: Allergic rhinitis, hx of acid reflux as well -Pertinent medication allergies: augmentin, had nosebleed with flonase at one point - but not an allergy  ROS: See pertinent positives and negatives per HPI.  Past Medical History:  Diagnosis Date  . Allergic rhinitis, cause unspecified   . Asthma   . Diabetes mellitus without complication ([place])   . Elbow fracture age 30 or 74   left  . History of kidney stones   . Hx of colonic polyps   . Other and unspecified hyperlipidemia   . Unspecified essential hypertension     Past Surgical History:  Procedure Laterality Date  . 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 CHOLECYSTECTOMY    . KIDNEY STONE SURGERY    . left leg fracture       Current Outpatient Medications:  .  amLODipine (NORVASC) 10 MG tablet, TAKE 1 TABLET BY MOUTH EVERY DAY, Disp: 90 tablet, Rfl: 0 .  Ascorbic Acid (VITAMIN C) 100 MG tablet, Take 100 mg by mouth daily., Disp: ,  Rfl:  .  ELDERBERRY PO, Take 1 tablet by mouth daily., Disp: , Rfl:  .  loratadine (CLARITIN) 10 MG tablet, Take 10 mg by mouth daily. , Disp: , Rfl:  .  metFORMIN (GLUCOPHAGE-XR) 500 MG 24 hr tablet, TAKE 1 TABLET BY MOUTH EVERY DAY WITH BREAKFAST, Disp: 30 tablet, Rfl: 5 .  Multiple Vitamin (MULTIVITAMIN) capsule, Take 1 capsule by mouth daily.  , Disp: , Rfl:  .  Multiple Vitamins-Minerals (ZINC PO), Take 1 tablet by mouth daily., Disp: , Rfl:  .  OVER THE COUNTER MEDICATION, Take 1 tablet by mouth daily. Nugenix, Disp: , Rfl:  .  rosuvastatin (CRESTOR) 5 MG tablet, Take 1 tablet (5 mg total) by mouth 2 (two) times a week., Disp: 24 tablet, Rfl: 3  EXAM:  VITALS per patient if applicable:  GENERAL: alert, oriented, appears well and in no acute distress  HEENT: atraumatic, conjunttiva clear, no obvious abnormalities on inspection of external nose and ears  NECK: normal movements of the head and neck  LUNGS: on inspection no signs of respiratory distress, breathing rate appears normal, no obvious gross SOB, gasping or wheezing  CV: no obvious cyanosis  MS: moves all visible extremities without noticeable abnormality  PSYCH/NEURO: pleasant and cooperative, no obvious depression or anxiety, speech and thought processing grossly intact  ASSESSMENT AND PLAN:  Discussed the following assessment and plan:  Chronic nasal congestion  PND (post-nasal drip)  -we discussed possible serious and likely etiologies, options for evaluation and workup, limitations of  telemedicine visit vs in person visit, treatment, treatment risks and precautions. Pt prefers to treat via telemedicine empirically rather than in person at this moment.  Suspect allergic rhinitis or acid reflux of the most likely underlying etiology versus other.  Opted to try changing his allergy regimen to Allegra and after lengthy discussion of his history of nosebleeds with Flonase, opted to try Nasonex with avoidance of spraying  on the septum.  Demonstrated proper use.  Advised if any nosebleeds occur, how to manage and to stop the Nasonex.  Advised follow-up with primary care doctor in about 2 weeks.  Discussed possibly could try acid reflux medicine, additional allergy medication such as Singulair or seeing an allergist if the above measures are not working, after in-person evaluation. Scheduled follow up with PCP offered: He agrees to contact his primary care office for follow-up. Advised to seek prompt in person care if worsening, new symptoms arise, or if is not improving with treatment. Discussed options for inperson care if PCP office not available. Did let this patient know that I only do telemedicine on Tuesdays and Thursdays for [place]. Advised to schedule follow up visit with PCP or [facility] if any further questions or concerns to avoid delays in care.   I discussed the assessment and treatment plan with the patient. The patient was provided an opportunity to ask questions and all were answered. The patient agreed with the plan and demonstrated an understanding of the instructions.     Lucretia Kern, DO

## 2020-11-23 NOTE — Telephone Encounter (Signed)
Pt had video visit earlier today and was advised to take allegra and use Nasonex nasal spray. Nasonex is not OTC at this time. Pt request med sent to [facility].

## 2020-11-23 NOTE — Patient Instructions (Signed)
-  STOP the claritin and lets try allegra instead   -get nasonex nasal spray and use according to instructions and as we discussed make sure you are not spraying towards the septum (middle of your nose.) Use the opposite hand from nostril to spray.  -schedule a follow up with your Primary care doctor in about 2 weeks  -may need to consider trial of acid reflux medicine, additional allergy medication or seeing an allergist, if the above measures are not working.   I hope you are feeling better soon!  Seek in person care promptly sooner if your symptoms worsen, new concerns arise or you are not improving with treatment.  It was nice to meet you today. I help [facility] out with telemedicine visits on Tuesdays and Thursdays and am available for visits on those days. If you have any concerns or questions following this visit please schedule a follow up visit with your Primary Care doctor or seek care at a local urgent care clinic to avoid delays in care.

## 2020-12-11 ENCOUNTER — Other Ambulatory Visit: Payer: Self-pay

## 2020-12-11 ENCOUNTER — Other Ambulatory Visit (INDEPENDENT_AMBULATORY_CARE_PROVIDER_SITE_OTHER): Payer: Medicare HMO

## 2020-12-11 DIAGNOSIS — E785 Hyperlipidemia, unspecified: Secondary | ICD-10-CM | POA: Diagnosis not present

## 2020-12-11 DIAGNOSIS — Z125 Encounter for screening for malignant neoplasm of prostate: Secondary | ICD-10-CM | POA: Diagnosis not present

## 2020-12-11 DIAGNOSIS — E1169 Type 2 diabetes mellitus with other specified complication: Secondary | ICD-10-CM | POA: Diagnosis not present

## 2020-12-11 DIAGNOSIS — E119 Type 2 diabetes mellitus without complications: Secondary | ICD-10-CM | POA: Diagnosis not present

## 2020-12-11 DIAGNOSIS — I10 Essential (primary) hypertension: Secondary | ICD-10-CM

## 2020-12-11 LAB — PSA, MEDICARE: PSA: 2.64 ng/ml (ref 0.10–4.00)

## 2020-12-11 LAB — MICROALBUMIN / CREATININE URINE RATIO
Creatinine,U: 61.2 mg/dL
Microalb Creat Ratio: 3.5 mg/g (ref 0.0–30.0)
Microalb, Ur: 2.1 mg/dL — ABNORMAL HIGH (ref 0.0–1.9)

## 2020-12-11 LAB — COMPREHENSIVE METABOLIC PANEL
ALT: 31 U/L (ref 0–53)
AST: 18 U/L (ref 0–37)
Albumin: 4.5 g/dL (ref 3.5–5.2)
Alkaline Phosphatase: 28 U/L — ABNORMAL LOW (ref 39–117)
BUN: 19 mg/dL (ref 6–23)
CO2: 32 mEq/L (ref 19–32)
Calcium: 10 mg/dL (ref 8.4–10.5)
Chloride: 101 mEq/L (ref 96–112)
Creatinine, Ser: 1.01 mg/dL (ref 0.40–1.50)
GFR: 78.12 mL/min (ref >60.00)
Glucose, Bld: 153 mg/dL — ABNORMAL HIGH (ref 70–99)
Potassium: 3.4 mEq/L — ABNORMAL LOW (ref 3.5–5.1)
Sodium: 142 mEq/L (ref 135–145)
Total Bilirubin: 0.7 mg/dL (ref 0.2–1.2)
Total Protein: 7.2 g/dL (ref 6.0–8.3)

## 2020-12-11 LAB — TSH: TSH: 5.08 u[IU]/mL — ABNORMAL HIGH (ref 0.35–4.50)

## 2020-12-11 LAB — CBC WITH DIFFERENTIAL/PLATELET
Basophils Absolute: 0.1 10*3/uL (ref 0.0–0.1)
Basophils Relative: 0.9 % (ref 0.0–3.0)
Eosinophils Absolute: 0.3 10*3/uL (ref 0.0–0.7)
Eosinophils Relative: 4.4 % (ref 0.0–5.0)
HCT: 40.9 % (ref 39.0–52.0)
Hemoglobin: 14.3 g/dL (ref 13.0–17.0)
Lymphocytes Relative: 37.3 % (ref 12.0–46.0)
Lymphs Abs: 2.6 10*3/uL (ref 0.7–4.0)
MCHC: 34.9 g/dL (ref 30.0–36.0)
MCV: 89.4 fl (ref 78.0–100.0)
Monocytes Absolute: 0.6 10*3/uL (ref 0.1–1.0)
Monocytes Relative: 8.7 % (ref 3.0–12.0)
Neutro Abs: 3.4 10*3/uL (ref 1.4–7.7)
Neutrophils Relative %: 48.7 % (ref 43.0–77.0)
Platelets: 201 10*3/uL (ref 150.0–400.0)
RBC: 4.57 Mil/uL (ref 4.22–5.81)
RDW: 14.5 % (ref 11.5–15.5)
WBC: 7 10*3/uL (ref 4.0–10.5)

## 2020-12-11 LAB — HEMOGLOBIN A1C: Hgb A1c MFr Bld: 6.9 % — ABNORMAL HIGH (ref 4.6–6.5)

## 2020-12-11 LAB — LIPID PANEL
Cholesterol: 210 mg/dL — ABNORMAL HIGH (ref 0–200)
HDL: 43.4 mg/dL (ref >39.00)
NonHDL: 166.9
Total CHOL/HDL Ratio: 5
Triglycerides: 211 mg/dL — ABNORMAL HIGH (ref 0.0–149.0)
VLDL: 42.2 mg/dL — ABNORMAL HIGH (ref 0.0–40.0)

## 2020-12-11 LAB — LDL CHOLESTEROL, DIRECT: Direct LDL: 132 mg/dL

## 2020-12-11 NOTE — Addendum Note (Signed)
Addended by: Ellamae Sia on: 12/11/2020 08:56 AM   Modules accepted: Orders

## 2020-12-12 ENCOUNTER — Other Ambulatory Visit: Payer: Self-pay | Admitting: Family Medicine

## 2020-12-18 ENCOUNTER — Encounter: Payer: Self-pay | Admitting: Family Medicine

## 2020-12-18 ENCOUNTER — Ambulatory Visit (INDEPENDENT_AMBULATORY_CARE_PROVIDER_SITE_OTHER): Payer: Medicare HMO | Admitting: Family Medicine

## 2020-12-18 ENCOUNTER — Other Ambulatory Visit: Payer: Self-pay

## 2020-12-18 VITALS — BP 132/80 | HR 56 | Temp 97.1°F | Ht 68.0 in | Wt 186.3 lb

## 2020-12-18 DIAGNOSIS — N411 Chronic prostatitis: Secondary | ICD-10-CM

## 2020-12-18 DIAGNOSIS — Z125 Encounter for screening for malignant neoplasm of prostate: Secondary | ICD-10-CM

## 2020-12-18 DIAGNOSIS — I700 Atherosclerosis of aorta: Secondary | ICD-10-CM | POA: Diagnosis not present

## 2020-12-18 DIAGNOSIS — E119 Type 2 diabetes mellitus without complications: Secondary | ICD-10-CM

## 2020-12-18 DIAGNOSIS — E785 Hyperlipidemia, unspecified: Secondary | ICD-10-CM

## 2020-12-18 DIAGNOSIS — G720 Drug-induced myopathy: Secondary | ICD-10-CM

## 2020-12-18 DIAGNOSIS — R7989 Other specified abnormal findings of blood chemistry: Secondary | ICD-10-CM | POA: Diagnosis not present

## 2020-12-18 DIAGNOSIS — I10 Essential (primary) hypertension: Secondary | ICD-10-CM

## 2020-12-18 DIAGNOSIS — E1169 Type 2 diabetes mellitus with other specified complication: Secondary | ICD-10-CM | POA: Diagnosis not present

## 2020-12-18 DIAGNOSIS — E876 Hypokalemia: Secondary | ICD-10-CM

## 2020-12-18 DIAGNOSIS — Z0000 Encounter for general adult medical examination without abnormal findings: Secondary | ICD-10-CM | POA: Diagnosis not present

## 2020-12-18 DIAGNOSIS — T466X5A Adverse effect of antihyperlipidemic and antiarteriosclerotic drugs, initial encounter: Secondary | ICD-10-CM

## 2020-12-18 MED ORDER — METFORMIN HCL ER 500 MG PO TB24: ORAL_TABLET | ORAL | 3 refills | Status: DC

## 2020-12-18 MED ORDER — AMLODIPINE BESYLATE 10 MG PO TABS: 1.0000 | ORAL_TABLET | Freq: Every day | ORAL | 3 refills | Status: DC

## 2020-12-18 MED ORDER — MOMETASONE FUROATE 50 MCG/ACT NA SUSP: NASAL | 3 refills | Status: DC

## 2020-12-18 NOTE — Assessment & Plan Note (Signed)
Disc goals for lipids and reasons to control them Rev last labs with pt Rev low sat fat diet in detail Sub optimal LDL control for diabetes but pt is intolerant to even low dose intermittent rosuvastatin

## 2020-12-18 NOTE — Patient Instructions (Addendum)
Don't forget to follow up with your urologist  Please work on an advance directive   Make sure you are getting some high potassium foods   We will re check thyroid with another tsh and free T4 next time   Follow up in 6 months  Get back to walking when allergies permit

## 2020-12-18 NOTE — Assessment & Plan Note (Signed)
Mild Lab Results  Component Value Date   TSH 5.08 (H) 12/11/2020   No clinical changes  Re check this in 6 months with FT4

## 2020-12-18 NOTE — Progress Notes (Signed)
Subjective:    Patient ID: Jerry [name], male    DOB: [birth year], 65 y.o.   MRN: [id number]  This visit occurred during the SARS-CoV-2 public health emergency.  Safety protocols were in place, including screening questions prior to the visit, additional usage of staff PPE, and extensive cleaning of exam room while observing appropriate contact time as indicated for disinfecting solutions.    HPI Pt presents for welcome to medicare visit   I have personally reviewed the Medicare Annual Wellness questionnaire and have noted 1. The patient's medical and social history 2. Their use of alcohol, tobacco or illicit drugs 3. Their current medications and supplements 4. The patient's functional ability including ADL's, fall risks, home safety risks and hearing or visual             impairment. 5. Diet and physical activities 6. Evidence for depression or mood disorders  The patients weight, height, BMI have been recorded in the chart and visual acuity is per eye clinic.  I have made referrals, counseling and provided education to the patient based review of the above and I have provided the pt with a written personalized care plan for preventive services. Reviewed and updated provider list, see scanned forms.  See scanned forms.  Routine anticipatory guidance given to patient.  See health maintenance. Colon cancer screening  Colonoscopy 10/16  Flu vaccine  2019 Tetanus vaccine Tdap 8/20  Pneumovax  Had pna 23 10/16 -declines more  Zoster vaccine -declines  covid status-has had covid 19, declines vaccination  Allergies have been worse since covid , and bronchial area was sore for a long time  Changed to nasonex and allegra for seasonal allergies recently and it has helped Nasal saline wash also  Falls-none Fractures-none  Supplements-vitamin D3 daily  Exercise - working outside and some walking on and off (taking a break for pollen) Uses cpap for sleep apnea   Prostate cancer  screening Lab Results  Component Value Date   PSA 2.64 12/11/2020   PSA 2.3 11/12/2019   PSA 3.36 07/04/2017   Has a h/o chronic prostatitis  Also family history of prostate cancer Has seen urology -due for a follow up   Advance directive-given materials to work on  Cognitive function addressed- see scanned forms- and if abnormal then additional documentation follows.  No memory changes  Takes care of the household   PMH and [place] reviewed  Meds, vitals, and allergies reviewed.   ROS: See HPI.  Otherwise negative.    Weight : Wt Readings from Last 3 Encounters:  12/18/20 186 lb 5 oz (84.5 kg)  11/12/19 189 lb 9 oz (86 kg)  08/22/19 190 lb (86.2 kg)   28.33 kg/m   Hearing/vision:  Hearing Screening   125Hz  250Hz  500Hz  1000Hz  2000Hz  3000Hz  4000Hz  6000Hz  8000Hz   Right ear:   40 40 40  40    Left ear:   40 40 40  0      Visual Acuity Screening   Right eye Left eye Both eyes  Without correction:     With correction: 20/30 20/15 20/20      Care team  Khoury Siemon- pcp [facility]- cardiology [facility]- ortho  HTN bp is stable today  No cp or palpitations or headaches or edema  No side effects to medicines  BP Readings from Last 3 Encounters:  12/18/20 132/80  11/12/19 134/80  08/23/19 (!) 144/83    Pulse Readings from Last 3 Encounters:  12/18/20 (!) 56  11/12/19 64  08/23/19 66    Takes amlodipine 10 mg daily  Lab Results  Component Value Date   CREATININE 1.01 12/11/2020   BUN 19 12/11/2020   NA 142 12/11/2020   K 3.4 (L) 12/11/2020   CL 101 12/11/2020   CO2 32 12/11/2020  K has been mildly low in the past    Lab Results  Component Value Date   ALT 31 12/11/2020   AST 18 12/11/2020   ALKPHOS 28 (L) 12/11/2020   BILITOT 0.7 12/11/2020     H/o aortic atherosclerosis  No symptoms  Incidental finding  DM2 Lab Results  Component Value Date   HGBA1C 6.9 (H) 12/11/2020   No highs or lows or changes in blood sugar that he knows of  Diet has been  fairly good Avoids sweets  Avoids white flour products    Lab Results  Component Value Date   MICROALBUR 2.1 (H) 12/11/2020   MICROALBUR 12.2 11/12/2019   Taking metformin xr 500 mg daily  Stopped intermittent statin due to severe body pain  Eye exam - January       Hyperlipidemia Lab Results  Component Value Date   CHOL 210 (H) 12/11/2020   CHOL 209 (H) 08/08/2020   CHOL 186 05/23/2020   Lab Results  Component Value Date   HDL 43.40 12/11/2020   HDL 39.60 08/08/2020   HDL 37.80 (L) 05/23/2020   Lab Results  Component Value Date   LDLCALC 120 (H) 05/23/2020   LDLCALC 129 (H) 11/12/2019   LDLCALC 133 (H) 01/20/2019   Lab Results  Component Value Date   TRIG 211.0 (H) 12/11/2020   TRIG 272.0 (H) 08/08/2020   TRIG 141.0 05/23/2020   Lab Results  Component Value Date   CHOLHDL 5 12/11/2020   CHOLHDL 5 08/08/2020   CHOLHDL 5 05/23/2020   Lab Results  Component Value Date   LDLDIRECT 132.0 12/11/2020   LDLDIRECT 139.0 08/08/2020   LDLDIRECT 129.0 07/04/2017   Stopped intermittent rosuvastatin for severe body pain   TSH was mildly elevated today Lab Results  Component Value Date   TSH 5.08 (H) 12/11/2020    Some low back pain  Wants to work on that   Patient Active Problem List   Diagnosis Date Noted  . Welcome to Medicare preventive visit 12/18/2020  . Elevated TSH 12/18/2020  . Hypokalemia 12/18/2020  . Statin myopathy 12/18/2020  . Prostatitis, chronic 02/06/2018  . Family history of prostate cancer 02/06/2018  . Type 2 diabetes mellitus without complication, with no history of insulin use ([place]) 01/07/2017  . Former smoker 01/07/2017  . Aortic atherosclerosis ([place]) 01/06/2017  . Elevated PSA 04/24/2016  . Routine general medical examination at a health care facility 08/05/2011  . Prostate cancer screening 08/05/2011  . TINNITUS 09/06/2009  . COLONIC POLYPS, HX OF 01/16/2009  . Obstructive sleep apnea 10/26/2008  . Hyperlipidemia associated  with type 2 diabetes mellitus ([place]) 03/11/2007  . Essential hypertension 03/11/2007  . Allergic rhinitis 03/11/2007  . ASTHMA 03/11/2007   Past Medical History:  Diagnosis Date  . Allergic rhinitis, cause unspecified   . Asthma   . Diabetes mellitus without complication ([place])   . Elbow fracture age 56 or 59   left  . History of kidney stones   . Hx of colonic polyps   . Other and unspecified hyperlipidemia   . Unspecified essential hypertension    Past Surgical History:  Procedure Laterality Date  . 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  CHOLECYSTECTOMY    . KIDNEY STONE SURGERY    . left leg fracture     Social History   Tobacco Use  . Smoking status: Former Smoker    Packs/day: 0.50    Years: 3.00    Pack years: 1.50    Types: Cigarettes    Quit date: 09/16/1972    Years since quitting: 48.2  . Smokeless tobacco: Never Used  Substance Use Topics  . Alcohol use: No    Alcohol/week: 0.0 standard drinks  . Drug use: No   Family History  Problem Relation Age of Onset  . Lung cancer Father   . Diabetes Mother   . Pancreatic cancer Paternal Grandfather   . Colon cancer Neg Hx    Allergies  Allergen Reactions  . Augmentin [Amoxicillin-Pot Clavulanate] Nausea And Vomiting  . Crestor [Rosuvastatin Calcium]     Body pain   . Fluticasone Propionate     REACTION: nosebleeds   Current Outpatient Medications on File Prior to Visit  Medication Sig Dispense Refill  . Ascorbic Acid (VITAMIN C) 100 MG tablet Take 100 mg by mouth daily.    Marland Kitchen ELDERBERRY PO Take 1 tablet by mouth daily.    . fexofenadine (ALLEGRA) 180 MG tablet Take 180 mg by mouth daily.    . Multiple Vitamin (MULTIVITAMIN) capsule Take 1 capsule by mouth daily.    . Multiple Vitamins-Minerals (ZINC PO) Take 1 tablet by mouth daily.    Marland Kitchen OVER THE COUNTER MEDICATION Take 1 tablet by mouth daily. Nugenix     No current facility-administered medications on file prior to visit.    Review of Systems   Constitutional: Negative for activity change, appetite change, fatigue, fever and unexpected weight change.  HENT: Negative for congestion, rhinorrhea, sore throat and trouble swallowing.   Eyes: Negative for pain, redness, itching and visual disturbance.  Respiratory: Negative for cough, chest tightness, shortness of breath and wheezing.   Cardiovascular: Negative for chest pain and palpitations.  Gastrointestinal: Negative for abdominal pain, blood in stool, constipation, diarrhea and nausea.  Endocrine: Negative for cold intolerance, heat intolerance, polydipsia and polyuria.  Genitourinary: Negative for difficulty urinating, dysuria, frequency and urgency.  Musculoskeletal: Positive for arthralgias. Negative for joint swelling and myalgias.  Skin: Negative for pallor and rash.  Neurological: Negative for dizziness, tremors, weakness, numbness and headaches.  Hematological: Negative for adenopathy. Does not bruise/bleed easily.  Psychiatric/Behavioral: Negative for decreased concentration and dysphoric mood. The patient is not nervous/anxious.        Objective:   Physical Exam Constitutional:      General: He is not in acute distress.    Appearance: Normal appearance. He is well-developed and normal weight. He is not ill-appearing or diaphoretic.  HENT:     Head: Normocephalic and atraumatic.     Right Ear: Tympanic membrane, ear canal and external ear normal.     Left Ear: Tympanic membrane, ear canal and external ear normal.     Nose: Nose normal. No congestion.     Mouth/Throat:     Mouth: Mucous membranes are moist.     Pharynx: Oropharynx is clear. No posterior oropharyngeal erythema.  Eyes:     General: No scleral icterus.       Right eye: No discharge.        Left eye: No discharge.     Conjunctiva/sclera: Conjunctivae normal.     Pupils: Pupils are equal, round, and reactive to light.  Neck:     Thyroid: No  thyromegaly.     Vascular: No carotid bruit or JVD.   Cardiovascular:     Rate and Rhythm: Normal rate and regular rhythm.     Pulses: Normal pulses.     Heart sounds: Normal heart sounds. No gallop.   Pulmonary:     Effort: Pulmonary effort is normal. No respiratory distress.     Breath sounds: Normal breath sounds. No wheezing or rales.     Comments: Good air exch Chest:     Chest wall: No tenderness.  Abdominal:     General: Bowel sounds are normal. There is no distension or abdominal bruit.     Palpations: Abdomen is soft. There is no mass.     Tenderness: There is no abdominal tenderness.     Hernia: No hernia is present.  Musculoskeletal:        General: No tenderness.     Cervical back: Normal range of motion and neck supple. No rigidity. No muscular tenderness.     Right lower leg: No edema.     Left lower leg: No edema.     Comments: No kyphosis   Lymphadenopathy:     Cervical: No cervical adenopathy.  Skin:    General: Skin is warm and dry.     Coloration: Skin is not pale.     Findings: No erythema or rash.     Comments: Solar lentigines diffusely   Neurological:     Mental Status: He is alert.     Cranial Nerves: No cranial nerve deficit.     Motor: No abnormal muscle tone.     Coordination: Coordination normal.     Gait: Gait normal.     Deep Tendon Reflexes: Reflexes are normal and symmetric. Reflexes normal.  Psychiatric:        Mood and Affect: Mood normal.        Cognition and Memory: Cognition and memory normal.           Assessment & Plan:

## 2020-12-18 NOTE — Assessment & Plan Note (Signed)
With family hx in brother  Lab Results  Component Value Date   PSA 2.64 12/11/2020   PSA 2.3 11/12/2019   PSA 3.36 07/04/2017    No change in voiding Will schedule his own urology f/u

## 2020-12-18 NOTE — Assessment & Plan Note (Signed)
Intolerant of even low dose intermittent statin

## 2020-12-18 NOTE — Assessment & Plan Note (Signed)
Reviewed health habits including diet and exercise and skin cancer prevention Reviewed appropriate screening tests for age  Also reviewed health mt list, fam hx and immunization status , as well as social and family history   See HPI Labs reviewed  utd colon cancer screening  Reviewed psa, pt will schedule urology f/u Declines pna 23 vaccine, zoster vaccine, covid vaccine and flu vaccine  No falls or fractures  Given materials to work on advance directive No cognitive concerns/ cares for his wife Hearing and vision screen reviewed (no complaints)

## 2020-12-18 NOTE — Assessment & Plan Note (Signed)
Lab Results  Component Value Date   HGBA1C 6.9 (H) 12/11/2020   Overall stable Fair diet -getting better  Consider ace  Intol of statin  Plan to continue metformin xr 500 mg daily  Eye exam utd from january

## 2020-12-18 NOTE — Assessment & Plan Note (Signed)
Mild  Unsure cause  Pt works to get more K in diet  Will continue to follow

## 2020-12-18 NOTE — Assessment & Plan Note (Signed)
Due for urology f/u, he will schedule  Lab Results  Component Value Date   PSA 2.64 12/11/2020   PSA 2.3 11/12/2019   PSA 3.36 07/04/2017

## 2020-12-18 NOTE — Assessment & Plan Note (Signed)
Intolerant of statins - watching cholesterol bp is controlled  No symptoms

## 2020-12-18 NOTE — Assessment & Plan Note (Signed)
bp in fair control at this time  BP Readings from Last 1 Encounters:  12/18/20 132/80   No changes needed Most recent labs reviewed  Disc lifstyle change with low sodium diet and exercise  Amlodipine 10mg  daily

## 2021-02-01 ENCOUNTER — Telehealth: Payer: Self-pay

## 2021-02-01 MED ORDER — BUDESONIDE 32 MCG/ACT NA SUSP: 2.0000 | Freq: Every day | NASAL | 11 refills | Status: DC

## 2021-02-01 NOTE — Telephone Encounter (Signed)
I will send generic rhinocort aqua to his pharmacy  This is also a nasal steroid but a little different formula so may not cause as much nosebleed as nasonex or flonase in the past  Worth a try  If this is not covered by his insurance he can buy otc (I use it and last bought through [facility] because hard to find in stores)  For congestion -nasal saline spray is always fine to use as well

## 2021-02-01 NOTE — Telephone Encounter (Signed)
Pt left v/m that Nasonex is not covered by insurance and pt said Nasonex causes nose bleeds also so pt is requesting different med sent to [facility].

## 2021-02-02 NOTE — Telephone Encounter (Signed)
Pt notified of Dr. Marliss Coots comments. Pt said he did pick up med yesterday and has used it twice without any issues. Pt said that they used a savings card so he only paid $11 for it. He will keep [place] posted

## 2021-02-06 ENCOUNTER — Telehealth: Payer: Self-pay

## 2021-02-06 NOTE — Telephone Encounter (Signed)
New message    Patient returning call from the nurse

## 2021-02-06 NOTE — Telephone Encounter (Signed)
Not sure who called pt but I didn't call him

## 2021-05-17 ENCOUNTER — Other Ambulatory Visit: Payer: Self-pay | Admitting: Family Medicine

## 2021-05-22 ENCOUNTER — Telehealth: Payer: Self-pay

## 2021-05-22 ENCOUNTER — Emergency Department
Admission: EM | Admit: 2021-05-22 | Discharge: 2021-05-22 | Disposition: A | Discharge status: Home / Self Care | Payer: Medicare HMO | Attending: Emergency Medicine | Admitting: Emergency Medicine

## 2021-05-22 ENCOUNTER — Other Ambulatory Visit: Payer: Self-pay

## 2021-05-22 ENCOUNTER — Emergency Department: Payer: Medicare HMO

## 2021-05-22 DIAGNOSIS — Z7984 Long term (current) use of oral hypoglycemic drugs: Secondary | ICD-10-CM | POA: Diagnosis not present

## 2021-05-22 DIAGNOSIS — M79605 Pain in left leg: Secondary | ICD-10-CM | POA: Diagnosis not present

## 2021-05-22 DIAGNOSIS — Z79899 Other long term (current) drug therapy: Secondary | ICD-10-CM | POA: Insufficient documentation

## 2021-05-22 DIAGNOSIS — E119 Type 2 diabetes mellitus without complications: Secondary | ICD-10-CM | POA: Insufficient documentation

## 2021-05-22 DIAGNOSIS — M79662 Pain in left lower leg: Secondary | ICD-10-CM | POA: Diagnosis not present

## 2021-05-22 DIAGNOSIS — Z87891 Personal history of nicotine dependence: Secondary | ICD-10-CM | POA: Diagnosis not present

## 2021-05-22 DIAGNOSIS — J45909 Unspecified asthma, uncomplicated: Secondary | ICD-10-CM | POA: Diagnosis not present

## 2021-05-22 DIAGNOSIS — I10 Essential (primary) hypertension: Secondary | ICD-10-CM | POA: Diagnosis not present

## 2021-05-22 DIAGNOSIS — M7989 Other specified soft tissue disorders: Secondary | ICD-10-CM | POA: Diagnosis not present

## 2021-05-22 NOTE — Discharge Instructions (Addendum)
Your ultrasound was negative for any you can take ibuprofen for pain.  Also try to elevate the leg while you are sitting.  If you continue to have symptoms please follow-up with your primary care physician.

## 2021-05-22 NOTE — Telephone Encounter (Signed)
Jerry [name] - Client TELEPHONE ADVICE RECORD [facility] Patient Name: Jerry [name] Gender: Male DOB: 1956/02/20 Age: 65 Y 9 M 25 D Return Phone Number: [phone] (Primary), [phone] (Secondary) Address: [name] [address] Client Jerry [name] - Client Client Site [place] MD Contact Type Call Who Is Calling Patient / Member / Family / Caregiver Call Type Triage / Clinical Relationship To Patient Self Return Phone Number [phone] (Primary) Chief Complaint Leg Swelling And Edema Reason for Call Symptomatic / Request for [place] is concerned they have a blood clot in their leg. Caller reports yesterday evening he had a sensation of his heart throbbing in his lower left leg. Caller's left calf has swollen. Additional Comment Caller reports and ache as well. Translation No Nurse Assessment Nurse: Jerry Ruths, RN, [facility] Date/Time (Eastern Time): 05/22/2021 8:38:47 AM Confirm and document reason for call. If symptomatic, describe symptoms. ---Caller states that SUnday afternoon that he had a sensation of his heart throbbing in his left calf. states no more issues until last night states the calf is swollen now took aspirin and within an hour the sensation stopped but it has swollen more and there is an ache to the leg states the back of the left calf is swollen Does the patient have any new or worsening symptoms? ---Yes Will a triage be completed? ---Yes Related visit to physician within the last 2 weeks? ---No Does the PT have any chronic conditions? (i.e. diabetes, asthma, this includes High risk factors for pregnancy, etc.) ---Yes List chronic conditions. ---htn, dm, Is this a behavioral health or substance abuse call? ---No Guidelines Guideline Title Affirmed Question Affirmed Notes  Nurse Date/Time Eilene Ghazi Time) Leg Swelling and Edema [1] Thigh or calf pain AND [2] only 1 side AND [3] present > 1 hour Jerry Ruths, RN, [facility] 05/22/2021 8:42:41 AM PLEASE NOTE: All timestamps contained within this report are represented as [place] Standard Time. CONFIDENTIALTY NOTICE: This fax transmission is intended only for the addressee. It contains information that is legally privileged, confidential or otherwise protected from use or disclosure. If you are not the intended recipient, you are strictly prohibited from reviewing, disclosing, copying using or disseminating any of this information or taking any action in reliance on or regarding this information. If you have received this fax in error, please notify us immediately by telephone so that we can arrange for its return to [place]. Phone: [phone], Toll-Free: [phone], Fax: [phone] Page: 2 of 2 Call Id: [id number] Jerry [name]. Time Eilene Ghazi Time) Disposition Final User 05/22/2021 8:47:58 AM See HCP within 4 Hours (or PCP triage) Yes Jerry Ruths, RN, [facility] Caller Disagree/Comply Comply Caller Understands Yes PreDisposition Call Doctor Care Advice Given Per Guideline * UCC: Some UCCs can manage patients who are stable and have less serious symptoms (e.g., minor illnesses and injuries). The triager must know the [facility] capabilities before sending a patient there. If unsure, call ahead. CALL EMS IF: * Chest pain or shortness of breath occurs. CARE ADVICE given per Leg Swelling and Edema (Adult) guideline. SEE HCP (OR PCP TRIAGE) WITHIN 4 HOURS: * IF OFFICE WILL BE OPEN: You need to be seen within the next 3 or 4 hours. Call your doctor (or NP/PA) now or as soon as the office opens. Comments User: Tawni Levy, RN Date/Time Eilene Ghazi Time): 05/22/2021 8:49:02 AM Backline operator states no appt today and to refer client to [facility]  or ED Client informed and going to closest UC which he is unsure of the name of Referrals GO TO FACILITY  UNDECIDED REFERRED TO PCP OFFICE Warm transfer to backline

## 2021-05-22 NOTE — ED Provider Notes (Signed)
[facility]  ____________________________________________   Event Date/Time   First MD Initiated Contact with Patient 05/22/21 1140     (approximate)  I have reviewed the triage vital signs and the nursing notes.   HISTORY  Chief Complaint Leg Pain    HPI Jerry [name] is a 65 y.o. male past medical history of asthma, diabetes who presents with left leg pain.  Symptoms started 2 days ago.  He denies any preceding injury.  Pain feels like a muscle cramp.  Has not noticed any swelling or redness.  He denies any history of prior DVT but that is what is concerned about today.  Denies shortness of breath or chest pain The patient denies hx of prior DVT/PE, unilateral leg pain/swelling, hormone use, recent surgery, hx of cancer, prolonged immobilization, or hemoptysis.           Past Medical History:  Diagnosis Date   Allergic rhinitis, cause unspecified    Asthma    Diabetes mellitus without complication ([place])    Elbow fracture age 59 or 68   left   History of kidney stones    Hx of colonic polyps    Other and unspecified hyperlipidemia    Unspecified essential hypertension     Patient Active Problem List   Diagnosis Date Noted   Welcome to Medicare preventive visit 12/18/2020   Elevated TSH 12/18/2020   Hypokalemia 12/18/2020   Statin myopathy 12/18/2020   Prostatitis, chronic 02/06/2018   Family history of prostate cancer 02/06/2018   Type 2 diabetes mellitus without complication, with no history of insulin use ([place]) 01/07/2017   Former smoker 01/07/2017   Aortic atherosclerosis ([place]) 01/06/2017   Elevated PSA 04/24/2016   Routine general medical examination at a health care facility 08/05/2011   Prostate cancer screening 08/05/2011   TINNITUS 09/06/2009   COLONIC POLYPS, HX OF 01/16/2009   Obstructive sleep apnea 10/26/2008   Hyperlipidemia associated with type 2 diabetes mellitus ([place]) 03/11/2007   Essential hypertension 03/11/2007    Allergic rhinitis 03/11/2007   ASTHMA 03/11/2007    Past Surgical History:  Procedure Laterality Date   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 Authorizing Provider  amLODipine (NORVASC) 10 MG tablet Take 1 tablet (10 mg total) by mouth daily. 12/18/20   Tower, Wynelle Fanny, MD  Ascorbic Acid (VITAMIN C) 100 MG tablet Take 100 mg by mouth daily.    [provider]  budesonide (RHINOCORT AQUA) 32 MCG/ACT nasal spray Place 2 sprays into both nostrils daily. 02/01/21   Tower, Wynelle Fanny, MD  ELDERBERRY PO Take 1 tablet by mouth daily.    [provider]  fexofenadine (ALLEGRA) 180 MG tablet Take 180 mg by mouth daily.    [provider]  metFORMIN (GLUCOPHAGE-XR) 500 MG 24 hr tablet TAKE 1 TABLET BY MOUTH EVERY DAY WITH BREAKFAST 12/18/20   Tower, Marne A, MD  mometasone (NASONEX) 50 MCG/ACT nasal spray 2 sprays each nostril daily. Stop if any nose bleeding. 12/18/20   Tower, Wynelle Fanny, MD  Multiple Vitamin (MULTIVITAMIN) capsule Take 1 capsule by mouth daily.    [provider]  Multiple Vitamins-Minerals (ZINC PO) Take 1 tablet by mouth daily.    [provider]  OVER THE COUNTER MEDICATION Take 1 tablet by  mouth daily. Nugenix    [provider]    Allergies Augmentin [amoxicillin-pot clavulanate], Crestor [rosuvastatin calcium], and Fluticasone propionate  Family History  Problem Relation Age of Onset   Lung cancer Father    Diabetes Mother    Pancreatic cancer Paternal Grandfather    Colon cancer Neg Hx     Social History Social History   Tobacco Use   Smoking status: Former    Packs/day: 0.50    Years: 3.00    Pack years: 1.50    Types: Cigarettes    Quit date: 09/16/1972    Years since quitting: 48.7   Smokeless tobacco: Never  Substance Use Topics   Alcohol use: No     Alcohol/week: 0.0 standard drinks   Drug use: No    Review of Systems   Review of Systems  Constitutional:  Negative for chills and fever.  Respiratory:  Negative for shortness of breath.   Cardiovascular:  Negative for chest pain and leg swelling.  Musculoskeletal:  Positive for myalgias.  All other systems reviewed and are negative.  Physical Exam Updated Vital Signs BP (!) 167/79   Pulse (!) 52   Temp 97.6 F (36.4 C) (Oral)   Resp 17   Ht '5\' 9"'$  (1.753 m)   Wt 86.2 kg   SpO2 98%   BMI 28.06 kg/m   Physical Exam Vitals and nursing note reviewed.  Constitutional:      General: He is not in acute distress.    Appearance: Normal appearance.  HENT:     Head: Normocephalic and atraumatic.  Eyes:     General: No scleral icterus.    Conjunctiva/sclera: Conjunctivae normal.  Pulmonary:     Effort: Pulmonary effort is normal. No respiratory distress.     Breath sounds: Normal breath sounds. No wheezing.  Musculoskeletal:        General: No deformity or signs of injury.     Cervical back: Normal range of motion.     Comments: Left lower extremity is nontender nonswollen, no skin changes 2+ DP and PT pulse bilaterally Normal strength with plantar flexion dorsiflexion  Skin:    Coloration: Skin is not jaundiced or pale.  Neurological:     General: No focal deficit present.     Mental Status: He is alert and oriented to person, place, and time. Mental status is at baseline.  Psychiatric:        Mood and Affect: Mood normal.        Behavior: Behavior normal.     LABS (all labs ordered are listed, but only abnormal results are displayed)  Labs Reviewed - No data to display ____________________________________________  EKG  N/a ____________________________________________  RADIOLOGY I, Madelin Headings, personally viewed and evaluated these images (plain radiographs) as part of my medical decision making, as well as reviewing the written report by the  radiologist.  ED MD interpretation: I reviewed the results of the DVT ultrasound which is negative for DVT in the left lower extremity    ____________________________________________   PROCEDURES  Procedure(s) performed (including Critical Care):  Procedures   ____________________________________________   INITIAL IMPRESSION / ASSESSMENT AND PLAN / ED COURSE     65 year old male presenting with atraumatic left leg pain.  His exam is benign without signs of arterial insufficiency or DVT.  He has no risk factors for DVT or PE.  A DVT ultrasound was obtained in triage which is negative for acute DVT as expected.  Suspect muscle strain.  Advised elevation and NSAIDs.  Follow-up with his primary care if symptoms or not improving.      ____________________________________________   FINAL CLINICAL IMPRESSION(S) / ED DIAGNOSES  Final diagnoses:  Pain of left lower extremity     ED Discharge Orders     None        Note:  This document was prepared using Dragon voice recognition software and may include unintentional dictation errors.    Rada Hay, MD 05/22/21 1226

## 2021-05-22 NOTE — ED Triage Notes (Signed)
Pt c/o pain and swelling to the leg calf since Sunday.

## 2021-05-22 NOTE — Telephone Encounter (Signed)
Per chart review tab pt went to [facility] ED. Sending note to DR [employer].

## 2021-05-22 NOTE — Telephone Encounter (Signed)
In the ER now  Aware, will watch for correspondence

## 2021-06-11 ENCOUNTER — Other Ambulatory Visit: Payer: Medicare HMO

## 2021-06-18 ENCOUNTER — Other Ambulatory Visit: Payer: Self-pay

## 2021-06-18 ENCOUNTER — Ambulatory Visit (INDEPENDENT_AMBULATORY_CARE_PROVIDER_SITE_OTHER): Payer: Medicare HMO | Admitting: Family Medicine

## 2021-06-18 ENCOUNTER — Encounter: Payer: Self-pay | Admitting: Family Medicine

## 2021-06-18 VITALS — BP 142/82 | HR 52 | Temp 97.8°F | Ht 69.0 in | Wt 189.0 lb

## 2021-06-18 DIAGNOSIS — T466X5A Adverse effect of antihyperlipidemic and antiarteriosclerotic drugs, initial encounter: Secondary | ICD-10-CM

## 2021-06-18 DIAGNOSIS — E785 Hyperlipidemia, unspecified: Secondary | ICD-10-CM

## 2021-06-18 DIAGNOSIS — J301 Allergic rhinitis due to pollen: Secondary | ICD-10-CM

## 2021-06-18 DIAGNOSIS — E119 Type 2 diabetes mellitus without complications: Secondary | ICD-10-CM

## 2021-06-18 DIAGNOSIS — G720 Drug-induced myopathy: Secondary | ICD-10-CM

## 2021-06-18 DIAGNOSIS — R7989 Other specified abnormal findings of blood chemistry: Secondary | ICD-10-CM

## 2021-06-18 DIAGNOSIS — I10 Essential (primary) hypertension: Secondary | ICD-10-CM

## 2021-06-18 DIAGNOSIS — E1169 Type 2 diabetes mellitus with other specified complication: Secondary | ICD-10-CM | POA: Diagnosis not present

## 2021-06-18 LAB — LIPID PANEL
Cholesterol: 188 mg/dL (ref 0–200)
HDL: 44.8 mg/dL (ref >39.00)
LDL Cholesterol: 105 mg/dL — ABNORMAL HIGH (ref 0–99)
NonHDL: 143.54
Total CHOL/HDL Ratio: 4
Triglycerides: 191 mg/dL — ABNORMAL HIGH (ref 0.0–149.0)
VLDL: 38.2 mg/dL (ref 0.0–40.0)

## 2021-06-18 LAB — T4, FREE: Free T4: 0.72 ng/dL (ref 0.60–1.60)

## 2021-06-18 LAB — BASIC METABOLIC PANEL
BUN: 12 mg/dL (ref 6–23)
CO2: 31 mEq/L (ref 19–32)
Calcium: 9.7 mg/dL (ref 8.4–10.5)
Chloride: 102 mEq/L (ref 96–112)
Creatinine, Ser: 1 mg/dL (ref 0.40–1.50)
GFR: 78.77 mL/min (ref >60.00)
Glucose, Bld: 139 mg/dL — ABNORMAL HIGH (ref 70–99)
Potassium: 3.5 mEq/L (ref 3.5–5.1)
Sodium: 141 mEq/L (ref 135–145)

## 2021-06-18 LAB — HEMOGLOBIN A1C: Hgb A1c MFr Bld: 6.7 % — ABNORMAL HIGH (ref 4.6–6.5)

## 2021-06-18 LAB — TSH: TSH: 4.75 u[IU]/mL (ref 0.35–5.50)

## 2021-06-18 MED ORDER — LISINOPRIL 5 MG PO TABS: 5.0000 mg | ORAL_TABLET | Freq: Every day | ORAL | 3 refills | Status: DC

## 2021-06-18 NOTE — Assessment & Plan Note (Signed)
Intolerant of even low dose intermittent statin

## 2021-06-18 NOTE — Assessment & Plan Note (Signed)
Disc goals for lipids and reasons to control them Rev last labs with pt Rev low sat fat diet in detail  Labs ordered Intolerant of all statins -myopathy ? Perhaps consider zetia in the future Enc to back off of beef and sausage and fried food

## 2021-06-18 NOTE — Assessment & Plan Note (Signed)
More congestion/rhinorrhea Adv to re start budensonide nasal spray daily  Continue allegra Update if not improved

## 2021-06-18 NOTE — Patient Instructions (Addendum)
Don't forget to schedule your eye exam in November   For cholesterol Avoid red meat/ fried foods/ egg yolks/ fatty breakfast meats/ butter, cheese and high fat dairy/ and shellfish    [place]ound [place] and [place] sausage are good options   Continue current medicines  Add lisinopril 5 mg daily (blood pressure and kidney protection)  If side effects (low BP or cough) hold it and call Bring your cuff to the next visit   Get back on budesonide for allergies/spray   Labs today

## 2021-06-18 NOTE — Assessment & Plan Note (Signed)
bp in fair control at this time  BP Readings from Last 1 Encounters:  06/18/21 (!) 142/82   Plan to add lisinopril 5 mg daily for this and renal protection  Most recent labs reviewed  Labs ordered Disc lifstyle change with low sodium diet and exercise  bp at home are lower -adv to bring home cuff to next visit

## 2021-06-18 NOTE — Assessment & Plan Note (Signed)
Suspect stable A1C ordered Lipid panel ordered Plan to continue metformin xr 500 mg daily   Starting ace for renal protection Intolerant to all statins even low dose intermittent Plans to schedule eye exam in November Fair diet/ disc low glycemic diet

## 2021-06-18 NOTE — Progress Notes (Signed)
Subjective:    Patient ID: Jerry [name], male    DOB: Jan 21, 1956, 65 y.o.   MRN: [id number]  This visit occurred during the SARS-CoV-2 public health emergency.  Safety protocols were in place, including screening questions prior to the visit, additional usage of staff PPE, and extensive cleaning of exam room while observing appropriate contact time as indicated for disinfecting solutions.   HPI Pt presents for f/u of DM and HTN  Wt Readings from Last 3 Encounters:  06/18/21 189 lb (85.7 kg)  05/22/21 190 lb (86.2 kg)  12/18/20 186 lb 5 oz (84.5 kg)   27.91 kg/m  Doing ok  Taking care of himself   bp is stable today  No cp or palpitations or headaches or edema  No side effects to medicines  BP Readings from Last 3 Encounters:  06/18/21 (!) 142/82  05/22/21 (!) 167/79  12/18/20 132/80    Amlodipine 10 mg daily At home bp has been good  This am 131/64  Lower lately   Lab Results  Component Value Date   CREATININE 1.01 12/11/2020   BUN 19 12/11/2020   NA 142 12/11/2020   K 3.4 (L) 12/11/2020   CL 101 12/11/2020   CO2 32 12/11/2020     DM2 Lab Results  Component Value Date   HGBA1C 6.9 (H) 12/11/2020   Due for labs  Eating well overall / avoids sweets most of the time and feels better  No low hypoglycemia  Exercise-walking regularly   Metformin xr 500 mg daily  Not on ace Eye exam due in nov   Intolerant of statin   Hyperlipidemia Lab Results  Component Value Date   CHOL 210 (H) 12/11/2020   HDL 43.40 12/11/2020   LDLCALC 120 (H) 05/23/2020   LDLDIRECT 132.0 12/11/2020   TRIG 211.0 (H) 12/11/2020   CHOLHDL 5 12/11/2020   Intolerant to low dose intermittent statin  Diet is not bad but not perfect  No fast food  Red meat 1-2 times per week  No bacon, occ sausage    Elevated TSH in the past Lab Results  Component Value Date   TSH 5.08 (H) 12/11/2020   Labs today planned  No change in energy level but he has always had days with no  energy   More sinus trouble  Allegra  Has used budesonide   Declines flu shot  Declines pna vaccine   Patient Active Problem List   Diagnosis Date Noted   Welcome to Medicare preventive visit 12/18/2020   Elevated TSH 12/18/2020   Hypokalemia 12/18/2020   Statin myopathy 12/18/2020   Prostatitis, chronic 02/06/2018   Family history of prostate cancer 02/06/2018   Type 2 diabetes mellitus without complication, with no history of insulin use ([place]) 01/07/2017   Former smoker 01/07/2017   Aortic atherosclerosis ([place]) 01/06/2017   Elevated PSA 04/24/2016   Routine general medical examination at a health care facility 08/05/2011   Prostate cancer screening 08/05/2011   TINNITUS 09/06/2009   COLONIC POLYPS, HX OF 01/16/2009   Obstructive sleep apnea 10/26/2008   Hyperlipidemia associated with type 2 diabetes mellitus ([place]) 03/11/2007   Essential hypertension 03/11/2007   Allergic rhinitis 03/11/2007   ASTHMA 03/11/2007   Past Medical History:  Diagnosis Date   Allergic rhinitis, cause unspecified    Asthma    Diabetes mellitus without complication ([place])    Elbow fracture age 40 or 55   left   History of kidney stones    Hx of  colonic polyps    Other and unspecified hyperlipidemia    Unspecified essential hypertension    Past Surgical History:  Procedure Laterality Date   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   surgery   CHOLECYSTECTOMY     KIDNEY STONE SURGERY     left leg fracture     Social History   Tobacco Use   Smoking status: Former    Packs/day: 0.50    Years: 3.00    Pack years: 1.50    Types: Cigarettes    Quit date: 09/16/1972    Years since quitting: 48.7   Smokeless tobacco: Never  Substance Use Topics   Alcohol use: No    Alcohol/week: 0.0 standard drinks   Drug use: No   Family History  Problem Relation Age of Onset   Lung cancer Father    Diabetes Mother    Pancreatic cancer Paternal Grandfather    Colon cancer Neg Hx    Allergies  Allergen  Reactions   Augmentin [Amoxicillin-Pot Clavulanate] Nausea And Vomiting   Crestor [Rosuvastatin Calcium]     Body pain    Fluticasone Propionate     REACTION: nosebleeds   Current Outpatient Medications on File Prior to Visit  Medication Sig Dispense Refill   amLODipine (NORVASC) 10 MG tablet Take 1 tablet (10 mg total) by mouth daily. 90 tablet 3   Ascorbic Acid (VITAMIN C) 100 MG tablet Take 100 mg by mouth daily.     budesonide (RHINOCORT AQUA) 32 MCG/ACT nasal spray Place 2 sprays into both nostrils daily. 5 mL 11   ELDERBERRY PO Take 1 tablet by mouth daily.     fexofenadine (ALLEGRA) 180 MG tablet Take 180 mg by mouth daily.     metFORMIN (GLUCOPHAGE-XR) 500 MG 24 hr tablet TAKE 1 TABLET BY MOUTH EVERY DAY WITH BREAKFAST 90 tablet 3   Multiple Vitamin (MULTIVITAMIN) capsule Take 1 capsule by mouth daily.     Multiple Vitamins-Minerals (ZINC PO) Take 1 tablet by mouth daily.     OVER THE COUNTER MEDICATION Take 1 tablet by mouth daily. Nugenix     tamsulosin (FLOMAX) 0.4 MG CAPS capsule Take 0.4 mg by mouth.     No current facility-administered medications on file prior to visit.    Review of Systems  Constitutional:  Positive for fatigue. Negative for activity change, appetite change, fever and unexpected weight change.       Baseline intermittent fatigue  HENT:  Negative for congestion, rhinorrhea, sore throat and trouble swallowing.   Eyes:  Negative for pain, redness, itching and visual disturbance.  Respiratory:  Negative for cough, chest tightness, shortness of breath and wheezing.   Cardiovascular:  Negative for chest pain and palpitations.  Gastrointestinal:  Negative for abdominal pain, blood in stool, constipation, diarrhea and nausea.  Endocrine: Negative for cold intolerance, heat intolerance, polydipsia and polyuria.  Genitourinary:  Negative for difficulty urinating, dysuria, frequency and urgency.  Musculoskeletal:  Negative for arthralgias, joint swelling and  myalgias.  Skin:  Negative for pallor and rash.  Neurological:  Negative for dizziness, tremors, weakness, numbness and headaches.  Hematological:  Negative for adenopathy. Does not bruise/bleed easily.  Psychiatric/Behavioral:  Negative for decreased concentration and dysphoric mood. The patient is not nervous/anxious.       Objective:   Physical Exam Constitutional:      General: He is not in acute distress.    Appearance: Normal appearance. He is well-developed and normal weight. He is not ill-appearing.  HENT:  Head: Normocephalic and atraumatic.  Eyes:     Conjunctiva/sclera: Conjunctivae normal.     Pupils: Pupils are equal, round, and reactive to light.  Neck:     Thyroid: No thyromegaly.     Vascular: No carotid bruit or JVD.  Cardiovascular:     Rate and Rhythm: Normal rate and regular rhythm.     Heart sounds: Normal heart sounds.    No gallop.  Pulmonary:     Effort: Pulmonary effort is normal. No respiratory distress.     Breath sounds: Normal breath sounds. No wheezing or rales.  Abdominal:     General: Bowel sounds are normal. There is no distension or abdominal bruit.     Palpations: Abdomen is soft. There is no mass.     Tenderness: There is no abdominal tenderness.  Musculoskeletal:     Cervical back: Normal range of motion and neck supple.     Right lower leg: No edema.     Left lower leg: No edema.  Lymphadenopathy:     Cervical: No cervical adenopathy.  Skin:    General: Skin is warm and dry.     Coloration: Skin is not pale.     Findings: No rash.  Neurological:     Mental Status: He is alert.     Sensory: No sensory deficit.     Coordination: Coordination normal.     Deep Tendon Reflexes: Reflexes are normal and symmetric. Reflexes normal.  Psychiatric:        Mood and Affect: Mood normal.          Assessment & Plan:   Problem List Items Addressed This Visit       Cardiovascular and Mediastinum   Essential hypertension    bp in  fair control at this time  BP Readings from Last 1 Encounters:  06/18/21 (!) 142/82  Plan to add lisinopril 5 mg daily for this and renal protection  Most recent labs reviewed  Labs ordered Disc lifstyle change with low sodium diet and exercise  bp at home are lower -adv to bring home cuff to next visit      Relevant Medications   lisinopril (ZESTRIL) 5 MG tablet     Respiratory   Allergic rhinitis    More congestion/rhinorrhea Adv to re start budensonide nasal spray daily  Continue allegra Update if not improved        Endocrine   Hyperlipidemia associated with type 2 diabetes mellitus ([place])    Disc goals for lipids and reasons to control them Rev last labs with pt Rev low sat fat diet in detail  Labs ordered Intolerant of all statins -myopathy ? Perhaps consider zetia in the future Enc to back off of beef and sausage and fried food      Relevant Medications   lisinopril (ZESTRIL) 5 MG tablet   Type 2 diabetes mellitus without complication, with no history of insulin use ([facility]) - Primary    Suspect stable A1C ordered Lipid panel ordered Plan to continue metformin xr 500 mg daily   Starting ace for renal protection Intolerant to all statins even low dose intermittent Plans to schedule eye exam in November Fair diet/ disc low glycemic diet       Relevant Medications   lisinopril (ZESTRIL) 5 MG tablet     Musculoskeletal and Integument   Statin myopathy    Intolerant of even low dose intermittent statin        Other   Elevated TSH  No clinical changes Disc poss future hypothyroidism  TSH and FT4 ordered today

## 2021-06-18 NOTE — Assessment & Plan Note (Signed)
No clinical changes Disc poss future hypothyroidism  TSH and FT4 ordered today

## 2021-06-21 ENCOUNTER — Encounter (HOSPITAL_COMMUNITY): Payer: Self-pay

## 2021-06-21 ENCOUNTER — Other Ambulatory Visit: Payer: Self-pay

## 2021-06-21 ENCOUNTER — Emergency Department (HOSPITAL_COMMUNITY): Payer: Medicare HMO

## 2021-06-21 ENCOUNTER — Emergency Department (HOSPITAL_COMMUNITY)
Admission: EM | Admit: 2021-06-21 | Discharge: 2021-06-21 | Disposition: A | Discharge status: Home / Self Care | Payer: Medicare HMO | Attending: Emergency Medicine | Admitting: Emergency Medicine

## 2021-06-21 DIAGNOSIS — S0083XA Contusion of other part of head, initial encounter: Secondary | ICD-10-CM | POA: Diagnosis not present

## 2021-06-21 DIAGNOSIS — Z87891 Personal history of nicotine dependence: Secondary | ICD-10-CM | POA: Insufficient documentation

## 2021-06-21 DIAGNOSIS — S42301A Unspecified fracture of shaft of humerus, right arm, initial encounter for closed fracture: Secondary | ICD-10-CM | POA: Diagnosis not present

## 2021-06-21 DIAGNOSIS — Z7951 Long term (current) use of inhaled steroids: Secondary | ICD-10-CM | POA: Diagnosis not present

## 2021-06-21 DIAGNOSIS — S59911A Unspecified injury of right forearm, initial encounter: Secondary | ICD-10-CM | POA: Diagnosis not present

## 2021-06-21 DIAGNOSIS — W01198A Fall on same level from slipping, tripping and stumbling with subsequent striking against other object, initial encounter: Secondary | ICD-10-CM | POA: Insufficient documentation

## 2021-06-21 DIAGNOSIS — Z23 Encounter for immunization: Secondary | ICD-10-CM | POA: Insufficient documentation

## 2021-06-21 DIAGNOSIS — E119 Type 2 diabetes mellitus without complications: Secondary | ICD-10-CM | POA: Diagnosis not present

## 2021-06-21 DIAGNOSIS — S0990XA Unspecified injury of head, initial encounter: Secondary | ICD-10-CM | POA: Diagnosis not present

## 2021-06-21 DIAGNOSIS — I10 Essential (primary) hypertension: Secondary | ICD-10-CM | POA: Insufficient documentation

## 2021-06-21 DIAGNOSIS — J45909 Unspecified asthma, uncomplicated: Secondary | ICD-10-CM | POA: Diagnosis not present

## 2021-06-21 DIAGNOSIS — Z7984 Long term (current) use of oral hypoglycemic drugs: Secondary | ICD-10-CM | POA: Insufficient documentation

## 2021-06-21 DIAGNOSIS — R0781 Pleurodynia: Secondary | ICD-10-CM | POA: Diagnosis not present

## 2021-06-21 DIAGNOSIS — Z743 Need for continuous supervision: Secondary | ICD-10-CM | POA: Diagnosis not present

## 2021-06-21 DIAGNOSIS — Z79899 Other long term (current) drug therapy: Secondary | ICD-10-CM | POA: Insufficient documentation

## 2021-06-21 DIAGNOSIS — R0902 Hypoxemia: Secondary | ICD-10-CM | POA: Diagnosis not present

## 2021-06-21 DIAGNOSIS — M79601 Pain in right arm: Secondary | ICD-10-CM | POA: Insufficient documentation

## 2021-06-21 DIAGNOSIS — S59901A Unspecified injury of right elbow, initial encounter: Secondary | ICD-10-CM | POA: Diagnosis not present

## 2021-06-21 DIAGNOSIS — R519 Headache, unspecified: Secondary | ICD-10-CM | POA: Diagnosis not present

## 2021-06-21 DIAGNOSIS — T1490XA Injury, unspecified, initial encounter: Secondary | ICD-10-CM

## 2021-06-21 MED ORDER — IBUPROFEN 400 MG PO TABS
600.0000 mg | ORAL_TABLET | Freq: Once | ORAL | Status: AC
  Administered 2021-06-21: 600 mg via ORAL
  Filled 2021-06-21: qty 1

## 2021-06-21 MED ORDER — ACETAMINOPHEN 500 MG PO TABS
1000.0000 mg | ORAL_TABLET | Freq: Once | ORAL | Status: AC
  Administered 2021-06-21: 1000 mg via ORAL
  Filled 2021-06-21: qty 2

## 2021-06-21 MED ORDER — TETANUS-DIPHTH-ACELL PERTUSSIS 5-2.5-18.5 LF-MCG/0.5 IM SUSY
0.5000 mL | PREFILLED_SYRINGE | Freq: Once | INTRAMUSCULAR | Status: AC
  Administered 2021-06-21: 0.5 mL via INTRAMUSCULAR
  Filled 2021-06-21: qty 0.5

## 2021-06-21 NOTE — Discharge Instructions (Addendum)
Your imaging was negative for any acute injuries.  Please follow-up with your primary care doctor and return to the emergency department with new or worsening symptoms including chest pain, shortness of breath, loss of consciousness, and altered mental status.  You can take Tylenol 1000 mg and ibuprofen 600 mg alternating every 4 hours as needed for pain.

## 2021-06-21 NOTE — ED Provider Notes (Signed)
[facility] EMERGENCY DEPARTMENT Provider Note   CSN: [id number] Arrival date & time: 06/21/21  1701     History Chief Complaint  Patient presents with   lawnmower accident    Jerry [name] is a 65 y.o. male.  HPI This is a 65 year old patient with history of diabetes, hypertension, asthma, and prior carotid artery fistula status post repair who presents after lawnmower accident.  Patient reportedly had lawnmower elevated on a jack and was doing work underneath it.  Lawnmower fell off the jack and the lawnmower fell onto the patient.  He was able to catch it with his arms but both arms got caught in the bottom of the machine.  Lawnmower was not on.  Lawnmower struck him on the right side of the head and he was reporting pain in his bilateral temples described as dull, throbbing, nonradiating, 3 out of 10 in severity.  Also reports pain in his right elbow which also struck the lawnmower described as sharp, 5 out of 10, nonradiating, worse with movement.  Patient is not on a blood thinner.  He denies loss of consciousness.  He was on the ground for 10 to 15 minutes before help arrived and he was able to get up and ambulate.     Past Medical History:  Diagnosis Date   Allergic rhinitis, cause unspecified    Asthma    Diabetes mellitus without complication ([place])    Elbow fracture age 72 or 36   left   History of kidney stones    Hx of colonic polyps    Other and unspecified hyperlipidemia    Unspecified essential hypertension     Patient Active Problem List   Diagnosis Date Noted   Welcome to Medicare preventive visit 12/18/2020   Elevated TSH 12/18/2020   Hypokalemia 12/18/2020   Statin myopathy 12/18/2020   Prostatitis, chronic 02/06/2018   Family history of prostate cancer 02/06/2018   Type 2 diabetes mellitus without complication, with no history of insulin use ([place]) 01/07/2017   Former smoker 01/07/2017   Aortic atherosclerosis ([place]) 01/06/2017    Elevated PSA 04/24/2016   Routine general medical examination at a health care facility 08/05/2011   Prostate cancer screening 08/05/2011   TINNITUS 09/06/2009   COLONIC POLYPS, HX OF 01/16/2009   Obstructive sleep apnea 10/26/2008   Hyperlipidemia associated with type 2 diabetes mellitus ([place]) 03/11/2007   Essential hypertension 03/11/2007   Allergic rhinitis 03/11/2007   ASTHMA 03/11/2007    Past Surgical History:  Procedure Laterality Date   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   surgery   CHOLECYSTECTOMY     KIDNEY STONE SURGERY     left leg fracture         Family History  Problem Relation Age of Onset   Lung cancer Father    Diabetes Mother    Pancreatic cancer Paternal Grandfather    Colon cancer Neg Hx     Social History   Tobacco Use   Smoking status: Former    Packs/day: 0.50    Years: 3.00    Pack years: 1.50    Types: Cigarettes    Quit date: 09/16/1972    Years since quitting: 48.7   Smokeless tobacco: Never  Substance Use Topics   Alcohol use: No    Alcohol/week: 0.0 standard drinks   Drug use: No    Home Medications Prior to Admission medications   Medication Sig Start Date End Date Taking? Authorizing Provider  amLODipine (NORVASC) 10  MG tablet Take 1 tablet (10 mg total) by mouth daily. 12/18/20   Tower, Wynelle Fanny, MD  Ascorbic Acid (VITAMIN C) 100 MG tablet Take 100 mg by mouth daily.    [provider]  budesonide (RHINOCORT AQUA) 32 MCG/ACT nasal spray Place 2 sprays into both nostrils daily. 02/01/21   Tower, Wynelle Fanny, MD  ELDERBERRY PO Take 1 tablet by mouth daily.    [provider]  fexofenadine (ALLEGRA) 180 MG tablet Take 180 mg by mouth daily.    [provider]  lisinopril (ZESTRIL) 5 MG tablet Take 1 tablet (5 mg total) by mouth daily. 06/18/21   Tower, Wynelle Fanny, MD  metFORMIN (GLUCOPHAGE-XR) 500 MG 24 hr tablet TAKE 1 TABLET BY MOUTH EVERY DAY WITH BREAKFAST 12/18/20   Tower, Wynelle Fanny, MD  Multiple Vitamin  (MULTIVITAMIN) capsule Take 1 capsule by mouth daily.    [provider]  Multiple Vitamins-Minerals (ZINC PO) Take 1 tablet by mouth daily.    [provider]  OVER THE COUNTER MEDICATION Take 1 tablet by mouth daily. Nugenix    [provider]  tamsulosin (FLOMAX) 0.4 MG CAPS capsule Take 0.4 mg by mouth.    [provider]    Allergies    Augmentin [amoxicillin-pot clavulanate], Crestor [rosuvastatin calcium], and Fluticasone propionate  Review of Systems   Review of Systems  Constitutional:  Negative for chills and fever.  HENT:  Negative for ear pain and sore throat.   Eyes:  Negative for pain and visual disturbance.  Respiratory:  Negative for cough and shortness of breath.   Cardiovascular:  Negative for chest pain and palpitations.  Gastrointestinal:  Negative for abdominal pain and vomiting.  Genitourinary:  Negative for dysuria and hematuria.  Musculoskeletal:  Negative for arthralgias and back pain.  Skin:  Negative for color change and rash.  Neurological:  Positive for headaches. Negative for seizures, syncope and light-headedness.  All other systems reviewed and are negative.  Physical Exam Updated Vital Signs BP (!) 157/81   Pulse 66   Temp 98.2 F (36.8 C) (Temporal)   Resp 18   Ht 5\' 9"  (1.753 m)   Wt 84.8 kg   SpO2 94%   BMI 27.62 kg/m   Physical Exam Vitals and nursing note reviewed.  Constitutional:      Appearance: He is well-developed.  HENT:     Head: Normocephalic.     Comments: Hematomas to bilateral temples. Abrasion to top of head. TTP of L zygoma.     Nose: Nose normal.     Mouth/Throat:     Mouth: Mucous membranes are moist.     Pharynx: Oropharynx is clear.  Eyes:     Extraocular Movements: Extraocular movements intact.     Conjunctiva/sclera: Conjunctivae normal.     Pupils: Pupils are equal, round, and reactive to light.  Cardiovascular:     Rate and Rhythm: Normal rate and regular rhythm.      Heart sounds: No murmur heard. Pulmonary:     Effort: Pulmonary effort is normal. No respiratory distress.     Breath sounds: Normal breath sounds.  Chest:     Chest wall: No tenderness.  Abdominal:     Palpations: Abdomen is soft.     Tenderness: There is no abdominal tenderness.  Musculoskeletal:        General: Normal range of motion.     Cervical back: Neck supple. No rigidity or tenderness.     Comments: Swelling and TTP  of R forearm. 2+ pulses in bilateral UE and RLE. 1+ pulse in LLE. No pain with ROM of LLE, no swelling, or skin changes and is warm.   Skin:    General: Skin is warm and dry.     Findings: No bruising.  Neurological:     General: No focal deficit present.     Mental Status: He is alert. Mental status is at baseline.    ED Results / Procedures / Treatments   Labs (all labs ordered are listed, but only abnormal results are displayed) Labs Reviewed - No data to display  EKG None  Radiology DG Elbow 2 Views Right  Result Date: 06/21/2021 CLINICAL DATA:  Trauma. EXAM: RIGHT HUMERUS - 2+ VIEW; RIGHT FOREARM - 2 VIEW; RIGHT ELBOW - 2 VIEW COMPARISON:  None. FINDINGS: Right elbow: There is no acute fracture or dislocation. Joint spaces are maintained. Soft tissues are within normal limits. Right forearm: There is no acute fracture or dislocation. Joint spaces are maintained. Soft tissues are within normal limits. Right humerus: There is no acute fracture or dislocation. Joint spaces are maintained. Soft tissues are within normal limits. IMPRESSION: 1. No acute fracture or dislocation of the right humerus, right elbow or right forearm. Electronically Signed   By: Ronney Asters M.D.   On: 06/21/2021 19:38   DG Forearm Right  Result Date: 06/21/2021 CLINICAL DATA:  Trauma. EXAM: RIGHT HUMERUS - 2+ VIEW; RIGHT FOREARM - 2 VIEW; RIGHT ELBOW - 2 VIEW COMPARISON:  None. FINDINGS: Right elbow: There is no acute fracture or dislocation. Joint spaces are maintained. Soft  tissues are within normal limits. Right forearm: There is no acute fracture or dislocation. Joint spaces are maintained. Soft tissues are within normal limits. Right humerus: There is no acute fracture or dislocation. Joint spaces are maintained. Soft tissues are within normal limits. IMPRESSION: 1. No acute fracture or dislocation of the right humerus, right elbow or right forearm. Electronically Signed   By: Ronney Asters M.D.   On: 06/21/2021 19:38   CT HEAD WO CONTRAST (5MM)  Result Date: 06/21/2021 CLINICAL DATA:  Recent lawnmower accident with headaches, initial encounter EXAM: CT HEAD WITHOUT CONTRAST TECHNIQUE: Contiguous axial images were obtained from the base of the skull through the vertex without intravenous contrast. COMPARISON:  05/07/2019 FINDINGS: Brain: No evidence of acute infarction, hemorrhage, hydrocephalus, extra-axial collection or mass lesion/mass effect. Scattered chronic white matter ischemic changes are noted similar to that seen on prior examination. Vascular: Persistent coils are noted within the distal aspect of the left carotid artery stable from the prior exam. Skull: Normal. Negative for fracture or focal lesion. Sinuses/Orbits: No acute finding. Other: None. IMPRESSION: Chronic white matter ischemic changes without acute abnormality. Changes consistent with prior coiling in the region of the distal left internal carotid artery. Electronically Signed   By: Inez Catalina M.D.   On: 06/21/2021 19:50   DG Humerus Right  Result Date: 06/21/2021 CLINICAL DATA:  Trauma. EXAM: RIGHT HUMERUS - 2+ VIEW; RIGHT FOREARM - 2 VIEW; RIGHT ELBOW - 2 VIEW COMPARISON:  None. FINDINGS: Right elbow: There is no acute fracture or dislocation. Joint spaces are maintained. Soft tissues are within normal limits. Right forearm: There is no acute fracture or dislocation. Joint spaces are maintained. Soft tissues are within normal limits. Right humerus: There is no acute fracture or dislocation. Joint  spaces are maintained. Soft tissues are within normal limits. IMPRESSION: 1. No acute fracture or dislocation of the right humerus, right elbow  or right forearm. Electronically Signed   By: Ronney Asters M.D.   On: 06/21/2021 19:38   CT Maxillofacial Wo Contrast  Result Date: 06/21/2021 CLINICAL DATA:  Lawnmower accident with facial pain, initial encounter EXAM: CT MAXILLOFACIAL WITHOUT CONTRAST TECHNIQUE: Multidetector CT imaging of the maxillofacial structures was performed. Multiplanar CT image reconstructions were also generated. COMPARISON:  None. FINDINGS: Osseous: No fracture or mandibular dislocation. No destructive process. Orbits: Orbits and their contents are within normal limits. Sinuses: Clear. Soft tissues: Negative. Limited intracranial: Embolization coils are again noted in the distal aspect of left carotid artery. IMPRESSION: No acute fracture is noted. Electronically Signed   By: Inez Catalina M.D.   On: 06/21/2021 19:52    Procedures Procedures   Medications Ordered in ED Medications  acetaminophen (TYLENOL) tablet 1,000 mg (1,000 mg Oral Given 06/21/21 1822)  ibuprofen (ADVIL) tablet 600 mg (600 mg Oral Given 06/21/21 1822)  Tdap (BOOSTRIX) injection 0.5 mL (0.5 mLs Intramuscular Given 06/21/21 1825)    ED Course  I have reviewed the triage vital signs and the nursing notes.  Pertinent labs & imaging results that were available during my care of the patient were reviewed by me and considered in my medical decision making (see chart for details).    MDM Rules/Calculators/A&P                         Upon arrival of the patient, EMS provided pertinent history and exam findings. The patient was transferred over to the trauma bed. ABCs intact as exam above. GCS 15. Once IV access was placed and/or confirmed, the secondary exam was performed. Pertinent physical exam findings include small hematoma to R and L temple. Abrasions to RUE with TTP of forearm and elbow.   CT of  the head  and face was performed as above along with plain films of RUE. No acute traumatic injuries. Patient ambulated without difficulty. Pain was well controlled with Tylenol and Motrin. Tdap updated. Patient made aware of pulse deficit in LLE. No evidence of arterial compromise. No swelling or evidence of traumatic injury or DVT. Advised to follow up with PCP.   Strict return precautions provided. Patient's pain consistent with muscular strain of R arm. Conservative therapy instructions given including Tylenol, NSAIDs, ice/heat, massage, and stretches. Typical course of this pain is explained. Strict return precautions given.  Encouraged him to follow-up with his PCP on an outpatient basis. Questions were answered. Patient discharged in stable condition.     Final Clinical Impression(s) / ED Diagnoses Final diagnoses:  Injury of head, initial encounter  Right arm pain  Trauma    Rx / DC Orders ED Discharge Orders     None        Coralee Pesa, MD 06/22/21 0226    Deno Etienne, DO 06/22/21 1624

## 2021-06-21 NOTE — ED Notes (Signed)
RN reviewed discharge instructions w/ pt. Follow up and pain management reviewed, pt had no further questions. 

## 2021-06-21 NOTE — ED Notes (Signed)
Patient transported to X-ray 

## 2021-07-03 ENCOUNTER — Telehealth: Payer: Self-pay | Admitting: Family Medicine

## 2021-07-03 NOTE — Telephone Encounter (Signed)
The allegra 180 is once daily -not recommended more than that If not helpful, try a switch to zyrtec or xyzal (both otc) Continue the budesonide nasal spray also   If not helpful, let me know  Try to keep out of dust/pollen and mold or wear a mask

## 2021-07-03 NOTE — Telephone Encounter (Signed)
Pt called in stating that since covid his allergies have been over productive and pt Is taking allegra x2 and pt wants to know if its ok taking this amount or should he be taking something else. Pt wants a call back

## 2021-07-06 NOTE — Telephone Encounter (Signed)
Per DPR Left VM letting pt know Dr. Marliss Coots comments and to update [place] if no improvement

## 2021-07-30 DIAGNOSIS — Z0100 Encounter for examination of eyes and vision without abnormal findings: Secondary | ICD-10-CM | POA: Diagnosis not present

## 2021-07-30 DIAGNOSIS — H5203 Hypermetropia, bilateral: Secondary | ICD-10-CM | POA: Diagnosis not present

## 2021-07-30 DIAGNOSIS — H524 Presbyopia: Secondary | ICD-10-CM | POA: Diagnosis not present

## 2021-11-26 ENCOUNTER — Other Ambulatory Visit: Payer: Self-pay | Admitting: Family Medicine

## 2021-12-18 ENCOUNTER — Ambulatory Visit: Payer: Medicare HMO | Admitting: Family Medicine

## 2021-12-19 LAB — HM DIABETES EYE EXAM

## 2021-12-20 ENCOUNTER — Ambulatory Visit (INDEPENDENT_AMBULATORY_CARE_PROVIDER_SITE_OTHER): Payer: Medicare HMO | Admitting: Family Medicine

## 2021-12-20 VITALS — BP 158/78 | HR 54 | Temp 97.7°F | Ht 69.0 in | Wt 193.4 lb

## 2021-12-20 DIAGNOSIS — M67449 Ganglion, unspecified hand: Secondary | ICD-10-CM | POA: Insufficient documentation

## 2021-12-20 DIAGNOSIS — H93A2 Pulsatile tinnitus, left ear: Secondary | ICD-10-CM | POA: Insufficient documentation

## 2021-12-20 DIAGNOSIS — G4733 Obstructive sleep apnea (adult) (pediatric): Secondary | ICD-10-CM | POA: Diagnosis not present

## 2021-12-20 DIAGNOSIS — I10 Essential (primary) hypertension: Secondary | ICD-10-CM

## 2021-12-20 DIAGNOSIS — E119 Type 2 diabetes mellitus without complications: Secondary | ICD-10-CM

## 2021-12-20 LAB — POCT GLYCOSYLATED HEMOGLOBIN (HGB A1C): Hemoglobin A1C: 7 % — AB (ref 4.0–5.6)

## 2021-12-20 MED ORDER — LISINOPRIL 10 MG PO TABS: 10.0000 mg | ORAL_TABLET | Freq: Every day | ORAL | 3 refills | Status: DC

## 2021-12-20 MED ORDER — METFORMIN HCL ER 500 MG PO TB24: 1000.0000 mg | ORAL_TABLET | Freq: Every day | ORAL | 1 refills | Status: DC

## 2021-12-20 NOTE — Progress Notes (Signed)
? ?Subjective:  ? ? Patient ID: Jerry [name], male    DOB: [birth year], 66 y.o.   MRN: [id number] ? ?HPI ?Pt presents for f/u of DM2 and HTn ? ?Wt Readings from Last 3 Encounters:  ?12/20/21 193 lb 6 oz (87.7 kg)  ?06/21/21 187 lb (84.8 kg)  ?06/18/21 189 lb (85.7 kg)  ? ?28.56 kg/m? ? ?Hears pulse in L ear on and off ?No palpitations  ? ? ?Wt is up ?Busy  ?Feels tired in the am / gets better later in the day  ?Sleeps ok  ? ?Does not snore  ?Uses cpap machine regularly  ? ? ?HTN ?bp is stable today  ?No cp or palpitations or headaches or edema  ?No side effects to medicines  ?BP Readings from Last 3 Encounters:  ?12/20/21 (!) 158/78  ?06/21/21 (!) 157/81  ?06/18/21 (!) 142/82  ? ? ?Lisinopril 5 mg daily ?Missed it this am  ? ? ? ?DM2 ?Lab Results  ?Component Value Date  ? HGBA1C 6.7 (H) 06/18/2021  ? ?Today is up some  ?Lab Results  ?Component Value Date  ? HGBA1C 7.0 (A) 12/20/2021  ? ? ?Thinks he is doing well  ?Metformin xr 500 mg daily  ?Ace ?Intol of all statins  ? ? ?Diet is fair /sometimes it hard to eat well for convenience ?Traveling is tough (also caring for mother with a stroke)  ? ?Not enough exercise  ?Started a little recently  ?Struggling after covid - took over a year to get over that entirely, also pollen  ? ? ?Eye exam 07/30/21 ? ? ?Lab Results  ?Component Value Date  ? CHOL 188 06/18/2021  ? HDL 44.80 06/18/2021  ? LDLCALC 105 (H) 06/18/2021  ? LDLDIRECT 132.0 12/11/2020  ? TRIG 191.0 (H) 06/18/2021  ? CHOLHDL 4 06/18/2021  ? ?Intol of any statin/myopathy  ?This was improved  ? ?Has a cyst on R 5th finger  ?Very bothersome  ? ?Patient Active Problem List  ? Diagnosis Date Noted  ? Pulsatile tinnitus, left ear 12/20/2021  ? Digital mucinous cyst of finger 12/20/2021  ? Welcome to Medicare preventive visit 12/18/2020  ? Elevated TSH 12/18/2020  ? Hypokalemia 12/18/2020  ? Statin myopathy 12/18/2020  ? Prostatitis, chronic 02/06/2018  ? Family history of prostate cancer 02/06/2018  ? Type 2  diabetes mellitus without complication, with no history of insulin use ([place]) 01/07/2017  ? Former smoker 01/07/2017  ? Aortic atherosclerosis ([place]) 01/06/2017  ? Elevated PSA 04/24/2016  ? Routine general medical examination at a health care facility 08/05/2011  ? Prostate cancer screening 08/05/2011  ? TINNITUS 09/06/2009  ? COLONIC POLYPS, HX OF 01/16/2009  ? Obstructive sleep apnea 10/26/2008  ? Hyperlipidemia associated with type 2 diabetes mellitus ([place]) 03/11/2007  ? Essential hypertension 03/11/2007  ? Allergic rhinitis 03/11/2007  ? ASTHMA 03/11/2007  ? ?Past Medical History:  ?Diagnosis Date  ? Allergic rhinitis, cause unspecified   ? Asthma   ? Diabetes mellitus without complication ([place])   ? Elbow fracture age 71 or 89  ? left  ? History of kidney stones   ? Hx of colonic polyps   ? Other and unspecified hyperlipidemia   ? Unspecified essential hypertension   ? ?Past Surgical History:  ?Procedure Laterality Date  ? 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  ? surgery  ? CHOLECYSTECTOMY    ? KIDNEY STONE SURGERY    ? left leg fracture    ? ?Social History  ? ?Tobacco Use  ?  Smoking status: Former  ?  Packs/day: 0.50  ?  Years: 3.00  ?  Pack years: 1.50  ?  Types: Cigarettes  ?  Quit date: 09/16/1972  ?  Years since quitting: 49.2  ? Smokeless tobacco: Never  ?Substance Use Topics  ? Alcohol use: No  ?  Alcohol/week: 0.0 standard drinks  ? Drug use: No  ? ?Family History  ?Problem Relation Age of Onset  ? Lung cancer Father   ? Diabetes Mother   ? Pancreatic cancer Paternal Grandfather   ? Colon cancer Neg Hx   ? ?Allergies  ?Allergen Reactions  ? Augmentin [Amoxicillin-Pot Clavulanate] Nausea And Vomiting  ? Crestor [Rosuvastatin Calcium]   ?  Body pain   ? Fluticasone Propionate   ?  REACTION: nosebleeds  ? ?Current Outpatient Medications on File Prior to Visit  ?Medication Sig Dispense Refill  ? amLODipine (NORVASC) 10 MG tablet TAKE 1 TABLET BY MOUTH EVERY DAY 90 tablet 1  ? Ascorbic Acid (VITAMIN C) 100 MG  tablet Take 100 mg by mouth daily.    ? budesonide (RHINOCORT AQUA) 32 MCG/ACT nasal spray Place 2 sprays into both nostrils daily. 5 mL 11  ? ELDERBERRY PO Take 1 tablet by mouth daily.    ? fexofenadine (ALLEGRA) 180 MG tablet Take 180 mg by mouth daily.    ? Multiple Vitamin (MULTIVITAMIN) capsule Take 1 capsule by mouth daily.    ? Multiple Vitamins-Minerals (ZINC PO) Take 1 tablet by mouth daily.    ? OVER THE COUNTER MEDICATION Take 1 tablet by mouth daily. Nugenix    ? tamsulosin (FLOMAX) 0.4 MG CAPS capsule Take 0.4 mg by mouth.    ? ?No current facility-administered medications on file prior to visit.  ?  ?Review of Systems  ?Constitutional:  Positive for fatigue. Negative for activity change, appetite change, fever and unexpected weight change.  ?HENT:  Negative for congestion, rhinorrhea, sore throat and trouble swallowing.   ?Eyes:  Negative for pain, redness, itching and visual disturbance.  ?Respiratory:  Negative for cough, chest tightness, shortness of breath and wheezing.   ?Cardiovascular:  Negative for chest pain and palpitations.  ?Gastrointestinal:  Negative for abdominal pain, blood in stool, constipation, diarrhea and nausea.  ?Endocrine: Negative for cold intolerance, heat intolerance, polydipsia and polyuria.  ?Genitourinary:  Negative for difficulty urinating, dysuria, frequency and urgency.  ?Musculoskeletal:  Positive for arthralgias. Negative for joint swelling and myalgias.  ?Skin:  Negative for pallor and rash.  ?Neurological:  Negative for dizziness, tremors, weakness, numbness and headaches.  ?Hematological:  Negative for adenopathy. Does not bruise/bleed easily.  ?Psychiatric/Behavioral:  Negative for decreased concentration and dysphoric mood. The patient is not nervous/anxious.   ? ?   ?Objective:  ? Physical Exam ?Constitutional:   ?   General: He is not in acute distress. ?   Appearance: Normal appearance. He is well-developed and normal weight. He is not ill-appearing or  diaphoretic.  ?HENT:  ?   Head: Normocephalic and atraumatic.  ?   Right Ear: Tympanic membrane, ear canal and external ear normal.  ?   Left Ear: Tympanic membrane, ear canal and external ear normal.  ?   Nose: Nose normal.  ?   Mouth/Throat:  ?   Mouth: Mucous membranes are moist.  ?   Pharynx: Oropharynx is clear.  ?Eyes:  ?   Conjunctiva/sclera: Conjunctivae normal.  ?   Pupils: Pupils are equal, round, and reactive to light.  ?Neck:  ?   Thyroid:  No thyromegaly.  ?   Vascular: No carotid bruit or JVD.  ?Cardiovascular:  ?   Rate and Rhythm: Normal rate and regular rhythm.  ?   Heart sounds: Normal heart sounds.  ?  No gallop.  ?Pulmonary:  ?   Effort: Pulmonary effort is normal. No respiratory distress.  ?   Breath sounds: Normal breath sounds. No wheezing or rales.  ?Abdominal:  ?   General: There is no distension or abdominal bruit.  ?   Palpations: Abdomen is soft.  ?Musculoskeletal:  ?   Cervical back: Normal range of motion and neck supple.  ?   Right lower leg: No edema.  ?   Left lower leg: No edema.  ?   Comments: Cyst on distal pip joint of R 5th finger ?Resembles mucous cyst/translucent  ? ?Non tender   ?Lymphadenopathy:  ?   Cervical: No cervical adenopathy.  ?Skin: ?   General: Skin is warm and dry.  ?   Coloration: Skin is not pale.  ?   Findings: No rash.  ?Neurological:  ?   Mental Status: He is alert.  ?   Coordination: Coordination normal.  ?   Deep Tendon Reflexes: Reflexes are normal and symmetric. Reflexes normal.  ?Psychiatric:     ?   Mood and Affect: Mood normal.  ? ? ? ? ? ?   ?Assessment & Plan:  ? ?Problem List Items Addressed This Visit   ? ?  ? Cardiovascular and Mediastinum  ? Essential hypertension  ?  BP: (!) 158/78 ? ?  ?Missed his 5 mg lisinopril today  ? ?Plan to inc to lisinopril 10 mg daily  ?Encouraged good diet/exercise habits and less processed foods  ?  ?  ? Relevant Medications  ? lisinopril (ZESTRIL) 10 MG tablet  ?  ? Respiratory  ? Obstructive sleep apnea  ?  More  fatigue lately Binnie Kand restful sleep  ?Ref to pulmonary for f/u  ?May need cpap pressure adjustment  ?  ?  ? Relevant Orders  ? Ambulatory referral to Pulmonology  ?  ? Endocrine  ? Type 2 diabetes mellitus without complic

## 2021-12-20 NOTE — Patient Instructions (Signed)
Go up on lisinopril to 10 mg daily  ?Go up on metformin xr to 2 pills (1000 mg total) daily  ?Watch diet  ? ?Start some exercise  ? ?I placed referral to pulmonary to touch base about cpap  ?Also to get a carotid ultrasound at an imaging center  ? ?If you don't get a call in 2 weeks call us  ? ? ?Follow up in 3 months  ? ?I will find out what to do next about the cyst on your finger  ? ? ?

## 2021-12-21 ENCOUNTER — Encounter: Payer: Self-pay | Admitting: Family Medicine

## 2021-12-21 NOTE — Assessment & Plan Note (Signed)
BP: (!) 158/78 ? ?  ?Missed his 5 mg lisinopril today  ? ?Plan to inc to lisinopril 10 mg daily  ?Encouraged good diet/exercise habits and less processed foods  ?

## 2021-12-21 NOTE — Assessment & Plan Note (Signed)
Nl exam  ?Want to rule out vascular cause  ?Ref done for carotid doppler  ? ?

## 2021-12-21 NOTE — Assessment & Plan Note (Signed)
More fatigue lately Jerry [name] restful sleep  ?Ref to pulmonary for f/u  ?May need cpap pressure adjustment  ?

## 2021-12-21 NOTE — Assessment & Plan Note (Signed)
Lab Results  ?Component Value Date  ? HGBA1C 7.0 (A) 12/20/2021  ? ?Reviewed low glycemic diet  ?Increase metformin xr to 1000 mg daily /update if side effects  ?On ace ?Statin intolerant  ?F/u 3 months  ?Disc plan for exercise  ?

## 2021-12-21 NOTE — Assessment & Plan Note (Addendum)
R 5th finger/dorsal ?Pt routinely traumatizes it due to location  ?Since this is bothersome will refer to hand specialist  ? ?inst not to pop or try to remove this on his own  ? ?

## 2022-01-07 ENCOUNTER — Telehealth: Payer: Self-pay | Admitting: Family Medicine

## 2022-01-07 NOTE — Telephone Encounter (Signed)
Pt called asking about the status of his ultrasound for his neck and when he would hear back from whoever. Would like to speak to the nurse if possible. ? ?Callback Number: [phone] ?

## 2022-01-08 NOTE — Telephone Encounter (Signed)
FYI to PCP, please see Ashtyn's comments  ?

## 2022-01-11 ENCOUNTER — Ambulatory Visit (HOSPITAL_COMMUNITY)
Admission: RE | Admit: 2022-01-11 | Discharge: 2022-01-11 | Disposition: A | Discharge status: Home / Self Care | Payer: Medicare HMO | Source: Ambulatory Visit | Attending: Family Medicine | Admitting: Family Medicine

## 2022-01-11 ENCOUNTER — Ambulatory Visit: Payer: Medicare HMO | Admitting: Orthopedic Surgery

## 2022-01-11 DIAGNOSIS — M67449 Ganglion, unspecified hand: Secondary | ICD-10-CM | POA: Diagnosis not present

## 2022-01-11 DIAGNOSIS — H93A2 Pulsatile tinnitus, left ear: Secondary | ICD-10-CM

## 2022-01-11 NOTE — H&P (View-Only) (Signed)
? ?Office Visit Note ?  ?Patient: Jerry [name]           ?Date of Birth: 29-Sep-1955           ?MRN: [id number] ?Visit Date: 01/11/2022 ?             ?Requested by: Tower, Wynelle Fanny, MD ?[address] ?[place],  [place] [address] ?PCP: Abner Greenspan, MD ? ? ?Assessment & Plan: ?Visit Diagnoses:  ?1. Digital mucinous cyst of finger   ? ? ?Plan: We discussed the nature of digital mucous cysts as well as their diagnosis, prognosis, and both conservative and surgical treatment options.  He does have significant arthritis in this joint with a large dorsal osteophyte on my fluoroscopic exam today.  We discussed cyst excision with osteophyte debridement versus DIP arthrodesis.  The joint is not that painful to him at this point and he does not want to lose the range of motion at the DIP joint.  We discussed the risks of cyst excision with osteophyte debridement including bleeding, infection, mass recurrence, skin necrosis, damage to the germinal matrix or terminal tendon, and need for additional surgery.  After our discussion the patient would like to proceed with surgery.  A surgical date and time will be confirmed with the patient. ? ?Follow-Up Instructions: No follow-ups on file.  ? ?Orders:  ?No orders of the defined types were placed in this encounter. ? ?No orders of the defined types were placed in this encounter. ? ? ? ? Procedures: ?No procedures performed ? ? ?Clinical Data: ?No additional findings. ? ? ?Subjective: ?Chief Complaint  ?Patient presents with  ? Right Little Finger - Edema  ? ? 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  This was first noticed about 3 to 4 months ago.  He notes that the mass has enlarged since then.  The joint itself is not painful but he will occasionally bump the mass on things.  He does Neurosurgeon as a hobby and will occasionally hit this finger on tools or equipment.   He has no pain with range of motion of the DIP joint. ? ? ?Review of Systems ? ? ?Objective: ?Vital Signs: There were no vitals taken for this visit. ? ?Physical Exam ?Constitutional:   ?   Appearance: Normal appearance.  ?Cardiovascular:  ?   Rate and Rhythm: Normal rate.  ?   Pulses: Normal pulses.  ?Pulmonary:  ?   Effort: Pulmonary effort is normal.  ?Skin: ?   General: Skin is warm and dry.  ?   Capillary Refill: Capillary refill takes less than 2 seconds.  ?Neurological:  ?   Mental Status: He is alert.  ? ? ?Right Hand Exam  ? ?Tenderness  ?Right hand tenderness location: TTP around cyst at dorsal radial aspect of small finger DIP joint. ? ?Other  ?Erythema: absent ?Sensation: normal ?Pulse: present ? ?Comments:  Cyst at radial and dorsal aspect of small finger DIP joint.  Cyst filled with clear fluid.  No pain w/ passive or active ROM of small finger DIP joint.  ? ? ? ? ?Specialty Comments:  ?No specialty comments available. ? ?Imaging: ?No results found. ? ? ?PMFS History: ?Patient Active Problem List  ? Diagnosis Date Noted  ? Pulsatile tinnitus, left ear 12/20/2021  ? Digital mucinous cyst of finger 12/20/2021  ? Welcome to Medicare preventive visit  12/18/2020  ? Elevated TSH 12/18/2020  ? Hypokalemia 12/18/2020  ? Statin myopathy 12/18/2020  ? Prostatitis, chronic 02/06/2018  ? Family history of prostate cancer 02/06/2018  ? Type 2 diabetes mellitus without complication, with no history of insulin use ([place]) 01/07/2017  ? Former smoker 01/07/2017  ? Aortic atherosclerosis ([place]) 01/06/2017  ? Elevated PSA 04/24/2016  ? Routine general medical examination at a health care facility 08/05/2011  ? Prostate cancer screening 08/05/2011  ? TINNITUS 09/06/2009  ? COLONIC POLYPS, HX OF 01/16/2009  ? Obstructive sleep apnea 10/26/2008  ? Hyperlipidemia associated with type 2 diabetes mellitus ([place]) 03/11/2007  ? Essential hypertension 03/11/2007  ? Allergic rhinitis 03/11/2007  ? ASTHMA 03/11/2007  ? ?Past Medical  History:  ?Diagnosis Date  ? Allergic rhinitis, cause unspecified   ? Asthma   ? Diabetes mellitus without complication ([place])   ? Elbow fracture age 26 or 87  ? left  ? History of kidney stones   ? Hx of colonic polyps   ? Other and unspecified hyperlipidemia   ? Unspecified essential hypertension   ?  ?Family History  ?Problem Relation Age of Onset  ? Lung cancer Father   ? Diabetes Mother   ? Pancreatic cancer Paternal Grandfather   ? Colon cancer Neg Hx   ?  ?Past Surgical History:  ?Procedure Laterality Date  ? 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  ? surgery  ? CHOLECYSTECTOMY    ? KIDNEY STONE SURGERY    ? left leg fracture    ? ?Social History  ? ?Occupational History  ? Occupation: Theme park manager  ?Tobacco Use  ? Smoking status: Former  ?  Packs/day: 0.50  ?  Years: 3.00  ?  Pack years: 1.50  ?  Types: Cigarettes  ?  Quit date: 09/16/1972  ?  Years since quitting: 49.3  ? Smokeless tobacco: Never  ?Substance and Sexual Activity  ? Alcohol use: No  ?  Alcohol/week: 0.0 standard drinks  ? Drug use: No  ? Sexual activity: Not on file  ? ? ? ? ? ? ?

## 2022-01-11 NOTE — Progress Notes (Signed)
? ?Office Visit Note ?  ?Patient: Jerry [name]           ?Date of Birth: 03-05-1956           ?MRN: [id number] ?Visit Date: 01/11/2022 ?             ?Requested by: Tower, Wynelle Fanny, MD ?[address] ?[place],  [place] [address] ?PCP: Abner Greenspan, MD ? ? ?Assessment & Plan: ?Visit Diagnoses:  ?1. Digital mucinous cyst of finger   ? ? ?Plan: We discussed the nature of digital mucous cysts as well as their diagnosis, prognosis, and both conservative and surgical treatment options.  He does have significant arthritis in this joint with a large dorsal osteophyte on my fluoroscopic exam today.  We discussed cyst excision with osteophyte debridement versus DIP arthrodesis.  The joint is not that painful to him at this point and he does not want to lose the range of motion at the DIP joint.  We discussed the risks of cyst excision with osteophyte debridement including bleeding, infection, mass recurrence, skin necrosis, damage to the germinal matrix or terminal tendon, and need for additional surgery.  After our discussion the patient would like to proceed with surgery.  A surgical date and time will be confirmed with the patient. ? ?Follow-Up Instructions: No follow-ups on file.  ? ?Orders:  ?No orders of the defined types were placed in this encounter. ? ?No orders of the defined types were placed in this encounter. ? ? ? ? Procedures: ?No procedures performed ? ? ?Clinical Data: ?No additional findings. ? ? ?Subjective: ?Chief Complaint  ?Patient presents with  ? Right Little Finger - Edema  ? ? 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  This was first noticed about 3 to 4 months ago.  He notes that the mass has enlarged since then.  The joint itself is not painful but he will occasionally bump the mass on things.  He does Neurosurgeon as a hobby and will occasionally hit this finger on tools or equipment.   He has no pain with range of motion of the DIP joint. ? ? ?Review of Systems ? ? ?Objective: ?Vital Signs: There were no vitals taken for this visit. ? ?Physical Exam ?Constitutional:   ?   Appearance: Normal appearance.  ?Cardiovascular:  ?   Rate and Rhythm: Normal rate.  ?   Pulses: Normal pulses.  ?Pulmonary:  ?   Effort: Pulmonary effort is normal.  ?Skin: ?   General: Skin is warm and dry.  ?   Capillary Refill: Capillary refill takes less than 2 seconds.  ?Neurological:  ?   Mental Status: He is alert.  ? ? ?Right Hand Exam  ? ?Tenderness  ?Right hand tenderness location: TTP around cyst at dorsal radial aspect of small finger DIP joint. ? ?Other  ?Erythema: absent ?Sensation: normal ?Pulse: present ? ?Comments:  Cyst at radial and dorsal aspect of small finger DIP joint.  Cyst filled with clear fluid.  No pain w/ passive or active ROM of small finger DIP joint.  ? ? ? ? ?Specialty Comments:  ?No specialty comments available. ? ?Imaging: ?No results found. ? ? ?PMFS History: ?Patient Active Problem List  ? Diagnosis Date Noted  ? Pulsatile tinnitus, left ear 12/20/2021  ? Digital mucinous cyst of finger 12/20/2021  ? Welcome to Medicare preventive visit  12/18/2020  ? Elevated TSH 12/18/2020  ? Hypokalemia 12/18/2020  ? Statin myopathy 12/18/2020  ? Prostatitis, chronic 02/06/2018  ? Family history of prostate cancer 02/06/2018  ? Type 2 diabetes mellitus without complication, with no history of insulin use ([place]) 01/07/2017  ? Former smoker 01/07/2017  ? Aortic atherosclerosis ([place]) 01/06/2017  ? Elevated PSA 04/24/2016  ? Routine general medical examination at a health care facility 08/05/2011  ? Prostate cancer screening 08/05/2011  ? TINNITUS 09/06/2009  ? COLONIC POLYPS, HX OF 01/16/2009  ? Obstructive sleep apnea 10/26/2008  ? Hyperlipidemia associated with type 2 diabetes mellitus ([place]) 03/11/2007  ? Essential hypertension 03/11/2007  ? Allergic rhinitis 03/11/2007  ? ASTHMA 03/11/2007  ? ?Past Medical  History:  ?Diagnosis Date  ? Allergic rhinitis, cause unspecified   ? Asthma   ? Diabetes mellitus without complication ([place])   ? Elbow fracture age 54 or 55  ? left  ? History of kidney stones   ? Hx of colonic polyps   ? Other and unspecified hyperlipidemia   ? Unspecified essential hypertension   ?  ?Family History  ?Problem Relation Age of Onset  ? Lung cancer Father   ? Diabetes Mother   ? Pancreatic cancer Paternal Grandfather   ? Colon cancer Neg Hx   ?  ?Past Surgical History:  ?Procedure Laterality Date  ? 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  ? surgery  ? CHOLECYSTECTOMY    ? KIDNEY STONE SURGERY    ? left leg fracture    ? ?Social History  ? ?Occupational History  ? Occupation: Theme park manager  ?Tobacco Use  ? Smoking status: Former  ?  Packs/day: 0.50  ?  Years: 3.00  ?  Pack years: 1.50  ?  Types: Cigarettes  ?  Quit date: 09/16/1972  ?  Years since quitting: 49.3  ? Smokeless tobacco: Never  ?Substance and Sexual Activity  ? Alcohol use: No  ?  Alcohol/week: 0.0 standard drinks  ? Drug use: No  ? Sexual activity: Not on file  ? ? ? ? ? ? ?

## 2022-01-11 NOTE — Progress Notes (Signed)
Carotid duplex has been completed.  ? ?Preliminary results in CV Proc.  ? ?Harce Volden Empress Newmann ?01/11/2022 1:06 PM    ?

## 2022-01-17 ENCOUNTER — Ambulatory Visit: Payer: Medicare HMO

## 2022-01-18 ENCOUNTER — Other Ambulatory Visit: Payer: Self-pay

## 2022-01-18 ENCOUNTER — Encounter (HOSPITAL_BASED_OUTPATIENT_CLINIC_OR_DEPARTMENT_OTHER): Payer: Self-pay | Admitting: Orthopedic Surgery

## 2022-01-22 ENCOUNTER — Other Ambulatory Visit: Payer: Self-pay

## 2022-01-22 ENCOUNTER — Telehealth: Payer: Self-pay

## 2022-01-22 ENCOUNTER — Ambulatory Visit (INDEPENDENT_AMBULATORY_CARE_PROVIDER_SITE_OTHER): Payer: Medicare HMO | Admitting: *Deleted

## 2022-01-22 DIAGNOSIS — Z1211 Encounter for screening for malignant neoplasm of colon: Secondary | ICD-10-CM

## 2022-01-22 DIAGNOSIS — Z86010 Personal history of colonic polyps: Secondary | ICD-10-CM

## 2022-01-22 DIAGNOSIS — Z0000 Encounter for general adult medical examination without abnormal findings: Secondary | ICD-10-CM

## 2022-01-22 MED ORDER — NA SULFATE-K SULFATE-MG SULF 17.5-3.13-1.6 GM/177ML PO SOLN: 1.0000 | Freq: Once | ORAL | 0 refills | Status: AC

## 2022-01-22 NOTE — Patient Instructions (Signed)
Jerry [name] , ?Thank you for taking time to come for your Medicare Wellness Visit. I appreciate your ongoing commitment to your health goals. Please review the following plan we discussed and let me know if I can assist you in the future.  ? ?Screening recommendations/referrals: ?Colonoscopy: Education provided ?Recommended yearly ophthalmology/optometry visit for glaucoma screening and checkup ?Recommended yearly dental visit for hygiene and checkup ? ?Vaccinations: ?Influenza vaccine: education provided ?Pneumococcal vaccine: Education provided ?Tdap vaccine: up to date ?Shingles vaccine: education provided   ? ?Advanced directives: education provided ? ?Conditions/risks identified:  ? ?Next appointment: 03-25-2022 @ 8:30  Tower ? ?Preventive Care 67 Years and Older, Male ?Preventive care refers to lifestyle choices and visits with your health care provider that can promote health and wellness. ?What does preventive care include? ?A yearly physical exam. This is also called an annual well check. ?Dental exams once or twice a year. ?Routine eye exams. Ask your health care provider how often you should have your eyes checked. ?Personal lifestyle choices, including: ?Daily care of your teeth and gums. ?Regular physical activity. ?Eating a healthy diet. ?Avoiding tobacco and drug use. ?Limiting alcohol use. ?Practicing safe sex. ?Taking low doses of aspirin every day. ?Taking vitamin and mineral supplements as recommended by your health care provider. ?What happens during an annual well check? ?The services and screenings done by your health care provider during your annual well check will depend on your age, overall health, lifestyle risk factors, and family history of disease. ?Counseling  ?Your health care provider may ask you questions about your: ?Alcohol use. ?Tobacco use. ?Drug use. ?Emotional well-being. ?Home and relationship well-being. ?Sexual activity. ?Eating habits. ?History of falls. ?Memory and ability  to understand (cognition). ?Work and work Statistician. ?Screening  ?You may have the following tests or measurements: ?Height, weight, and BMI. ?Blood pressure. ?Lipid and cholesterol levels. These may be checked every 5 years, or more frequently if you are over 40 years old. ?Skin check. ?Lung cancer screening. You may have this screening every year starting at age 16 if you have a 30-pack-year history of smoking and currently smoke or have quit within the past 15 years. ?Fecal occult blood test (FOBT) of the stool. You may have this test every year starting at age 58. ?Flexible sigmoidoscopy or colonoscopy. You may have a sigmoidoscopy every 5 years or a colonoscopy every 10 years starting at age 29. ?Prostate cancer screening. Recommendations will vary depending on your family history and other risks. ?Hepatitis C blood test. ?Hepatitis B blood test. ?Sexually transmitted disease (STD) testing. ?Diabetes screening. This is done by checking your blood sugar (glucose) after you have not eaten for a while (fasting). You may have this done every 1-3 years. ?Abdominal aortic aneurysm (AAA) screening. You may need this if you are a current or former smoker. ?Osteoporosis. You may be screened starting at age 71 if you are at high risk. ?Talk with your health care provider about your test results, treatment options, and if necessary, the need for more tests. ?Vaccines  ?Your health care provider may recommend certain vaccines, such as: ?Influenza vaccine. This is recommended every year. ?Tetanus, diphtheria, and acellular pertussis (Tdap, Td) vaccine. You may need a Td booster every 10 years. ?Zoster vaccine. You may need this after age 70. ?Pneumococcal 13-valent conjugate (PCV13) vaccine. One dose is recommended after age 61. ?Pneumococcal polysaccharide (PPSV23) vaccine. One dose is recommended after age 49. ?Talk to your health care provider about which screenings and vaccines you need  and how often you need  them. ?This information is not intended to replace advice given to you by your health care provider. Make sure you discuss any questions you have with your health care provider. ?Document Released: 09/29/2015 Document Revised: 05/22/2016 Document Reviewed: 07/04/2015 ?Elsevier Interactive Patient Education ? 2017 [place]. ? ?Fall Prevention in the Home ?Falls can cause injuries. They can happen to people of all ages. There are many things you can do to make your home safe and to help prevent falls. ?What can I do on the outside of my home? ?Regularly fix the edges of walkways and driveways and fix any cracks. ?Remove anything that might make you trip as you walk through a door, such as a raised step or threshold. ?Trim any bushes or trees on the path to your home. ?Use bright outdoor lighting. ?Clear any walking paths of anything that might make someone trip, such as rocks or tools. ?Regularly check to see if handrails are loose or broken. Make sure that both sides of any steps have handrails. ?Any raised decks and porches should have guardrails on the edges. ?Have any leaves, snow, or ice cleared regularly. ?Use sand or salt on walking paths during winter. ?Clean up any spills in your garage right away. This includes oil or grease spills. ?What can I do in the bathroom? ?Use night lights. ?Install grab bars by the toilet and in the tub and shower. Do not use towel bars as grab bars. ?Use non-skid mats or decals in the tub or shower. ?If you need to sit down in the shower, use a plastic, non-slip stool. ?Keep the floor dry. Clean up any water that spills on the floor as soon as it happens. ?Remove soap buildup in the tub or shower regularly. ?Attach bath mats securely with double-sided non-slip rug tape. ?Do not have throw rugs and other things on the floor that can make you trip. ?What can I do in the bedroom? ?Use night lights. ?Make sure that you have a light by your bed that is easy to reach. ?Do not use  any sheets or blankets that are too big for your bed. They should not hang down onto the floor. ?Have a firm chair that has side arms. You can use this for support while you get dressed. ?Do not have throw rugs and other things on the floor that can make you trip. ?What can I do in the kitchen? ?Clean up any spills right away. ?Avoid walking on wet floors. ?Keep items that you use a lot in easy-to-reach places. ?If you need to reach something above you, use a strong step stool that has a grab bar. ?Keep electrical cords out of the way. ?Do not use floor polish or wax that makes floors slippery. If you must use wax, use non-skid floor wax. ?Do not have throw rugs and other things on the floor that can make you trip. ?What can I do with my stairs? ?Do not leave any items on the stairs. ?Make sure that there are handrails on both sides of the stairs and use them. Fix handrails that are broken or loose. Make sure that handrails are as long as the stairways. ?Check any carpeting to make sure that it is firmly attached to the stairs. Fix any carpet that is loose or worn. ?Avoid having throw rugs at the top or bottom of the stairs. If you do have throw rugs, attach them to the floor with carpet tape. ?Make sure that you  have a light switch at the top of the stairs and the bottom of the stairs. If you do not have them, ask someone to add them for you. ?What else can I do to help prevent falls? ?Wear shoes that: ?Do not have high heels. ?Have rubber bottoms. ?Are comfortable and fit you well. ?Are closed at the toe. Do not wear sandals. ?If you use a stepladder: ?Make sure that it is fully opened. Do not climb a closed stepladder. ?Make sure that both sides of the stepladder are locked into place. ?Ask someone to hold it for you, if possible. ?Clearly mark and make sure that you can see: ?Any grab bars or handrails. ?First and last steps. ?Where the edge of each step is. ?Use tools that help you move around (mobility aids)  if they are needed. These include: ?Canes. ?Walkers. ?Scooters. ?Crutches. ?Turn on the lights when you go into a dark area. Replace any light bulbs as soon as they burn out. ?Set up your furniture so you have a

## 2022-01-22 NOTE — Telephone Encounter (Signed)
Gastroenterology Pre-Procedure Review ? ?Request Date: 03/11/22 ?Requesting Physician: Dr. Vicente Males ? ?PATIENT REVIEW QUESTIONS: The patient responded to the following health history questions as indicated:   ? ?1. Are you having any GI issues? no ?2. Do you have a personal history of Polyps? yes (2016 performed at [facility] by Dr. Henrene Pastor) ?3. Do you have a family history of Colon Cancer or Polyps? no ?4. Diabetes Mellitus? yes (type 2) ?5. Joint replacements in the past 12 months?no ?6. Major health problems in the past 3 months?no ?7. Any artificial heart valves, MVP, or defibrillator?no ?   ?MEDICATIONS & ALLERGIES:    ?Patient reports the following regarding taking any anticoagulation/antiplatelet therapy:   ?Plavix, Coumadin, Eliquis, Xarelto, Lovenox, Pradaxa, Brilinta, or Effient? no ?Aspirin? no ? ?Patient confirms/reports the following medications:  ?Current Outpatient Medications  ?Medication Sig Dispense Refill  ? amLODipine (NORVASC) 10 MG tablet TAKE 1 TABLET BY MOUTH EVERY DAY 90 tablet 1  ? Ascorbic Acid (VITAMIN C) 100 MG tablet Take 100 mg by mouth daily.    ? budesonide (RHINOCORT AQUA) 32 MCG/ACT nasal spray Place 2 sprays into both nostrils daily. 5 mL 11  ? ELDERBERRY PO Take 1 tablet by mouth daily.    ? fexofenadine (ALLEGRA) 180 MG tablet Take 180 mg by mouth daily.    ? lisinopril (ZESTRIL) 10 MG tablet Take 1 tablet (10 mg total) by mouth daily. 90 tablet 3  ? metFORMIN (GLUCOPHAGE-XR) 500 MG 24 hr tablet Take 2 tablets (1,000 mg total) by mouth daily with breakfast. 180 tablet 1  ? Multiple Vitamin (MULTIVITAMIN) capsule Take 1 capsule by mouth daily.    ? Multiple Vitamins-Minerals (ZINC PO) Take 1 tablet by mouth daily.    ? OVER THE COUNTER MEDICATION Take 1 tablet by mouth daily. Nugenix    ? tamsulosin (FLOMAX) 0.4 MG CAPS capsule Take 0.4 mg by mouth.    ? Turmeric (QC TUMERIC COMPLEX PO) Take by mouth.    ? ?No current facility-administered medications for this visit.  ? ? ?Patient  confirms/reports the following allergies:  ?Allergies  ?Allergen Reactions  ? Augmentin [Amoxicillin-Pot Clavulanate] Nausea And Vomiting  ? Crestor [Rosuvastatin Calcium]   ?  Body pain   ? Fluticasone Propionate   ?  REACTION: nosebleeds  ? ? ?No orders of the defined types were placed in this encounter. ? ? ?AUTHORIZATION INFORMATION ?Primary Insurance: ?1D#: ?Group #: ? ?Secondary Insurance: ?1D#: ?Group #: ? ?SCHEDULE INFORMATION: ?Date: 06/26 ?Time: ?Location: [facility] ?

## 2022-01-22 NOTE — Progress Notes (Signed)
Subjective:   Jerry [name] is a 66 y.o. male who presents for an Initial Medicare Annual Wellness Visit.  I connected with  Crandall P Boyde on 01/22/22 by a telephone enabled telemedicine application and verified that I am speaking with the correct person using two identifiers.   I discussed the limitations of evaluation and management by telemedicine. The patient expressed understanding and agreed to proceed.  Patient location: home  Provider location: tele-health -home    Review of Systems     Cardiac Risk Factors include: advanced age (>86men, >49 women);diabetes mellitus;male gender;obesity (BMI >30kg/m2);hypertension     Objective:    Today's Vitals   There is no height or weight on file to calculate BMI.     01/22/2022    9:41 AM 01/18/2022    1:41 PM 06/21/2021    5:06 PM 05/22/2021   10:30 AM 08/22/2019    9:56 PM 12/17/2016    9:18 PM 07/10/2015   10:52 AM  Advanced Directives  Does Patient Have a Medical Advance Directive? No No No No No No No  Would patient like information on creating a medical advance directive?  No - Patient declined Yes (ED - Information included in AVS) No - Patient declined  No - Patient declined No - patient declined information    Current Medications (verified) Outpatient Encounter Medications as of 01/22/2022  Medication Sig   amLODipine (NORVASC) 10 MG tablet TAKE 1 TABLET BY MOUTH EVERY DAY   Ascorbic Acid (VITAMIN C) 100 MG tablet Take 100 mg by mouth daily.   budesonide (RHINOCORT AQUA) 32 MCG/ACT nasal spray Place 2 sprays into both nostrils daily.   ELDERBERRY PO Take 1 tablet by mouth daily.   fexofenadine (ALLEGRA) 180 MG tablet Take 180 mg by mouth daily.   lisinopril (ZESTRIL) 10 MG tablet Take 1 tablet (10 mg total) by mouth daily.   metFORMIN (GLUCOPHAGE-XR) 500 MG 24 hr tablet Take 2 tablets (1,000 mg total) by mouth daily with breakfast.   Multiple Vitamin (MULTIVITAMIN) capsule Take 1 capsule by mouth daily.   Multiple  Vitamins-Minerals (ZINC PO) Take 1 tablet by mouth daily.   OVER THE COUNTER MEDICATION Take 1 tablet by mouth daily. Nugenix   tamsulosin (FLOMAX) 0.4 MG CAPS capsule Take 0.4 mg by mouth.   Turmeric (QC TUMERIC COMPLEX PO) Take by mouth.   No facility-administered encounter medications on file as of 01/22/2022.    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   surgery   CHOLECYSTECTOMY     KIDNEY STONE SURGERY     left leg fracture     TONSILLECTOMY AND ADENOIDECTOMY     2000   Family History  Problem Relation Age of Onset   Lung cancer Father    Diabetes Mother    Pancreatic cancer Paternal Grandfather    Colon cancer Neg Hx    Social History   Socioeconomic History   Marital status: Married    Spouse name: Not on file  Number of children: 3   Years of education: Not on file   Highest education level: Not on file  Occupational History   Occupation: Pastor  Tobacco Use   Smoking status: Former    Packs/day: 0.50    Years: 3.00    Pack years: 1.50    Types: Cigarettes    Quit date: 09/16/1972    Years since quitting: 49.3   Smokeless tobacco: Never  Substance and Sexual Activity   Alcohol use: No    Alcohol/week: 0.0 standard drinks   Drug use: No   Sexual activity: Not on file  Other Topics Concern   Not on file  Social History Narrative   Not on file   Social Determinants of Health   Financial Resource Strain: Low Risk    Difficulty of Paying Living Expenses: Not hard at all   Food Insecurity: No Food Insecurity   Worried About Programme researcher, broadcasting/film/video in the Last Year: Never true   Ran Out of Food in the Last Year: Never true  Transportation Needs: No Transportation Needs   Lack of Transportation (Medical): No   Lack of Transportation (Non-Medical): No  Physical Activity: Insufficiently Active   Days of Exercise per Week: 3 days   Minutes of Exercise per Session: 40 min  Stress: No Stress Concern Present   Feeling of Stress : Not at all  Social Connections: Socially Integrated   Frequency of Communication with Friends and Family: More than three times a week   Frequency of Social Gatherings with Friends and Family: More than three times a week   Attends Religious Services: More than 4 times per year   Active Member of [employer] or Organizations: Yes   Attends Engineer, structural: More than 4 times per year   Marital Status: Married    Tobacco Counseling Counseling given: Not Answered   Clinical Intake:  Pre-visit preparation completed: Yes  Pain : No/denies pain     Nutritional Risks: None Diabetes: Yes CBG done?: No Did pt. bring in CBG monitor from home?: No  How often do you need to have someone help you when you read instructions, pamphlets, or other written materials from your doctor or pharmacy?: 1 - Never  Diabetic?   Nutrition Risk Assessment:  Has the patient had any N/V/D within the last 2 months?  No  Does the patient have any non-healing wounds?  No  Has the patient had any unintentional weight loss or weight gain?  No   Diabetes:  Is the patient diabetic?  Yes  If diabetic, was a CBG obtained today?  No  Did the patient bring in their glucometer from home?  No  How often do you monitor your CBG's? Does not check.   Financial Strains and Diabetes Management:  Are you having any financial strains with the device, your supplies or your medication? No .  Does the patient want to be seen by Chronic Care Management for  management of their diabetes?  No  Would the patient like to be referred to a Nutritionist or for Diabetic Management?  No   Diabetic Exams:  Diabetic Eye Exam: Pt has been advised about the importance in completing this exam.   Diabetic Foot Exam: . Pt has been advised about the importance in completing this exam.  Interpreter Needed?: No  Information entered by :: Remi Haggard LPN   Activities of Daily Living    01/22/2022    9:41 AM 06/18/2021    9:02  AM  In your present state of health, do you have any difficulty performing the following activities:  Hearing? 0 0  Vision? 0 0  Difficulty concentrating or making decisions? 0 0  Walking or climbing stairs? 0 0  Dressing or bathing? 0 0  Doing errands, shopping? 0 0  Preparing Food and eating ? N   Using the Toilet? N   In the past six months, have you accidently leaked urine? N   Do you have problems with loss of bowel control? N   Managing your Medications? N   Managing your Finances? N   Housekeeping or managing your Housekeeping? N     Patient Care Team: Tower, Audrie Gallus, MD as PCP - General Mariah Milling, Tollie Pizza, MD as Consulting Physician (Cardiology)  Indicate any recent Medical Services you may have received from other than [facility] providers in the past year (date may be approximate).     Assessment:   This is a routine wellness examination for Early.  Hearing/Vision screen Hearing Screening - Comments:: No trouble hearing Vision Screening - Comments:: Up to date Unsure of name  Dietary issues and exercise activities discussed: Current Exercise Habits: Home exercise routine, Time (Minutes): 30, Frequency (Times/Week): 3, Weekly Exercise (Minutes/Week): 90, Exercise limited by: None identified   Goals Addressed             This Visit's Progress    Increase physical activity         Depression Screen    01/22/2022    9:35 AM 12/18/2020    9:30 AM 11/12/2019    4:02 PM 07/27/2018    9:07 AM 07/04/2017    10:07 AM  PHQ 2/9 Scores  PHQ - 2 Score 0 0 0 0 0  PHQ- 9 Score   1      Fall Risk    01/22/2022    9:32 AM 12/18/2020    9:30 AM 07/04/2017   10:07 AM  Fall Risk   Falls in the past year? 0 0 No  Number falls in past yr: 0    Injury with Fall? 0    Follow up Falls evaluation completed;Education provided;Falls prevention discussed Falls evaluation completed     FALL RISK PREVENTION PERTAINING TO THE HOME:  Any stairs in or around the home? Yes  If so, are there any without handrails? No  Home free of loose throw rugs in walkways, pet beds, electrical cords, etc? Yes  Adequate lighting in your home to reduce risk of falls? Yes   ASSISTIVE DEVICES UTILIZED TO PREVENT FALLS:  Life alert? No  Use of a cane, walker or w/c? No  Grab bars in the bathroom? No  Shower chair or bench in shower? No  Elevated toilet seat or a handicapped toilet? No   TIMED UP AND GO:  Was the test performed? No .    Cognitive Function:        01/22/2022    9:33 AM  6CIT Screen  What Year? 0 points  What month? 0 points  What time? 0 points  Count back from 20 0 points  Months in reverse 0 points  Repeat phrase 0 points  Total Score 0 points    Immunizations Immunization History  Administered Date(s) Administered   Influenza Split 08/05/2011, 10/23/2012, 09/04/2015   Influenza Whole 07/10/2005, 06/21/2008   Influenza,inj,Quad PF,6+ Mos 07/04/2017, 07/27/2018   Pneumococcal Polysaccharide-23 08/14/2005   Td 01/14/2002   Tdap 08/05/2011, 05/07/2019, 06/21/2021    TDAP status: Up  to date  Flu Vaccine status: Due, Education has been provided regarding the importance of this vaccine. Advised may receive this vaccine at local pharmacy or Health Dept. Aware to provide a copy of the vaccination record if obtained from local pharmacy or Health Dept. Verbalized acceptance and understanding.  Pneumococcal vaccine status: Due, Education has been provided regarding the importance of this vaccine.  Advised may receive this vaccine at local pharmacy or Health Dept. Aware to provide a copy of the vaccination record if obtained from local pharmacy or Health Dept. Verbalized acceptance and understanding.  Covid-19 vaccine status: Information provided on how to obtain vaccines.   Qualifies for Shingles Vaccine? Yes   Zostavax completed No   Shingrix Completed?: No.    Education has been provided regarding the importance of this vaccine. Patient has been advised to call insurance company to determine out of pocket expense if they have not yet received this vaccine. Advised may also receive vaccine at local pharmacy or Health Dept. Verbalized acceptance and understanding.  Screening Tests Health Maintenance  Topic Date Due   Pneumonia Vaccine 73+ Years old (2 - PCV) 08/14/2006   COLONOSCOPY (Pts 45-102yrs Insurance coverage will need to be confirmed)  07/09/2020   COVID-19 Vaccine (1) 02/07/2022 (Originally 01/26/1957)   Zoster Vaccines- Shingrix (1 of 2) 04/24/2022 (Originally 07/28/2005)   Hepatitis C Screening  11/08/2023 (Originally 07/28/1974)   INFLUENZA VACCINE  04/16/2022   FOOT EXAM  06/18/2022   HEMOGLOBIN A1C  06/21/2022   OPHTHALMOLOGY EXAM  07/30/2022   TETANUS/TDAP  06/22/2031   HPV VACCINES  Aged Out    Health Maintenance  Health Maintenance Due  Topic Date Due   Pneumonia Vaccine 1+ Years old (2 - PCV) 08/14/2006   COLONOSCOPY (Pts 45-50yrs Insurance coverage will need to be confirmed)  07/09/2020    Colorectal cancer screening: Referral to GI placed  . Pt aware the office will call re: appt.  Lung Cancer Screening: (Low Dose CT Chest recommended if Age 85-80 years, 30 pack-year currently smoking OR have quit w/in 15years.) does not qualify.   Lung Cancer Screening Referral:   Additional Screening:  Hepatitis C Screening: does qualify; Completed   Vision Screening: Recommended annual ophthalmology exams for early detection of glaucoma and other disorders of the  eye. Is the patient up to date with their annual eye exam?  Yes  Who is the provider or what is the name of the office in which the patient attends annual eye exams? Unsure of name If pt is not established with a provider, would they like to be referred to a provider to establish care? No .   Dental Screening: Recommended annual dental exams for proper oral hygiene  Community Resource Referral / Chronic Care Management: CRR required this visit?  No   CCM required this visit?  No      Plan:     I have personally reviewed and noted the following in the patient's chart:   Medical and social history Use of alcohol, tobacco or illicit drugs  Current medications and supplements including opioid prescriptions. Patient is not currently taking opioid prescriptions. Functional ability and status Nutritional status Physical activity Advanced directives List of other physicians Hospitalizations, surgeries, and ER visits in previous 12 months Vitals Screenings to include cognitive, depression, and falls Referrals and appointments  In addition, I have reviewed and discussed with patient certain preventive protocols, quality metrics, and best practice recommendations. A written personalized care plan for preventive services as well as  general preventive health recommendations were provided to patient.     Remi Haggard, LPN   04/19/6961   Nurse Notes:

## 2022-01-23 ENCOUNTER — Encounter: Payer: Self-pay | Admitting: Pulmonary Disease

## 2022-01-23 ENCOUNTER — Ambulatory Visit (INDEPENDENT_AMBULATORY_CARE_PROVIDER_SITE_OTHER): Payer: Medicare HMO | Admitting: Pulmonary Disease

## 2022-01-23 VITALS — BP 140/60 | HR 58 | Ht 69.5 in | Wt 193.6 lb

## 2022-01-23 DIAGNOSIS — G4733 Obstructive sleep apnea (adult) (pediatric): Secondary | ICD-10-CM

## 2022-01-23 DIAGNOSIS — I10 Essential (primary) hypertension: Secondary | ICD-10-CM | POA: Diagnosis not present

## 2022-01-23 NOTE — Assessment & Plan Note (Signed)
Controlled on 2 medications. ?Close association between OSA and hypertension was discussed. ?Systolic blood pressure slight high today but he is asymptomatic ?

## 2022-01-23 NOTE — Progress Notes (Signed)
? ?Subjective:  ? ? Patient ID: Jerry [name], male    DOB: 1956/02/24, 66 y.o.   MRN: [id number] ? ?HPI ?67 year old presents to reestablish care for OSA. ?Accompanied by his wife Opal Sidles who corroborates history ? ?PMH -hypertension ?Diabetes ?COVID infection 05/2020 -increased seasonal allergies since ? ?OSA  was first diagnosed in 2001 with very severe OSA, with an AHI greater than 100 events per hour. He lost considerable weight since then. But repeat home study still showed severe OSA with AHI of 35/hour. ? ?He was last seen in 2016 when we provided him with a new machine with auto settings 5 to 20 cm.  He is very compliant and does not miss a single night.  He is settled down with nasal pillows. ?For sleepiness score is 12 ?He reports some sleepiness while watching TV or sitting and reading. ?Bedtime is between 10 and 11 PM, sleep latency is minimal, he sleeps on his left side with 2 pillows, reports multiple nocturnal awakenings and out of bed by 5 AM feeling rested without dryness of mouth or headaches. ?His weight has not changed within the past 6 years ?There is no history suggestive of cataplexy, sleep paralysis or parasomnias ? ? ?Significant tests/ events reviewed ? ?NPSG 2001 AHI 124/h ?UPPP 2001 ?Lost wt ....HST 11/2014 35/h ? ? ?Past Medical History:  ?Diagnosis Date  ? Allergic rhinitis, cause unspecified   ? Asthma   ? Diabetes mellitus without complication ([place])   ? Elbow fracture age 36 or 87  ? left  ? History of kidney stones   ? Hx of colonic polyps   ? Other and unspecified hyperlipidemia   ? Unspecified essential hypertension   ? ? ?Past Surgical History:  ?Procedure Laterality Date  ? 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  ? surgery  ? CHOLECYSTECTOMY    ? KIDNEY STONE SURGERY    ? left leg fracture    ? TONSILLECTOMY AND ADENOIDECTOMY    ? 2000  ? ? ?Allergies  ?Allergen Reactions  ? Augmentin [Amoxicillin-Pot Clavulanate] Nausea And Vomiting  ? Crestor [Rosuvastatin Calcium]   ?  Body pain    ? Fluticasone Propionate   ?  REACTION: nosebleeds  ? ? ?Social History  ? ?Socioeconomic History  ? Marital status: Married  ?  Spouse name: Not on file  ? Number of children: 3  ? Years of education: Not on file  ? Highest education level: Not on file  ?Occupational History  ? Occupation: Theme park manager  ?Tobacco Use  ? Smoking status: Former  ?  Packs/day: 0.50  ?  Years: 3.00  ?  Pack years: 1.50  ?  Types: Cigarettes  ?  Quit date: 09/16/1972  ?  Years since quitting: 49.3  ? Smokeless tobacco: Never  ?Substance and Sexual Activity  ? Alcohol use: No  ?  Alcohol/week: 0.0 standard drinks  ? Drug use: No  ? Sexual activity: Not on file  ?Other Topics Concern  ? Not on file  ?Social History Narrative  ? Not on file  ? ?Social Determinants of Health  ? ?Financial Resource Strain: Low Risk   ? Difficulty of Paying Living Expenses: Not hard at all  ?Food Insecurity: No Food Insecurity  ? Worried About Charity fundraiser in the Last Year: Never true  ? Ran Out of Food in the Last Year: Never true  ?Transportation Needs: No Transportation Needs  ? Lack of Transportation (Medical): No  ? Lack of Transportation (Non-Medical): No  ?  Physical Activity: Insufficiently Active  ? Days of Exercise per Week: 3 days  ? Minutes of Exercise per Session: 40 min  ?Stress: No Stress Concern Present  ? Feeling of Stress : Not at all  ?Social Connections: Socially Integrated  ? Frequency of Communication with Friends and Family: More than three times a week  ? Frequency of Social Gatherings with Friends and Family: More than three times a week  ? Attends Religious Services: More than 4 times per year  ? Active Member of Clubs or Organizations: Yes  ? Attends Archivist Meetings: More than 4 times per year  ? Marital Status: Married  ?Intimate Partner Violence: Not At Risk  ? Fear of Current or Ex-Partner: No  ? Emotionally Abused: No  ? Physically Abused: No  ? Sexually Abused: No  ? ? ?Family History  ?Problem Relation Age of Onset   ? Lung cancer Father   ? Diabetes Mother   ? Pancreatic cancer Paternal Grandfather   ? Colon cancer Neg Hx   ? ? ? ? ?Review of Systems ?Constitutional: negative for anorexia, fevers and sweats  ?Eyes: negative for irritation, redness and visual disturbance  ?Ears, nose, mouth, throat, and face: negative for earaches, epistaxis, nasal congestion and sore throat  ?Respiratory: negative for cough, dyspnea on exertion, sputum and wheezing  ?Cardiovascular: negative for chest pain, dyspnea, lower extremity edema, orthopnea, palpitations and syncope  ?Gastrointestinal: negative for abdominal pain, constipation, diarrhea, melena, nausea and vomiting  ?Genitourinary:negative for dysuria, frequency and hematuria  ?Hematologic/lymphatic: negative for bleeding, easy bruising and lymphadenopathy  ?Musculoskeletal:negative for arthralgias, muscle weakness and stiff joints  ?Neurological: negative for coordination problems, gait problems, headaches and weakness  ?Endocrine: negative for diabetic symptoms including polydipsia, polyuria and weight loss ? ?   ?Objective:  ? Physical Exam ? ?Gen. Pleasant, well-nourished, in no distress, normal affect ?ENT - no pallor,icterus, no post nasal drip, s/p UPPP ?Neck: No JVD, no thyromegaly, no carotid bruits ?Lungs: no use of accessory muscles, no dullness to percussion, clear without rales or rhonchi  ?Cardiovascular: Rhythm regular, heart sounds  normal, no murmurs or gallops, no peripheral edema ?Abdomen: soft and non-tender, no hepatosplenomegaly, BS normal. ?Musculoskeletal: No deformities, no cyanosis or clubbing ?Neuro:  alert, non focal ? ? ? ?   ?Assessment & Plan:  ? ? ?

## 2022-01-23 NOTE — Patient Instructions (Signed)
?  X prescription for auto CPAP 8 to 12 cm will be [facility] to [facility] ?

## 2022-01-23 NOTE — Assessment & Plan Note (Signed)
CPAP download was reviewed which shows residual AHI of 7/hour on auto settings 5 to 20 cm with average pressure of 10 cm and maximum pressure of 11 cm. ?He is very compliant has not missed a single night, average usage is about 6 hours per night on some nights AHI is as high as 20 ?He would be eligible for a new machine ?We will provide him with a new auto CPAP machine and tighten his settings to auto 8 to 12 cm.  We will check a download on the settings and tweak as needed ? ?He is very compliant and CPAP is certainly helped improve his daytime somnolence and fatigue ?

## 2022-01-28 ENCOUNTER — Encounter (HOSPITAL_BASED_OUTPATIENT_CLINIC_OR_DEPARTMENT_OTHER): Payer: Self-pay | Admitting: Orthopedic Surgery

## 2022-01-28 ENCOUNTER — Encounter (HOSPITAL_BASED_OUTPATIENT_CLINIC_OR_DEPARTMENT_OTHER)
Admission: RE | Disposition: A | Discharge status: Home / Self Care | Payer: Self-pay | Source: Home / Self Care | Attending: Orthopedic Surgery

## 2022-01-28 ENCOUNTER — Ambulatory Visit (HOSPITAL_BASED_OUTPATIENT_CLINIC_OR_DEPARTMENT_OTHER)
Admission: RE | Admit: 2022-01-28 | Discharge: 2022-01-28 | Disposition: A | Discharge status: Home / Self Care | Payer: Medicare HMO | Attending: Orthopedic Surgery | Admitting: Orthopedic Surgery

## 2022-01-28 ENCOUNTER — Ambulatory Visit (HOSPITAL_BASED_OUTPATIENT_CLINIC_OR_DEPARTMENT_OTHER): Payer: Medicare HMO | Admitting: Anesthesiology

## 2022-01-28 ENCOUNTER — Other Ambulatory Visit: Payer: Self-pay

## 2022-01-28 DIAGNOSIS — M67441 Ganglion, right hand: Secondary | ICD-10-CM

## 2022-01-28 DIAGNOSIS — M67449 Ganglion, unspecified hand: Secondary | ICD-10-CM

## 2022-01-28 DIAGNOSIS — Z7984 Long term (current) use of oral hypoglycemic drugs: Secondary | ICD-10-CM | POA: Diagnosis not present

## 2022-01-28 DIAGNOSIS — M67843 Other specified disorders of tendon, right hand: Secondary | ICD-10-CM

## 2022-01-28 DIAGNOSIS — I10 Essential (primary) hypertension: Secondary | ICD-10-CM

## 2022-01-28 DIAGNOSIS — M25741 Osteophyte, right hand: Secondary | ICD-10-CM | POA: Insufficient documentation

## 2022-01-28 DIAGNOSIS — G4733 Obstructive sleep apnea (adult) (pediatric): Secondary | ICD-10-CM | POA: Diagnosis not present

## 2022-01-28 DIAGNOSIS — E119 Type 2 diabetes mellitus without complications: Secondary | ICD-10-CM

## 2022-01-28 DIAGNOSIS — Z9989 Dependence on other enabling machines and devices: Secondary | ICD-10-CM | POA: Diagnosis not present

## 2022-01-28 HISTORY — DX: Sleep apnea, unspecified: G47.30

## 2022-01-28 HISTORY — PX: GANGLION CYST EXCISION: SHX1691

## 2022-01-28 LAB — BASIC METABOLIC PANEL
Anion gap: 7 (ref 5–15)
BUN: 17 mg/dL (ref 8–23)
CO2: 25 mmol/L (ref 22–32)
Calcium: 9.3 mg/dL (ref 8.9–10.3)
Chloride: 108 mmol/L (ref 98–111)
Creatinine, Ser: 1.05 mg/dL (ref 0.61–1.24)
GFR, Estimated: >60 mL/min (ref >60)
Glucose, Bld: 131 mg/dL — ABNORMAL HIGH (ref 70–99)
Potassium: 3.8 mmol/L (ref 3.5–5.1)
Sodium: 140 mmol/L (ref 135–145)

## 2022-01-28 LAB — GLUCOSE, CAPILLARY
Glucose-Capillary: 86 mg/dL (ref 70–99)
Glucose-Capillary: 121 mg/dL — ABNORMAL HIGH (ref 70–99)

## 2022-01-28 SURGERY — EXCISION, GANGLION CYST, WRIST
Anesthesia: Monitor Anesthesia Care | Site: Finger | Laterality: Right

## 2022-01-28 MED ORDER — LIDOCAINE 2% (20 MG/ML) 5 ML SYRINGE
INTRAMUSCULAR | Status: DC | PRN
  Administered 2022-01-28: 40 mg via INTRAVENOUS

## 2022-01-28 MED ORDER — OXYCODONE HCL 5 MG/5ML PO SOLN: 5.0000 mg | Freq: Once | ORAL | Status: DC | PRN

## 2022-01-28 MED ORDER — HYDROMORPHONE HCL 1 MG/ML IJ SOLN
0.2500 mg | INTRAMUSCULAR | Status: DC | PRN
  Administered 2022-01-28 (×2): 0.5 mg via INTRAVENOUS

## 2022-01-28 MED ORDER — BUPIVACAINE HCL (PF) 0.25 % IJ SOLN
INTRAMUSCULAR | Status: AC
  Filled 2022-01-28: qty 30

## 2022-01-28 MED ORDER — ONDANSETRON HCL 4 MG/2ML IJ SOLN
INTRAMUSCULAR | Status: DC | PRN
  Administered 2022-01-28: 4 mg via INTRAVENOUS

## 2022-01-28 MED ORDER — AMISULPRIDE (ANTIEMETIC) 5 MG/2ML IV SOLN: 10.0000 mg | Freq: Once | INTRAVENOUS | Status: DC | PRN

## 2022-01-28 MED ORDER — OXYCODONE HCL 5 MG PO TABS
5.0000 mg | ORAL_TABLET | Freq: Four times a day (QID) | ORAL | 0 refills | Status: AC | PRN
Start: 2022-01-28 — End: 2022-01-31

## 2022-01-28 MED ORDER — CEFAZOLIN SODIUM-DEXTROSE 2-4 GM/100ML-% IV SOLN
INTRAVENOUS | Status: AC
  Filled 2022-01-28: qty 100

## 2022-01-28 MED ORDER — FENTANYL CITRATE (PF) 100 MCG/2ML IJ SOLN
INTRAMUSCULAR | Status: AC
  Filled 2022-01-28: qty 2

## 2022-01-28 MED ORDER — LIDOCAINE 2% (20 MG/ML) 5 ML SYRINGE
INTRAMUSCULAR | Status: AC
  Filled 2022-01-28: qty 5

## 2022-01-28 MED ORDER — CEFAZOLIN SODIUM-DEXTROSE 2-4 GM/100ML-% IV SOLN
2.0000 g | INTRAVENOUS | Status: AC
  Administered 2022-01-28: 2 g via INTRAVENOUS

## 2022-01-28 MED ORDER — ACETAMINOPHEN 500 MG PO TABS
1000.0000 mg | ORAL_TABLET | Freq: Once | ORAL | Status: AC
  Administered 2022-01-28: 1000 mg via ORAL

## 2022-01-28 MED ORDER — PROPOFOL 500 MG/50ML IV EMUL
INTRAVENOUS | Status: AC
  Filled 2022-01-28: qty 50

## 2022-01-28 MED ORDER — ONDANSETRON HCL 4 MG/2ML IJ SOLN: 4.0000 mg | Freq: Once | INTRAMUSCULAR | Status: DC | PRN

## 2022-01-28 MED ORDER — 0.9 % SODIUM CHLORIDE (POUR BTL) OPTIME
TOPICAL | Status: DC | PRN
  Administered 2022-01-28: 60 mL

## 2022-01-28 MED ORDER — LACTATED RINGERS IV SOLN: INTRAVENOUS | Status: DC

## 2022-01-28 MED ORDER — ONDANSETRON HCL 4 MG/2ML IJ SOLN
INTRAMUSCULAR | Status: AC
  Filled 2022-01-28: qty 2

## 2022-01-28 MED ORDER — MIDAZOLAM HCL 2 MG/2ML IJ SOLN
INTRAMUSCULAR | Status: AC
  Filled 2022-01-28: qty 2

## 2022-01-28 MED ORDER — HYDROMORPHONE HCL 1 MG/ML IJ SOLN
INTRAMUSCULAR | Status: AC
  Filled 2022-01-28: qty 0.5

## 2022-01-28 MED ORDER — FENTANYL CITRATE (PF) 100 MCG/2ML IJ SOLN
INTRAMUSCULAR | Status: DC | PRN
Start: 2022-01-28 — End: 2022-01-28
  Administered 2022-01-28: 50 ug via INTRAVENOUS

## 2022-01-28 MED ORDER — ACETAMINOPHEN 500 MG PO TABS
ORAL_TABLET | ORAL | Status: AC
  Filled 2022-01-28: qty 2

## 2022-01-28 MED ORDER — LIDOCAINE HCL 1 % IJ SOLN
INTRAMUSCULAR | Status: DC | PRN
  Administered 2022-01-28: 10 mL

## 2022-01-28 MED ORDER — OXYCODONE HCL 5 MG PO TABS: 5.0000 mg | ORAL_TABLET | Freq: Once | ORAL | Status: DC | PRN

## 2022-01-28 MED ORDER — PROPOFOL 500 MG/50ML IV EMUL
INTRAVENOUS | Status: DC | PRN
  Administered 2022-01-28: 200 ug/kg/min via INTRAVENOUS

## 2022-01-28 MED ORDER — MIDAZOLAM HCL 5 MG/5ML IJ SOLN
INTRAMUSCULAR | Status: DC | PRN
Start: 2022-01-28 — End: 2022-01-28
  Administered 2022-01-28: 2 mg via INTRAVENOUS

## 2022-01-28 SURGICAL SUPPLY — 28 items
APL PRP STRL LF DISP 70% ISPRP (MISCELLANEOUS) ×1
BLADE SURG 15 STRL LF DISP TIS (BLADE) ×1 IMPLANT
BLADE SURG 15 STRL SS (BLADE) ×4
BNDG COHESIVE 1X5 TAN STRL LF (GAUZE/BANDAGES/DRESSINGS) ×1 IMPLANT
CHLORAPREP W/TINT 26 (MISCELLANEOUS) ×2 IMPLANT
CORD BIPOLAR FORCEPS 12FT (ELECTRODE) ×2 IMPLANT
COVER BACK TABLE 60X90IN (DRAPES) ×2 IMPLANT
COVER MAYO STAND STRL (DRAPES) ×2 IMPLANT
DRAPE EXTREMITY T 121X128X90 (DISPOSABLE) ×2 IMPLANT
DRAPE U-SHAPE 47X51 STRL (DRAPES) ×1 IMPLANT
GAUZE SPONGE 4X4 12PLY STRL (GAUZE/BANDAGES/DRESSINGS) ×1 IMPLANT
GAUZE XEROFORM 1X8 LF (GAUZE/BANDAGES/DRESSINGS) ×2 IMPLANT
GLOVE BIO SURGEON STRL SZ7 (GLOVE) ×2 IMPLANT
GLOVE BIOGEL PI IND STRL 7.0 (GLOVE) ×1 IMPLANT
GLOVE BIOGEL PI INDICATOR 7.0 (GLOVE) ×1
GOWN STRL REUS W/ TWL LRG LVL3 (GOWN DISPOSABLE) ×1 IMPLANT
GOWN STRL REUS W/ TWL XL LVL3 (GOWN DISPOSABLE) ×1 IMPLANT
GOWN STRL REUS W/TWL LRG LVL3 (GOWN DISPOSABLE) ×2
GOWN STRL REUS W/TWL XL LVL3 (GOWN DISPOSABLE) ×2
NDL HYPO 25X1 1.5 SAFETY (NEEDLE) IMPLANT
NEEDLE HYPO 25X1 1.5 SAFETY (NEEDLE) ×2 IMPLANT
NS IRRIG 1000ML POUR BTL (IV SOLUTION) ×2 IMPLANT
PACK BASIN DAY SURGERY FS (CUSTOM PROCEDURE TRAY) ×2 IMPLANT
SPLINT FINGER 2.25 911902 (SOFTGOODS) ×1 IMPLANT
SUT ETHILON 4 0 PS 2 18 (SUTURE) ×2 IMPLANT
SYR BULB EAR ULCER 3OZ GRN STR (SYRINGE) ×2 IMPLANT
SYR CONTROL 10ML LL (SYRINGE) ×1 IMPLANT
TOWEL GREEN STERILE FF (TOWEL DISPOSABLE) ×4 IMPLANT

## 2022-01-28 NOTE — Op Note (Signed)
? ?  Date of Surgery: 01/28/2022 ? ?INDICATIONS: Patient is a 66 y.o.-year-old male with right small finger digital mucous cyst.  Risks, benefits, and alternatives to surgery were again discussed with the patient in the preoperative area. The patient wishes to proceed with surgery.  Informed consent was signed after our discussion.  ? ?PREOPERATIVE DIAGNOSIS:  ?Right small finger digital mucous cyst ? ?POSTOPERATIVE DIAGNOSIS: Same. ? ?PROCEDURE:  ?Excision of right small finger digital mucous cyst ?Debridement of right small finger distal phalangeal osteophyte ? ? ?SURGEON: Audria Nine, M.D. ? ?ASSIST:  ? ?ANESTHESIA:  Local, MAC ? ?IV FLUIDS AND URINE: See anesthesia. ? ?ESTIMATED BLOOD LOSS: <5 mL. ? ?IMPLANTS: * No implants in log *  ? ?DRAINS: None ? ?COMPLICATIONS: None ? ?DESCRIPTION OF PROCEDURE: The patient was met in the preoperative holding area where the surgical site was marked and the consent form was verified.  The patient was then taken to the operating room and transferred to the operating table.  All bony prominences were well padded.  Monitored sedation was induced.  A formal time-out was performed to confirm that this was the correct patient, surgery, side, and site.  Following timeout, a digital block was performed using 8cc of 0.25% marcaine. The operative extremity was prepped and draped in the usual and sterile fashion.    ? ?Following timeout, a finger turnicot was applied.  Longitudinal incision was made at the radial aspect of the small finger in the interval between the terminal extensor tendon and the DIP collateral ligament.  The skin was incised.  A full-thickness skin flap was elevated from the terminal tendon.  The capsule in the interval between the terminal tendon and the dorsal aspect of the collateral ligament was sharply excised.  The cyst sac was entered using a tenotomy scissor scissor dorsal to the terminal tendon.  There was minimal to no mucinous fluid present.  There  was a large dorsal osteophyte from the base of the distal phalanx.  This was sharply debrided using a small rongeur and curette.  The area from which the cyst originated was cauterized with bipolar electrocautery.  The wound was thoroughly irrigated with copious sterile saline.  It was closed using 4-0 nylon sutures in a simple interrupted fashion. ? ?Following closure, the turnicot was removed.  Hemostasis was achieved with direct pressure over the wound.  The fingertip is warm and well-perfused with brisk capillary refill.  Wound was dressed with bacitracin ointment, Xeroform, 4 x 4's, loose Coban.  A dorsal AlumaFoam splint was applied. ? ?The patient was then reversed of anesthesia and transferred the postoperative bed.  All counts were correct times two at the end of the procedure.  Patient was then taken to PACU in stable condition. ? ?POSTOPERATIVE PLAN: He will be discharged to home with appropriate pain medication and discharge instructions.  I will see him in approximately 10 days for his first postop visit.  ? ?Audria Nine, MD ?1:38 PM  ?

## 2022-01-28 NOTE — Interval H&P Note (Signed)
History and Physical Interval Note: ? ?01/28/2022 ?12:40 PM ? ?Jerry [name]  has presented today for surgery, with the diagnosis of right small finger mucous cyst.  The various methods of treatment have been discussed with the patient and family. After consideration of risks, benefits and other options for treatment, the patient has consented to  Procedure(s): ?RIGHT SMALL FINGER MUCOUS CYST EXCISION (Right) as a surgical intervention.  The patient's history has been reviewed, patient examined, no change in status, stable for surgery.  I have reviewed the patient's chart and labs.  Questions were answered to the patient's satisfaction.   ? ? ?[name] ? ? ?

## 2022-01-28 NOTE — Discharge Instructions (Addendum)
  Charles Benfield, M.D. Hand Surgery  POST-OPERATIVE DISCHARGE INSTRUCTIONS   PRESCRIPTIONS: You may have been given a prescription to be taken as directed for post-operative pain control.  You may also take over the counter ibuprofen/aleve and tylenol for pain. Take this as directed on the packaging. Do not exceed 3000 mg tylenol/acetaminophen in 24 hours.  Ibuprofen 600-800 mg (3-4) tablets by mouth every 6 hours as needed for pain.  OR Aleve 2 tablets by mouth every 12 hours (twice daily) as needed for pain.  AND/OR Tylenol 1000 mg (2 tablets) every 8 hours as needed for pain.  Please use your pain medication carefully, as refills are limited and you may not be provided with one.  As stated above, please use over the counter pain medicine - it will also be helpful with decreasing your swelling.    ANESTHESIA: After your surgery, post-surgical discomfort or pain is likely. This discomfort can last several days to a few weeks. At certain times of the day your discomfort may be more intense.   Did you receive a nerve block?  A nerve block can provide pain relief for one hour to two days after your surgery. As long as the nerve block is working, you will experience little or no sensation in the area the surgeon operated on.  As the nerve block wears off, you will begin to experience pain or discomfort. It is very important that you begin taking your prescribed pain medication before the nerve block fully wears off. Treating your pain at the first sign of the block wearing off will ensure your pain is better controlled and more tolerable when full-sensation returns. Do not wait until the pain is intolerable, as the medicine will be less effective. It is better to treat pain in advance than to try and catch up.   General Anesthesia:  If you did not receive a nerve block during your surgery, you will need to start taking your pain medication shortly after your surgery and should continue  to do so as prescribed by your surgeon.     ICE AND ELEVATION: You may use ice for the first 48-72 hours, but it is not critical.   Motion of your fingers is very important to decrease the swelling.  Elevation, as much as possible for the next 48 hours, is critical for decreasing swelling as well as for pain relief. Elevation means when you are seated or lying down, you hand should be at or above your heart. When walking, the hand needs to be at or above the level of your elbow.  If the bandage gets too tight, it may need to be loosened. Please contact our office and we will instruct you in how to do this.    SURGICAL BANDAGES:  Keep your dressing and/or splint clean and dry at all times.  Do not remove until you are seen again in the office.  If careful, you may place a plastic bag over your bandage and tape the end to shower, but be careful, do not get your bandages wet.     HAND THERAPY:  You may not need any. If you do, we will begin this at your follow up visit in the clinic.    ACTIVITY AND WORK: You are encouraged to move any fingers which are not in the bandage.  Light use of the fingers is allowed to assist the other hand with daily hygiene and eating, but strong gripping or lifting is often uncomfortable and   should be avoided.  ?You might miss a variable period of time from work and hopefully this issue has been discussed prior to surgery. You may not do any heavy work with your affected hand for about 2 weeks.  ? ? ?[facility] ?[address] ?[place],  [place]  [address] ?[phone]  ? ? ?Post Anesthesia Home Care Instructions ? ?Activity: ?Get plenty of rest for the remainder of the day. A responsible individual must stay with you for 24 hours following the procedure.  ?For the next 24 hours, DO NOT: ?-Drive a car ?-Paediatric nurse ?-Drink alcoholic beverages ?-Take any medication unless instructed by your physician ?-Make any legal decisions or sign  important papers. ? ?Meals: ?Start with liquid foods such as gelatin or soup. Progress to regular foods as tolerated. Avoid greasy, spicy, heavy foods. If nausea and/or vomiting occur, drink only clear liquids until the nausea and/or vomiting subsides. Call your physician if vomiting continues. ? ?Special Instructions/Symptoms: ?Your throat may feel dry or sore from the anesthesia or the breathing tube placed in your throat during surgery. If this causes discomfort, gargle with warm salt water. The discomfort should disappear within 24 hours. ? ?If you had a scopolamine patch placed behind your ear for the management of post- operative nausea and/or vomiting: ? ?1. The medication in the patch is effective for 72 hours, after which it should be removed.  Wrap patch in a tissue and discard in the trash. Wash hands thoroughly with soap and water. ?2. You may remove the patch earlier than 72 hours if you experience unpleasant side effects which may include dry mouth, dizziness or visual disturbances. ?3. Avoid touching the patch. Wash your hands with soap and water after contact with the patch. ? ?No tylenol until after 4:45 if needed today ? ? ?    ?

## 2022-01-28 NOTE — Anesthesia Postprocedure Evaluation (Signed)
Anesthesia Post Note ? ?Patient: Jerry [name] ? ?Procedure(s) Performed: RIGHT SMALL FINGER MUCOUS CYST EXCISION (Right: Finger) ? ?  ? ?Patient location during evaluation: PACU ?Anesthesia Type: MAC ?Level of consciousness: awake and alert ?Pain management: pain level controlled ?Vital Signs Assessment: post-procedure vital signs reviewed and stable ?Respiratory status: spontaneous breathing, nonlabored ventilation and respiratory function stable ?Cardiovascular status: blood pressure returned to baseline and stable ?Postop Assessment: no apparent nausea or vomiting ?Anesthetic complications: no ? ? ?No notable events documented. ? ?Last Vitals:  ?Vitals:  ? 01/28/22 1415 01/28/22 1506  ?BP: (!) 174/77 (!) 152/85  ?Pulse: (!) 47 (!) 50  ?Resp: 11 20  ?Temp:  36.6 ?C  ?SpO2: 91% 98%  ?  ?Last Pain:  ?Vitals:  ? 01/28/22 1506  ?TempSrc: Oral  ?PainSc: 4   ? ? ?  ?  ?  ?  ?  ?  ? ?Jerry [name] ? ? ? ? ?

## 2022-01-28 NOTE — Anesthesia Preprocedure Evaluation (Addendum)
Anesthesia Evaluation  ?Patient identified by MRN, date of birth, ID band ?Patient awake ? ? ? ?Reviewed: ?Allergy & Precautions, NPO status , Patient's Chart, lab work & pertinent test results ? ?Airway ?Mallampati: II ? ?TM Distance: >3 FB ?Neck ROM: Full ? ? ? Dental ? ?(+) Dental Advisory Given, Teeth Intact ?  ?Pulmonary ?asthma (well controlled) , sleep apnea and Continuous Positive Airway Pressure Ventilation , former smoker,  ?Quit smoking 1974, former light smoker  ?  ?Pulmonary exam normal ?breath sounds clear to auscultation ? ? ? ? ? ? Cardiovascular ?hypertension (141/76 in preop ), Pt. on medications ?Normal cardiovascular exam ?Rhythm:Regular Rate:Normal ? ? ?  ?Neuro/Psych ?negative neurological ROS ? negative psych ROS  ? GI/Hepatic ?negative GI ROS, Neg liver ROS,   ?Endo/Other  ?diabetes, Well Controlled, Type 2, Oral Hypoglycemic Agentsfs 86 in preop  ? Renal/GU ?negative Renal ROS  ?negative genitourinary ?  ?Musculoskeletal ?negative musculoskeletal ROS ?(+)  ? Abdominal ?  ?Peds ? Hematology ?negative hematology ROS ?(+)   ?Anesthesia Other Findings ? ? Reproductive/Obstetrics ?negative OB ROS ? ?  ? ? ? ? ? ? ? ? ? ? ? ? ? ?  ?  ? ? ? ? ? ? ? ?Anesthesia Physical ?Anesthesia Plan ? ?ASA: 2 ? ?Anesthesia Plan: MAC  ? ?Post-op Pain Management: Tylenol PO (pre-op)*  ? ?Induction:  ? ?PONV Risk Score and Plan: 1 and TIVA, Propofol infusion and Treatment may vary due to age or medical condition ? ?Airway Management Planned: Natural Airway and Simple Face Mask ? ?Additional Equipment: None ? ?Intra-op Plan:  ? ?Post-operative Plan:  ? ?Informed Consent: I have reviewed the patients History and Physical, chart, labs and discussed the procedure including the risks, benefits and alternatives for the proposed anesthesia with the patient or authorized representative who has indicated his/her understanding and acceptance.  ? ? ? ?Dental advisory given ? ?Plan Discussed  with: CRNA ? ?Anesthesia Plan Comments:   ? ? ? ? ? ? ?Anesthesia Quick Evaluation ? ?

## 2022-01-28 NOTE — Transfer of Care (Signed)
Immediate Anesthesia Transfer of Care Note ? ?Patient: Jerry [name] ? ?Procedure(s) Performed: RIGHT SMALL FINGER MUCOUS CYST EXCISION (Right: Finger) ? ?Patient Location: PACU ? ?Anesthesia Type:MAC ? ?Level of Consciousness: drowsy ? ?Airway & Oxygen Therapy: Patient Spontanous Breathing and Patient connected to face mask oxygen ? ?Post-op Assessment: Report given to RN and Post -op Vital signs reviewed and stable ? ?Post vital signs: Reviewed and stable ? ?Last Vitals:  ?Vitals Value Taken Time  ?BP 155/75 01/28/22 1337  ?Temp    ?Pulse 43 01/28/22 1338  ?Resp 11 01/28/22 1338  ?SpO2 98 % 01/28/22 1338  ?Vitals shown include unvalidated device data. ? ?Last Pain:  ?Vitals:  ? 01/28/22 1139  ?TempSrc: Oral  ?PainSc: 0-No pain  ?   ? ?Patients Stated Pain Goal: 5 (01/28/22 1139) ? ?Complications: No notable events documented. ?

## 2022-01-29 ENCOUNTER — Encounter (HOSPITAL_BASED_OUTPATIENT_CLINIC_OR_DEPARTMENT_OTHER): Payer: Self-pay | Admitting: Orthopedic Surgery

## 2022-02-07 ENCOUNTER — Encounter: Payer: Self-pay | Admitting: Orthopedic Surgery

## 2022-02-07 ENCOUNTER — Ambulatory Visit (INDEPENDENT_AMBULATORY_CARE_PROVIDER_SITE_OTHER): Payer: Medicare HMO | Admitting: Orthopedic Surgery

## 2022-02-07 DIAGNOSIS — M67449 Ganglion, unspecified hand: Secondary | ICD-10-CM

## 2022-02-07 NOTE — Progress Notes (Signed)
Post-Op Visit Note   Patient: Jerry [name]           Date of Birth: [birth year]           MRN: [id number] Visit Date: 02/07/2022 PCP: Abner Greenspan, MD   Assessment & Plan:  Chief Complaint:  Chief Complaint  Patient presents with   Right Little Finger - Routine Post Op   Visit Diagnoses:  1. Digital mucous cyst of finger     Plan: Patient is 10 days out from a right small finger digital mucous cyst excision with debridement of osteophyte.  He is doing very well.  His incision is well approximated without surrounding erythema or induration.  He has no pain today.  The sutures were removed.  He can see me again as needed.  Follow-Up Instructions: No follow-ups on file.   Orders:  No orders of the defined types were placed in this encounter.  No orders of the defined types were placed in this encounter.   Imaging: No results found.  PMFS History: Patient Active Problem List   Diagnosis Date Noted   Digital mucous cyst of finger    Pulsatile tinnitus, left ear 12/20/2021   Digital mucinous cyst of finger 12/20/2021   Welcome to Medicare preventive visit 12/18/2020   Elevated TSH 12/18/2020   Hypokalemia 12/18/2020   Statin myopathy 12/18/2020   Prostatitis, chronic 02/06/2018   Family history of prostate cancer 02/06/2018   Type 2 diabetes mellitus without complication, with no history of insulin use ([place]) 01/07/2017   Former smoker 01/07/2017   Aortic atherosclerosis ([place]) 01/06/2017   Elevated PSA 04/24/2016   Routine general medical examination at a health care facility 08/05/2011   Prostate cancer screening 08/05/2011   TINNITUS 09/06/2009   COLONIC POLYPS, HX OF 01/16/2009   Obstructive sleep apnea 10/26/2008   Hyperlipidemia associated with type 2 diabetes mellitus ([place]) 03/11/2007   Essential hypertension 03/11/2007   Allergic rhinitis 03/11/2007   ASTHMA 03/11/2007   Past Medical History:  Diagnosis Date   Allergic rhinitis, cause unspecified     Asthma    Diabetes mellitus without complication ([place])    Elbow fracture age 43 or 67   left   History of kidney stones    Hx of colonic polyps    Other and unspecified hyperlipidemia    Sleep apnea    Unspecified essential hypertension     Family History  Problem Relation Age of Onset   Lung cancer Father    Diabetes Mother    Pancreatic cancer Paternal Grandfather    Colon cancer Neg Hx     Past Surgical History:  Procedure Laterality Date   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   surgery   CHOLECYSTECTOMY     GANGLION CYST EXCISION Right 01/28/2022   Procedure: RIGHT SMALL FINGER MUCOUS CYST EXCISION;  Surgeon: Sherilyn Cooter, MD;  Location: [facility];  Service: Orthopedics;  Laterality: Right;   KIDNEY STONE SURGERY     left leg fracture     TONSILLECTOMY AND ADENOIDECTOMY     2000   Social History   Occupational History   Occupation: Theme park manager  Tobacco Use   Smoking status: Former    Packs/day: 0.50    Years: 3.00    Pack years: 1.50    Types: Cigarettes    Quit date: 09/16/1972    Years since quitting: 49.4   Smokeless tobacco: Never  Substance and Sexual Activity   Alcohol use: No  Alcohol/week: 0.0 standard drinks   Drug use: No   Sexual activity: Not Currently

## 2022-02-14 HISTORY — PX: FINGER SURGERY: SHX640

## 2022-02-18 ENCOUNTER — Telehealth: Payer: Self-pay | Admitting: Family Medicine

## 2022-02-18 MED ORDER — LISINOPRIL 10 MG PO TABS: 5.0000 mg | ORAL_TABLET | Freq: Every day | ORAL | 3 refills | Status: DC

## 2022-02-18 NOTE — Telephone Encounter (Signed)
Pt called and said that he was on 5 mg lisinopril before and Dr Glori Bickers had him up it to 2 times a day to make it 10gm a day and just finish out the script he had and he said he did and when he got the new one he continued to do the same so he he was actually taking '20mg'$  a day instead by accident and now he is out and its to soon to refill the medication. He wanted to know what he can do. Call back is [phone]

## 2022-02-18 NOTE — Telephone Encounter (Signed)
Clarified inst for lisinopril Take 5 mg bid (half of a 10)

## 2022-02-18 NOTE — Telephone Encounter (Signed)
Called and notified  pt of dose of medication and that his new rx was sent to the pharmacy.

## 2022-02-19 NOTE — Progress Notes (Unsigned)
Date:  02/20/2022   ID:  Jerry [name], DOB 1956-04-09, MRN [id number]  Patient Location:  [address] [place] [address]   Provider location:   [facility], [place] office  PCP:  Abner Greenspan, MD  Cardiologist:  Patsy Baltimore  Chief Complaint  Patient presents with   New Patient (Initial Visit)    Patient was last seen in 2020 and is reestablishing care. Medications reviewed by the patient verbally. Patient c/o fatigue, pre-syncope, difficulty breathing at times and feels as if his heart is not able to keep up with the amount of activity he is trying to do.      History of Present Illness:    Jerry [name] is a 66 y.o. male With apast medical history of Former smoker, teenager only Aortic athero on CXR 12/2016 HTN,  PVCs on EKG hyperlipidemia,  borderline dm  Seen in the ER for chest pain 12/18/2016 OSA uses CPAP Rare episodes of acute onset of SON/near syncope, likely arrhythmia possibly SVT Who presents for follow up of his chest pain symptoms, near syncope  Last seen in cardiology clinic April 2020 By telemetry visit  Last weekend on Saturday, worked all day in the garden, was cleaning up picking up several items when he developed symptoms of near syncope, lightheaded, vision dark Felt like heart was not keeping up, felt like he might pass out Had relatively acute onset shortness of breath lasting 10 minutes Resolved without intervention  Has had episodes in the past, reports having initial episode 2008 May have had similar episode 2018 while bowling as detailed below  Otherwise has good exercise tolerance, works in the garden Denies chest pain on exertion  Labs reviewed on today's visit HBA1C 6.7 up to 7.0 Total chol 188, LDL 105  EKG personally reviewed by myself on todays visit Sinus bradycardia rate 53 bpm, no ST or T wave changes  Reviewed prior imaging from 2018 Chest x-ray showing aortic atherosclerosis.  12/2016  He reports that 12/18/2016, he had  finished bowling with his family  returning his shoes when he had acute onset central chest pressure with shortness of breath.   episode lasted about 5 minutes.  No radiation of his pain moderate in severity  Reports pain went away when he sat in the car on the way to the emergency room He did develop a headache no additional discomfort  in the emergency department.    In the emergency room With chest pain April 2018 EKG with normal sinus rhythm borderline Q waves inferior leads concerning for old inferior MI Troponin negative 2 BMP with potassium 3.  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   ELDERBERRY PO Take 1 tablet by mouth daily.   fexofenadine (ALLEGRA) 180 MG tablet Take 180 mg by mouth daily.   lisinopril (ZESTRIL) 10 MG tablet Take 10 mg by mouth daily.   metFORMIN (GLUCOPHAGE-XR) 500 MG 24 hr tablet Take 2 tablets (1,000 mg total) by mouth daily with breakfast.   Multiple Vitamin (MULTIVITAMIN) capsule Take 1 capsule by mouth daily.   Multiple  Vitamins-Minerals (ZINC PO) Take 1 tablet by mouth daily.   OVER THE COUNTER MEDICATION Take 1 tablet by mouth daily. Nugenix   tamsulosin (FLOMAX) 0.4 MG CAPS capsule Take 0.4 mg by mouth.   Turmeric (QC TUMERIC COMPLEX PO) Take by mouth.     Allergies:   Augmentin [amoxicillin-pot clavulanate], Crestor [rosuvastatin calcium], and Fluticasone propionate   Social History   Tobacco Use   Smoking status: Former    Packs/day: 0.50    Years: 3.00    Pack years: 1.50    Types: Cigarettes    Quit date: 09/16/1972    Years since quitting: 49.4   Smokeless tobacco: Never  Substance Use Topics   Alcohol use: No    Alcohol/week: 0.0 standard drinks   Drug use: No     Current Outpatient Medications on File Prior to Visit  Medication Sig Dispense Refill   amLODipine (NORVASC) 10 MG tablet TAKE 1 TABLET BY MOUTH EVERY DAY 90 tablet 1   Ascorbic Acid (VITAMIN C) 100 MG tablet Take 100 mg by mouth daily.     ELDERBERRY PO Take 1 tablet by mouth daily.     fexofenadine (ALLEGRA) 180 MG tablet Take 180 mg by mouth daily.     lisinopril (ZESTRIL) 10 MG tablet Take 10 mg by mouth daily.     metFORMIN (GLUCOPHAGE-XR) 500 MG 24 hr tablet Take 2 tablets (1,000 mg total) by mouth daily with breakfast. 180 tablet 1   Multiple Vitamin (MULTIVITAMIN) capsule Take 1 capsule by mouth daily.     Multiple Vitamins-Minerals (ZINC PO) Take 1 tablet by mouth daily.     OVER THE COUNTER MEDICATION Take 1 tablet by mouth daily. Nugenix     tamsulosin (FLOMAX) 0.4 MG CAPS capsule Take 0.4 mg by mouth.     Turmeric (QC TUMERIC COMPLEX PO) Take by mouth.     No current facility-administered medications on file prior to visit.     Family Hx: The patient's family history includes Diabetes in his mother; Lung cancer in his father; Pancreatic cancer in his paternal grandfather. There is no history of Colon cancer.  ROS:   Please see the history of present illness.    Review of Systems  Constitutional: Negative.    Respiratory: Negative.    Cardiovascular: Negative.   Gastrointestinal: Negative.   Musculoskeletal: Negative.   Neurological: Negative.  Seizures: .cc.  Psychiatric/Behavioral: Negative.    All other systems reviewed and are negative.   Labs/Other Tests and Data Reviewed:    Recent Labs: 06/18/2021: TSH 4.75 01/28/2022: BUN 17; Creatinine, Ser 1.05; Potassium 3.8; Sodium 140   Recent Lipid Panel Lab Results  Component Value Date/Time   CHOL 188 06/18/2021 09:36 AM   TRIG 191.0 (H) 06/18/2021 09:36 AM   HDL 44.80 06/18/2021 09:36 AM   CHOLHDL 4 06/18/2021 09:36 AM  LDLCALC 105 (H) 06/18/2021 09:36 AM   LDLCALC 129 (H) 11/12/2019 04:01 PM   LDLDIRECT 132.0 12/11/2020 08:50 AM    Wt Readings from Last 3 Encounters:  02/20/22 189 lb 8 oz (86 kg)  01/28/22 190 lb 4.1 oz (86.3 kg)  01/23/22 193 lb 9.6 oz (87.8 kg)     Exam:    Vital Signs: Vital signs may also be detailed in the HPI BP (!) 142/70 (BP Location: Right Arm, Patient Position: Sitting, Cuff Size: Normal)   Pulse (!) 53   Ht '5\' 9"'$  (1.753 m)   Wt 189 lb 8 oz (86 kg)   SpO2 98%   BMI 27.98 kg/m   Constitutional:  oriented to person, place, and time. No distress.  HENT:  Head: Grossly normal Eyes:  no discharge. No scleral icterus.  Neck: No JVD, no carotid bruits  Cardiovascular: Regular rate and rhythm, no murmurs appreciated Pulmonary/Chest: Clear to auscultation bilaterally, no wheezes or rails Abdominal: Soft.  no distension.  no tenderness.  Musculoskeletal: Normal range of motion Neurological:  normal muscle tone. Coordination normal. No atrophy Skin: Skin warm and dry Psychiatric: normal affect, pleasant   ASSESSMENT & PLAN:    Near syncope Concerning for paroxysmal arrhythmia, possible orthostasis Seems to present on a very rare basis after heavy exertion such as bowling 2018, and most recently after working full day in the yard -Recommend he consider buying Apple Watch to monitor for cardiac  arrhythmia Unable to exclude SVT/atrial tachycardia, even symptomatic bradycardia We will not add any rate controlling agents at this time given baseline bradycardia, heart rate 53 on EKG  Aortic atherosclerosis (HCC) - Seen on chest x-ray  We have ordered CT coronary calcium scoring Images above to help guide lipid management   Essential hypertension BP stable  Mixed hyperlipidemia -  Declining a statin at this time Suggest he talk with Dr. Glori Bickers   Former smoker stop smoking many years ago , when he was a teenager stable   Borderline diabetes aortic atherosclerosis on CXR Goal LDL <70 Calcium scoring as above, consider statin pending results   Palpitations Previously with  rare PVCs seen on  EKG Recommend Apple watch for further evaluation given near syncope episode this past week  GERD: Has symptoms with any NSAIDs Might need PPI    Total encounter time more than 60 minutes  Greater than 50% was spent in counseling and coordination of care with the patient   Signed, Ida Rogue, MD  02/20/2022 10:50 AM    [facility] [address], [place], [place] [address]

## 2022-02-20 ENCOUNTER — Encounter: Payer: Self-pay | Admitting: Cardiovascular Disease

## 2022-02-20 ENCOUNTER — Ambulatory Visit: Payer: Medicare HMO | Admitting: Cardiovascular Disease

## 2022-02-20 VITALS — BP 142/70 | HR 53 | Ht 69.0 in | Wt 189.5 lb

## 2022-02-20 DIAGNOSIS — I10 Essential (primary) hypertension: Secondary | ICD-10-CM

## 2022-02-20 DIAGNOSIS — I700 Atherosclerosis of aorta: Secondary | ICD-10-CM

## 2022-02-20 DIAGNOSIS — E785 Hyperlipidemia, unspecified: Secondary | ICD-10-CM | POA: Diagnosis not present

## 2022-02-20 DIAGNOSIS — E1169 Type 2 diabetes mellitus with other specified complication: Secondary | ICD-10-CM

## 2022-02-20 NOTE — Patient Instructions (Addendum)
Consider an apple watch for arrhythmia monitoring  Medication Instructions:  No changes  If you need a refill on your cardiac medications before your next appointment, please call your pharmacy.   Lab work: No new labs needed  Testing/Procedures:  We will order CT coronary calcium score  $99 at our [facility] in [place]  Please call Colletta [place] at [phone] to schedule   [address] [place], [place] [address]   Follow-Up: At [facility], you and your health needs are our priority.  As part of our continuing mission to provide you with exceptional heart care, we have created designated Provider Care Teams.  These Care Teams include your primary Cardiologist (physician) and Advanced Practice Providers (APPs -  Physician Assistants and Nurse Practitioners) who all work together to provide you with the care you need, when you need it.  You will need a follow up appointment as needed  Providers on your designated Care Team:   Murray Hodgkins, NP Christell Faith, PA-C Cadence Kathlen Mody, [place]  COVID-19 Vaccine Information can be found at: [email or web address] For questions related to vaccine distribution or appointments, please email [email or web address] or call [phone].

## 2022-02-25 ENCOUNTER — Ambulatory Visit
Admission: RE | Admit: 2022-02-25 | Discharge: 2022-02-25 | Disposition: A | Discharge status: Home / Self Care | Payer: Medicare HMO | Source: Ambulatory Visit | Attending: Family Medicine | Admitting: Family Medicine

## 2022-02-25 DIAGNOSIS — G4733 Obstructive sleep apnea (adult) (pediatric): Secondary | ICD-10-CM | POA: Diagnosis not present

## 2022-02-25 DIAGNOSIS — I700 Atherosclerosis of aorta: Secondary | ICD-10-CM | POA: Insufficient documentation

## 2022-03-01 ENCOUNTER — Telehealth: Payer: Self-pay | Admitting: Emergency Medicine

## 2022-03-01 MED ORDER — ROSUVASTATIN CALCIUM 10 MG PO TABS: 10.0000 mg | ORAL_TABLET | Freq: Every day | ORAL | 3 refills | Status: DC

## 2022-03-01 NOTE — Telephone Encounter (Signed)
Called patient, went over results and recommendations. Pt verbalized understanding, and questions, if any, were answered.   Crestor sent into patient's pharmacy

## 2022-03-01 NOTE — Telephone Encounter (Signed)
-----   Message from Minna Merritts, MD sent at 02/25/2022  1:25 PM EDT ----- CT coronary calcium scoring Elevated calcium score 380 Calcium noted in all 3 coronary vessels Would recommend cholesterol medication such as Crestor 10 mg daily Goal LDL less than 70,  preferably less than 60

## 2022-03-04 ENCOUNTER — Telehealth: Payer: Self-pay

## 2022-03-04 NOTE — Telephone Encounter (Signed)
Called and spoke with pt advised that he did have reaction to this medication ,advised him to reach out to his cardiologist medication.

## 2022-03-04 NOTE — Telephone Encounter (Signed)
Patient is calling in stating he seen his Cardiologist and the placed him on crestor. Jerry [name] states he believes he was on this in the past and did not tolerate it well and wondered if there was a record of it.

## 2022-03-08 ENCOUNTER — Encounter: Payer: Self-pay | Admitting: Gastroenterology

## 2022-03-08 ENCOUNTER — Telehealth: Payer: Self-pay | Admitting: Cardiovascular Disease

## 2022-03-08 DIAGNOSIS — E1169 Type 2 diabetes mellitus with other specified complication: Secondary | ICD-10-CM

## 2022-03-11 ENCOUNTER — Ambulatory Visit
Admission: RE | Admit: 2022-03-11 | Discharge: 2022-03-11 | Disposition: A | Discharge status: Home / Self Care | Payer: Medicare HMO | Attending: Gastroenterology | Admitting: Gastroenterology

## 2022-03-11 ENCOUNTER — Ambulatory Visit: Payer: Medicare HMO | Admitting: Registered Nurse

## 2022-03-11 ENCOUNTER — Encounter
Admission: RE | Disposition: A | Discharge status: Home / Self Care | Payer: Self-pay | Source: Home / Self Care | Attending: Gastroenterology

## 2022-03-11 DIAGNOSIS — D124 Benign neoplasm of descending colon: Secondary | ICD-10-CM | POA: Insufficient documentation

## 2022-03-11 DIAGNOSIS — K635 Polyp of colon: Secondary | ICD-10-CM | POA: Diagnosis not present

## 2022-03-11 DIAGNOSIS — G4730 Sleep apnea, unspecified: Secondary | ICD-10-CM | POA: Diagnosis not present

## 2022-03-11 DIAGNOSIS — E785 Hyperlipidemia, unspecified: Secondary | ICD-10-CM | POA: Diagnosis not present

## 2022-03-11 DIAGNOSIS — D126 Benign neoplasm of colon, unspecified: Secondary | ICD-10-CM | POA: Diagnosis not present

## 2022-03-11 DIAGNOSIS — I10 Essential (primary) hypertension: Secondary | ICD-10-CM | POA: Diagnosis not present

## 2022-03-11 DIAGNOSIS — J309 Allergic rhinitis, unspecified: Secondary | ICD-10-CM | POA: Diagnosis not present

## 2022-03-11 DIAGNOSIS — Z7984 Long term (current) use of oral hypoglycemic drugs: Secondary | ICD-10-CM | POA: Diagnosis not present

## 2022-03-11 DIAGNOSIS — Z9049 Acquired absence of other specified parts of digestive tract: Secondary | ICD-10-CM | POA: Insufficient documentation

## 2022-03-11 DIAGNOSIS — D122 Benign neoplasm of ascending colon: Secondary | ICD-10-CM | POA: Diagnosis not present

## 2022-03-11 DIAGNOSIS — Z1211 Encounter for screening for malignant neoplasm of colon: Secondary | ICD-10-CM | POA: Diagnosis not present

## 2022-03-11 DIAGNOSIS — Z86010 Personal history of colonic polyps: Secondary | ICD-10-CM | POA: Insufficient documentation

## 2022-03-11 DIAGNOSIS — Z800 Family history of malignant neoplasm of digestive organs: Secondary | ICD-10-CM | POA: Diagnosis not present

## 2022-03-11 DIAGNOSIS — E119 Type 2 diabetes mellitus without complications: Secondary | ICD-10-CM | POA: Diagnosis not present

## 2022-03-11 DIAGNOSIS — Z87891 Personal history of nicotine dependence: Secondary | ICD-10-CM | POA: Diagnosis not present

## 2022-03-11 HISTORY — PX: COLONOSCOPY WITH PROPOFOL: SHX5780

## 2022-03-11 LAB — GLUCOSE, CAPILLARY: Glucose-Capillary: 161 mg/dL — ABNORMAL HIGH (ref 70–99)

## 2022-03-11 SURGERY — COLONOSCOPY WITH PROPOFOL
Anesthesia: General

## 2022-03-11 MED ORDER — DEXMEDETOMIDINE HCL IN NACL 80 MCG/20ML IV SOLN
INTRAVENOUS | Status: AC
  Filled 2022-03-11: qty 20

## 2022-03-11 MED ORDER — EPHEDRINE SULFATE (PRESSORS) 50 MG/ML IJ SOLN
INTRAMUSCULAR | Status: DC | PRN
  Administered 2022-03-11: 15 mg via INTRAVENOUS

## 2022-03-11 MED ORDER — STERILE WATER FOR IRRIGATION IR SOLN
Status: DC | PRN
  Administered 2022-03-11: 60 mL

## 2022-03-11 MED ORDER — PROPOFOL 1000 MG/100ML IV EMUL
INTRAVENOUS | Status: AC
  Filled 2022-03-11: qty 200

## 2022-03-11 MED ORDER — LIDOCAINE HCL (CARDIAC) PF 100 MG/5ML IV SOSY
PREFILLED_SYRINGE | INTRAVENOUS | Status: DC | PRN
  Administered 2022-03-11: 100 mg via INTRAVENOUS

## 2022-03-11 MED ORDER — PROPOFOL 500 MG/50ML IV EMUL
INTRAVENOUS | Status: DC | PRN
  Administered 2022-03-11: 200 ug/kg/min via INTRAVENOUS

## 2022-03-11 MED ORDER — LIDOCAINE HCL (PF) 2 % IJ SOLN
INTRAMUSCULAR | Status: AC
  Filled 2022-03-11: qty 40

## 2022-03-11 MED ORDER — PROPOFOL 10 MG/ML IV BOLUS
INTRAVENOUS | Status: DC | PRN
  Administered 2022-03-11: 80 mg via INTRAVENOUS

## 2022-03-11 MED ORDER — SODIUM CHLORIDE 0.9 % IV SOLN: INTRAVENOUS | Status: DC

## 2022-03-11 MED ORDER — EPHEDRINE 5 MG/ML INJ
INTRAVENOUS | Status: AC
  Filled 2022-03-11: qty 5

## 2022-03-11 NOTE — Anesthesia Postprocedure Evaluation (Signed)
Anesthesia Post Note  Patient: Jaqua P Severino  Procedure(s) Performed: COLONOSCOPY WITH PROPOFOL  Patient location during evaluation: Endoscopy Anesthesia Type: General Level of consciousness: awake and alert Pain management: pain level controlled Vital Signs Assessment: post-procedure vital signs reviewed and stable Respiratory status: spontaneous breathing, nonlabored ventilation, respiratory function stable and patient connected to nasal cannula oxygen Cardiovascular status: blood pressure returned to baseline and stable Postop Assessment: no apparent nausea or vomiting Anesthetic complications: no   No notable events documented.   Last Vitals:  Vitals:   03/11/22 0827 03/11/22 0843  BP:  135/77  Pulse: (!) 54   Resp:    Temp: (!) 35.9 C   SpO2:      Last Pain:  Vitals:   03/11/22 0843  TempSrc:   PainSc: 0-No pain                 Cleda Mccreedy Kymir Coles

## 2022-03-12 ENCOUNTER — Encounter: Payer: Self-pay | Admitting: Gastroenterology

## 2022-03-12 LAB — SURGICAL PATHOLOGY

## 2022-03-13 MED ORDER — EZETIMIBE 10 MG PO TABS: 10.0000 mg | ORAL_TABLET | Freq: Every day | ORAL | 3 refills | Status: DC

## 2022-03-13 NOTE — Telephone Encounter (Signed)
Minna Merritts, MD     If having body ache on Crestor, stop the medication  We can start Zetia 10 mg daily which typically does not give any muscle or body aches  Recheck lipids in 3 months  If still elevated a second medication could be added bempedoic acid or could go to injection  Diet and exercise will help as well  Thx  TGollan    Called patient.   Went over above recommendations.   Patient verbalized understanding.   Orders placed for zetia and lipid panel in 3 months.   Pt voiced appreciation for the call.

## 2022-03-13 NOTE — Addendum Note (Signed)
Addended by: Mila Merry on: 03/13/2022 10:27 AM   Modules accepted: Orders

## 2022-03-25 ENCOUNTER — Ambulatory Visit: Payer: Medicare HMO | Admitting: Family Medicine

## 2022-03-27 DIAGNOSIS — G4733 Obstructive sleep apnea (adult) (pediatric): Secondary | ICD-10-CM | POA: Diagnosis not present

## 2022-04-01 ENCOUNTER — Ambulatory Visit: Payer: Medicare HMO | Admitting: Family Medicine

## 2022-04-26 ENCOUNTER — Ambulatory Visit (INDEPENDENT_AMBULATORY_CARE_PROVIDER_SITE_OTHER): Payer: Medicare HMO | Admitting: Family Medicine

## 2022-04-26 ENCOUNTER — Encounter: Payer: Self-pay | Admitting: Family Medicine

## 2022-04-26 VITALS — BP 114/80 | HR 54 | Ht 69.0 in | Wt 188.8 lb

## 2022-04-26 DIAGNOSIS — I10 Essential (primary) hypertension: Secondary | ICD-10-CM

## 2022-04-26 DIAGNOSIS — E119 Type 2 diabetes mellitus without complications: Secondary | ICD-10-CM | POA: Diagnosis not present

## 2022-04-26 DIAGNOSIS — E785 Hyperlipidemia, unspecified: Secondary | ICD-10-CM

## 2022-04-26 DIAGNOSIS — E1169 Type 2 diabetes mellitus with other specified complication: Secondary | ICD-10-CM | POA: Diagnosis not present

## 2022-04-26 LAB — COMPREHENSIVE METABOLIC PANEL
ALT: 23 U/L (ref 0–53)
AST: 18 U/L (ref 0–37)
Albumin: 4.4 g/dL (ref 3.5–5.2)
Alkaline Phosphatase: 30 U/L — ABNORMAL LOW (ref 39–117)
BUN: 18 mg/dL (ref 6–23)
CO2: 29 mEq/L (ref 19–32)
Calcium: 9.2 mg/dL (ref 8.4–10.5)
Chloride: 104 mEq/L (ref 96–112)
Creatinine, Ser: 1.08 mg/dL (ref 0.40–1.50)
GFR: 71.39 mL/min (ref >60.00)
Glucose, Bld: 126 mg/dL — ABNORMAL HIGH (ref 70–99)
Potassium: 4 mEq/L (ref 3.5–5.1)
Sodium: 141 mEq/L (ref 135–145)
Total Bilirubin: 0.5 mg/dL (ref 0.2–1.2)
Total Protein: 7 g/dL (ref 6.0–8.3)

## 2022-04-26 LAB — HEMOGLOBIN A1C: Hgb A1c MFr Bld: 7 % — ABNORMAL HIGH (ref 4.6–6.5)

## 2022-04-26 LAB — LIPID PANEL
Cholesterol: 153 mg/dL (ref 0–200)
HDL: 42.4 mg/dL (ref >39.00)
LDL Cholesterol: 90 mg/dL (ref 0–99)
NonHDL: 110.28
Total CHOL/HDL Ratio: 4
Triglycerides: 103 mg/dL (ref 0.0–149.0)
VLDL: 20.6 mg/dL (ref 0.0–40.0)

## 2022-04-26 LAB — POCT GLYCOSYLATED HEMOGLOBIN (HGB A1C): Hemoglobin A1C: 6.8 % — AB (ref 4.0–5.6)

## 2022-04-26 LAB — MICROALBUMIN / CREATININE URINE RATIO
Creatinine,U: 103.1 mg/dL
Microalb Creat Ratio: 1.4 mg/g (ref 0.0–30.0)
Microalb, Ur: 1.4 mg/dL (ref 0.0–1.9)

## 2022-04-26 NOTE — Patient Instructions (Addendum)
Your a1c is down to 6.8   Keep up the good work with diet and exercise  Continue current medications   Lab today   Take care of yourself

## 2022-04-26 NOTE — Progress Notes (Unsigned)
Subjective:    Patient ID: Jerry [name], male    DOB: 1956/05/03, 66 y.o.   MRN: [id number]  HPI Pt presents for f/u of DM2, HTN and chronic medical problems  Wt Readings from Last 3 Encounters:  04/26/22 188 lb 12.8 oz (85.6 kg)  03/11/22 187 lb (84.8 kg)  02/20/22 189 lb 8 oz (86 kg)   27.88 kg/m  Doing well  Had vacation and taking care of grandkids  Spent time with family    HTN bp is stable today  No cp or palpitations or headaches or edema  No side effects to medicines  BP Readings from Last 3 Encounters:  04/26/22 114/80  03/11/22 135/77  02/20/22 (!) 142/70    Lisinopril 10 mg daily  Amlodipine 10 mg daily    Pulse Readings from Last 3 Encounters:  04/26/22 (!) 54  03/11/22 (!) 54  02/20/22 (!) 53    DM2 Lab Results  Component Value Date   HGBA1C 7.0 (A) 12/20/2021   Metformin xr 1000 mg daily - he is only taking 500 ? Was just taking 1   Increased last visit  Does not check glucose   Doing better with lifestyle Changed what he eats  Walking for exercise more than in the past Feels better also   Stopped drinking coffee (was 3-5 cups per day)  Feels better without it (bladder feels better-not as irritated)   Lab Results  Component Value Date   HGBA1C 6.8 (A) 04/26/2022   This is improved   On ace Statin intolerant  Eye exam 07/2021     Lab Results  Component Value Date   MICROALBUR 2.1 (H) 12/11/2020   MICROALBUR 12.2 11/12/2019     Hyperlipidemia Lab Results  Component Value Date   CHOL 188 06/18/2021   HDL 44.80 06/18/2021   LDLCALC 105 (H) 06/18/2021   LDLDIRECT 132.0 12/11/2020   TRIG 191.0 (H) 06/18/2021   CHOLHDL 4 06/18/2021   Zetia 10 mg daily  Intolerant to statins- myopathy    Patient Active Problem List   Diagnosis Date Noted   Digital mucous cyst of finger    Pulsatile tinnitus, left ear 12/20/2021   Digital mucinous cyst of finger 12/20/2021   Welcome to Medicare preventive visit 12/18/2020    Elevated TSH 12/18/2020   Hypokalemia 12/18/2020   Statin myopathy 12/18/2020   Prostatitis, chronic 02/06/2018   Family history of prostate cancer 02/06/2018   Type 2 diabetes mellitus without complication, with no history of insulin use ([place]) 01/07/2017   Former smoker 01/07/2017   Aortic atherosclerosis ([place]) 01/06/2017   Elevated PSA 04/24/2016   Routine general medical examination at a health care facility 08/05/2011   Prostate cancer screening 08/05/2011   TINNITUS 09/06/2009   COLONIC POLYPS, HX OF 01/16/2009   Obstructive sleep apnea 10/26/2008   Hyperlipidemia associated with type 2 diabetes mellitus ([place]) 03/11/2007   Essential hypertension 03/11/2007   Allergic rhinitis 03/11/2007   ASTHMA 03/11/2007   Past Medical History:  Diagnosis Date   Allergic rhinitis, cause unspecified    Asthma    Diabetes mellitus without complication ([place])    Elbow fracture age 67 or 54   left   History of kidney stones    Hx of colonic polyps    Other and unspecified hyperlipidemia    Sleep apnea    Unspecified essential hypertension    Past Surgical History:  Procedure Laterality Date   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   surgery  CHOLECYSTECTOMY     COLONOSCOPY WITH PROPOFOL N/A 03/11/2022   Procedure: COLONOSCOPY WITH PROPOFOL;  Surgeon: Jonathon Bellows, MD;  Location: [facility] ENDOSCOPY;  Service: Gastroenterology;  Laterality: N/A;   FINGER SURGERY Right 02/2022   FRACTURE SURGERY     GANGLION CYST EXCISION Right 01/28/2022   Procedure: RIGHT SMALL FINGER MUCOUS CYST EXCISION;  Surgeon: Sherilyn Cooter, MD;  Location: [facility];  Service: Orthopedics;  Laterality: Right;   KIDNEY STONE SURGERY     left leg fracture     TONSILLECTOMY AND ADENOIDECTOMY     2000   Social History   Tobacco Use   Smoking status: Former    Packs/day: 0.50    Years: 3.00    Total pack years: 1.50    Types: Cigarettes    Quit date: 09/16/1972    Years since quitting: 49.6    Smokeless tobacco: Never  Vaping Use   Vaping Use: Never used  Substance Use Topics   Alcohol use: No    Alcohol/week: 0.0 standard drinks of alcohol   Drug use: No   Family History  Problem Relation Age of Onset   Lung cancer Father    Diabetes Mother    Pancreatic cancer Paternal Grandfather    Colon cancer Neg Hx    Allergies  Allergen Reactions   Augmentin [Amoxicillin-Pot Clavulanate] Nausea And Vomiting   Crestor [Rosuvastatin Calcium]     Body pain    Fluticasone Propionate     REACTION: nosebleeds   Current Outpatient Medications on File Prior to Visit  Medication Sig Dispense Refill   amLODipine (NORVASC) 10 MG tablet TAKE 1 TABLET BY MOUTH EVERY DAY 90 tablet 1   Ascorbic Acid (VITAMIN C) 100 MG tablet Take 100 mg by mouth daily.     ELDERBERRY PO Take 1 tablet by mouth daily.     ezetimibe (ZETIA) 10 MG tablet Take 1 tablet (10 mg total) by mouth daily. 90 tablet 3   fexofenadine (ALLEGRA) 180 MG tablet Take 180 mg by mouth daily.     lisinopril (ZESTRIL) 10 MG tablet Take 10 mg by mouth daily.     Multiple Vitamin (MULTIVITAMIN) capsule Take 1 capsule by mouth daily.     Multiple Vitamins-Minerals (ZINC PO) Take 1 tablet by mouth daily.     OVER THE COUNTER MEDICATION Take 1 tablet by mouth daily. Nugenix     tamsulosin (FLOMAX) 0.4 MG CAPS capsule Take 0.4 mg by mouth.     Turmeric (QC TUMERIC COMPLEX PO) Take by mouth.     No current facility-administered medications on file prior to visit.    Review of Systems  Constitutional:  Negative for activity change, appetite change, fatigue, fever and unexpected weight change.  HENT:  Negative for congestion, rhinorrhea, sore throat and trouble swallowing.   Eyes:  Negative for pain, redness, itching and visual disturbance.  Respiratory:  Negative for cough, chest tightness, shortness of breath and wheezing.   Cardiovascular:  Negative for chest pain and palpitations.  Gastrointestinal:  Negative for abdominal pain,  blood in stool, constipation, diarrhea and nausea.  Endocrine: Negative for cold intolerance, heat intolerance, polydipsia and polyuria.  Genitourinary:  Negative for difficulty urinating, dysuria, frequency and urgency.  Musculoskeletal:  Negative for arthralgias, joint swelling and myalgias.  Skin:  Negative for pallor and rash.  Neurological:  Negative for dizziness, tremors, weakness, numbness and headaches.  Hematological:  Negative for adenopathy. Does not bruise/bleed easily.  Psychiatric/Behavioral:  Negative for decreased concentration and  dysphoric mood. The patient is not nervous/anxious.        Objective:   Physical Exam Constitutional:      General: He is not in acute distress.    Appearance: Normal appearance. He is well-developed and normal weight. He is not ill-appearing or diaphoretic.  HENT:     Head: Normocephalic and atraumatic.  Eyes:     General: No scleral icterus.    Conjunctiva/sclera: Conjunctivae normal.     Pupils: Pupils are equal, round, and reactive to light.  Neck:     Thyroid: No thyromegaly.     Vascular: No carotid bruit or JVD.  Cardiovascular:     Rate and Rhythm: Normal rate and regular rhythm.     Pulses: Normal pulses.     Heart sounds: Normal heart sounds.     No gallop.  Pulmonary:     Effort: Pulmonary effort is normal. No respiratory distress.     Breath sounds: Normal breath sounds. No wheezing or rales.  Abdominal:     General: There is no distension or abdominal bruit.     Palpations: Abdomen is soft.  Musculoskeletal:     Cervical back: Normal range of motion and neck supple.     Right lower leg: No edema.     Left lower leg: No edema.  Lymphadenopathy:     Cervical: No cervical adenopathy.  Skin:    General: Skin is warm and dry.     Coloration: Skin is not pale.     Findings: No rash.  Neurological:     Mental Status: He is alert.     Coordination: Coordination normal.     Deep Tendon Reflexes: Reflexes are normal and  symmetric. Reflexes normal.  Psychiatric:        Mood and Affect: Mood normal.           Assessment & Plan:   Problem List Items Addressed This Visit       Cardiovascular and Mediastinum   Essential hypertension    bp in fair control at this time  BP Readings from Last 1 Encounters:  04/26/22 114/80  No changes needed Most recent labs reviewed  Disc lifstyle change with low sodium diet and exercise  Plan to continue  Lisinopril 10 mg daily  Amlodipine 10 mg daily  Lab today      Relevant Orders   Lipid panel (Completed)   Comprehensive metabolic panel (Completed)     Endocrine   Hyperlipidemia associated with type 2 diabetes mellitus ([place])    Disc goals for lipids and reasons to control them Rev last labs with pt Rev low sat fat diet in detail Intol of statins On zetita 10 mg daily  Lab ordered       Relevant Medications   metFORMIN (GLUCOPHAGE-XR) 500 MG 24 hr tablet   Other Relevant Orders   Lipid panel (Completed)   Comprehensive metabolic panel (Completed)   Type 2 diabetes mellitus without complication, with no history of insulin use ([place]) - Primary    Lab Results  Component Value Date   HGBA1C 7.0 (H) 04/26/2022  Improved  Taking ace No statin due to myopathy Eye exam utd  Metformin xr 500 mg daily  Lifestyle habits are better, enc to continue this       Relevant Medications   metFORMIN (GLUCOPHAGE-XR) 500 MG 24 hr tablet   Other Relevant Orders   Microalbumin / creatinine urine ratio (Completed)   POCT glycosylated hemoglobin (Hb A1C) (Completed)  Hemoglobin A1c (Completed)

## 2022-04-27 DIAGNOSIS — G4733 Obstructive sleep apnea (adult) (pediatric): Secondary | ICD-10-CM | POA: Diagnosis not present

## 2022-04-28 MED ORDER — METFORMIN HCL ER 500 MG PO TB24: 500.0000 mg | ORAL_TABLET | Freq: Every day | ORAL | 0 refills | Status: DC

## 2022-04-28 NOTE — Assessment & Plan Note (Signed)
bp in fair control at this time  BP Readings from Last 1 Encounters:  04/26/22 114/80   No changes needed Most recent labs reviewed  Disc lifstyle change with low sodium diet and exercise  Plan to continue  Lisinopril 10 mg daily  Amlodipine 10 mg daily  Lab today

## 2022-04-28 NOTE — Assessment & Plan Note (Signed)
Lab Results  Component Value Date   HGBA1C 7.0 (H) 04/26/2022   Improved  Taking ace No statin due to myopathy Eye exam utd  Metformin xr 500 mg daily  Lifestyle habits are better, enc to continue this

## 2022-04-28 NOTE — Assessment & Plan Note (Signed)
Disc goals for lipids and reasons to control them Rev last labs with pt Rev low sat fat diet in detail Intol of statins On zetita 10 mg daily  Lab ordered

## 2022-04-29 NOTE — Progress Notes (Deleted)
$'@Patient'T$  ID: Jerry [name], male    DOB: 1956/05/11, 66 y.o.   MRN: [id number]  No chief complaint on file.   Referring provider: Tower, Wynelle Fanny, MD  HPI: 66 year old male, former remote smoker followed for severe OSA on CPAP.  He is a patient of Dr. Bari Mantis and last seen in office 01/23/2022.  OSA was first diagnosed in 2001 with very severe OSA, AHI greater than 100 events per hour.  He lost a considerable amount of weight since then; however, repeat home sleep study still showed severe OSA with AHI 35/h.  Past medical history significant for hypertension, diabetes, seasonal allergies, HLD.  TEST/EVENTS:  2001 NPSG: AHI 124/h 11/2014 HST: AHI 35/h  01/23/2022: OV with Dr. Elsworth Soho to reestablish care.  Last seen in 2016 we provided him with a new machine with auto settings 5-20 cmH2O.  Very compliant and does not miss a single night.  Uses nasal pillow mask.  Order sent for new machine and settings changed to 8 to 12 cm of water to try to reduce breakthrough events.  Allergies  Allergen Reactions   Augmentin [Amoxicillin-Pot Clavulanate] Nausea And Vomiting   Crestor [Rosuvastatin Calcium]     Body pain    Fluticasone Propionate     REACTION: nosebleeds    Immunization History  Administered Date(s) Administered   Influenza Split 08/05/2011, 10/23/2012, 09/04/2015   Influenza Whole 07/10/2005, 06/21/2008   Influenza,inj,Quad PF,6+ Mos 07/04/2017, 07/27/2018   Pneumococcal Polysaccharide-23 08/14/2005   Td 01/14/2002   Tdap 08/05/2011, 05/07/2019, 06/21/2021    Past Medical History:  Diagnosis Date   Allergic rhinitis, cause unspecified    Asthma    Diabetes mellitus without complication ([place])    Elbow fracture age 24 or 27   left   History of kidney stones    Hx of colonic polyps    Other and unspecified hyperlipidemia    Sleep apnea    Unspecified essential hypertension     Tobacco History: Social History   Tobacco Use  Smoking Status Former   Packs/day: 0.50    Years: 3.00   Total pack years: 1.50   Types: Cigarettes   Quit date: 09/16/1972   Years since quitting: 49.6  Smokeless Tobacco Never   Counseling given: Not Answered   Outpatient Medications Prior to Visit  Medication Sig Dispense Refill   amLODipine (NORVASC) 10 MG tablet TAKE 1 TABLET BY MOUTH EVERY DAY 90 tablet 1   Ascorbic Acid (VITAMIN C) 100 MG tablet Take 100 mg by mouth daily.     ELDERBERRY PO Take 1 tablet by mouth daily.     ezetimibe (ZETIA) 10 MG tablet Take 1 tablet (10 mg total) by mouth daily. 90 tablet 3   fexofenadine (ALLEGRA) 180 MG tablet Take 180 mg by mouth daily.     lisinopril (ZESTRIL) 10 MG tablet Take 10 mg by mouth daily.     metFORMIN (GLUCOPHAGE-XR) 500 MG 24 hr tablet Take 1 tablet (500 mg total) by mouth daily with breakfast. 1 tablet 0   Multiple Vitamin (MULTIVITAMIN) capsule Take 1 capsule by mouth daily.     Multiple Vitamins-Minerals (ZINC PO) Take 1 tablet by mouth daily.     OVER THE COUNTER MEDICATION Take 1 tablet by mouth daily. Nugenix     tamsulosin (FLOMAX) 0.4 MG CAPS capsule Take 0.4 mg by mouth.     Turmeric (QC TUMERIC COMPLEX PO) Take by mouth.     No facility-administered medications prior to visit.  Review of Systems:   Constitutional: No weight loss or gain, night sweats, fevers, chills, fatigue, or lassitude. HEENT: No headaches, difficulty swallowing, tooth/dental problems, or sore throat. No sneezing, itching, ear ache, nasal congestion, or post nasal drip CV:  No chest pain, orthopnea, PND, swelling in lower extremities, anasarca, dizziness, palpitations, syncope Resp: No shortness of breath with exertion or at rest. No excess mucus or change in color of mucus. No productive or non-productive. No hemoptysis. No wheezing.  No chest wall deformity GI:  No heartburn, indigestion, abdominal pain, nausea, vomiting, diarrhea, change in bowel habits, loss of appetite, bloody stools.  GU: No dysuria, change in color of urine,  urgency or frequency.  No flank pain, no hematuria  Skin: No rash, lesions, ulcerations MSK:  No joint pain or swelling.  No decreased range of motion.  No back pain. Neuro: No dizziness or lightheadedness.  Psych: No depression or anxiety. Mood stable.     Physical Exam:  There were no vitals taken for this visit.  GEN: Pleasant, interactive, well-nourished/chronically-ill appearing/acutely-ill appearing/poorly-nourished/morbidly obese; in no acute distress.****** HEENT:  Normocephalic and atraumatic. EACs patent bilaterally. TM pearly gray with present light reflex bilaterally. PERRLA. Sclera white. Nasal turbinates pink, moist and patent bilaterally. No rhinorrhea present. Oropharynx pink and moist, without exudate or edema. No lesions, ulcerations, or postnasal drip.  NECK:  Supple w/ fair ROM. No JVD present. Normal carotid impulses w/o bruits. Thyroid symmetrical with no goiter or nodules palpated. No lymphadenopathy.   CV: RRR, no m/r/g, no peripheral edema. Pulses intact, +2 bilaterally. No cyanosis, pallor or clubbing. PULMONARY:  Unlabored, regular breathing. Clear bilaterally A&P w/o wheezes/rales/rhonchi. No accessory muscle use. No dullness to percussion. GI: BS present and normoactive. Soft, non-tender to palpation. No organomegaly or masses detected. No CVA tenderness. MSK: No erythema, warmth or tenderness. Cap refil <2 sec all extrem. No deformities or joint swelling noted.  Neuro: A/Ox3. No focal deficits noted.   Skin: Warm, no lesions or rashe Psych: Normal affect and behavior. Judgement and thought content appropriate.     Lab Results:  CBC    Component Value Date/Time   WBC 7.0 12/11/2020 0850   RBC 4.57 12/11/2020 0850   HGB 14.3 12/11/2020 0850   HCT 40.9 12/11/2020 0850   PLT 201.0 12/11/2020 0850   MCV 89.4 12/11/2020 0850   MCH 30.8 11/12/2019 1601   MCHC 34.9 12/11/2020 0850   RDW 14.5 12/11/2020 0850   LYMPHSABS 2.6 12/11/2020 0850   MONOABS 0.6  12/11/2020 0850   EOSABS 0.3 12/11/2020 0850   BASOSABS 0.1 12/11/2020 0850    BMET    Component Value Date/Time   NA 141 04/26/2022 0841   K 4.0 04/26/2022 0841   CL 104 04/26/2022 0841   CO2 29 04/26/2022 0841   GLUCOSE 126 (H) 04/26/2022 0841   BUN 18 04/26/2022 0841   CREATININE 1.08 04/26/2022 0841   CREATININE 1.05 11/12/2019 1601   CALCIUM 9.2 04/26/2022 0841   GFRNONAA >60 01/28/2022 1136   GFRAA >60 01/31/2018 0647    BNP No results found for: "BNP"   Imaging:  No results found.        No data to display          No results found for: "NITRICOXIDE"      Assessment & Plan:   No problem-specific Assessment & Plan notes found for this encounter.   I spent *** minutes of dedicated to the care of this patient on the date of  this encounter to include pre-visit review of records, face-to-face time with the patient discussing conditions above, post visit ordering of testing, clinical documentation with the electronic health record, making appropriate referrals as documented, and communicating necessary findings to members of the patients care team.  Clayton Bibles, NP 04/29/2022  Pt aware and understands NP's role.

## 2022-04-30 ENCOUNTER — Ambulatory Visit: Payer: Medicare HMO | Admitting: Nurse Practitioner

## 2022-05-09 ENCOUNTER — Ambulatory Visit: Payer: Medicare HMO | Admitting: Nurse Practitioner

## 2022-05-15 ENCOUNTER — Ambulatory Visit: Payer: Medicare HMO | Admitting: Nurse Practitioner

## 2022-05-15 ENCOUNTER — Encounter: Payer: Self-pay | Admitting: Nurse Practitioner

## 2022-05-15 DIAGNOSIS — G4733 Obstructive sleep apnea (adult) (pediatric): Secondary | ICD-10-CM | POA: Diagnosis not present

## 2022-05-15 NOTE — Patient Instructions (Addendum)
Continue to use CPAP every night, minimum of 4-6 hours a night.  Change equipment every 30 days or as directed by DME. Wash your tubing with warm soap and water daily, hang to dry. Wash humidifier portion weekly.  Be aware of reduced alertness and do not drive or operate heavy machinery if experiencing this or drowsiness.  Exercise encouraged, as tolerated. Avoid or decrease alcohol consumption and medications that make you more sleepy, if possible. Notify if persistent daytime sleepiness occurs even with consistent use of CPAP.   Follow up in 1 year with Dr. Elsworth Soho. If symptoms do not improve or worsen, please contact office for sooner follow up or seek emergency care.

## 2022-05-15 NOTE — Assessment & Plan Note (Signed)
Severe OSA on CPAP. He has excellent compliance and continue to receive good benefit from use. No significant daytime symptoms; occasionally gets sleepy with watching TV or reading when quiet. Tightening his parameters has reduced his breakthrough events. Residual AHI is 3.1. No changes made today.   Patient Instructions  Continue to use CPAP every night, minimum of 4-6 hours a night.  Change equipment every 30 days or as directed by DME. Wash your tubing with warm soap and water daily, hang to dry. Wash humidifier portion weekly.  Be aware of reduced alertness and do not drive or operate heavy machinery if experiencing this or drowsiness.  Exercise encouraged, as tolerated. Avoid or decrease alcohol consumption and medications that make you more sleepy, if possible. Notify if persistent daytime sleepiness occurs even with consistent use of CPAP.   Follow up in 1 year with Dr. Elsworth Soho. If symptoms do not improve or worsen, please contact office for sooner follow up or seek emergency care.

## 2022-05-15 NOTE — Progress Notes (Signed)
$'@Patient'V$  ID: Jerry [name], male    DOB: 1956-08-04, 66 y.o.   MRN: [id number]  Chief Complaint  Patient presents with   Follow-up    Cpap compliance     Referring provider: Tower, Wynelle Fanny, MD  HPI: 66 year old male, former remote smoker for OSA on CPAP.  He is a patient Dr. Bari Mantis and last seen in office 01/23/2022.  Past medical history significant for hypertension, allergic rhinitis, HLD, DM 2, asthma.  TEST/EVENTS:  2001 NPSG: AHI 124/hr 11/2014 HST after weight loss: AHI 35/h  01/23/2022: OV with Dr. Elsworth Soho.  Last seen in 2016.  He was provided with a new machine with auto settings 5-20 cmH2O.  Very compliant does not miss a single night.  [place] 12.  Has some sleepiness while watching TV or sitting and reading.  Weight has not changed in the past 6 years.  CPAP download showed residual AHI of 7/h; did have some nights where his AHI was as high as 20.  Tightened his settings to 8-12 and sent orders for new CPAP machine.  05/15/2022: Today-follow-up Patient presents today for follow-up after getting new CPAP machine and having his settings adjusted.  He reports that he has been sleeping well.  Wakes in the morning feeling rested.  Does not have any significant daytime fatigue or any issues with drowsy driving.  He does occasionally get tired with watching TV or sitting and reading, especially if it is quiet.  Does not have any significant mask leaks and his wife has not noticed any snoring.  Wears a CPAP every night without fail.  Gets around 6 to 6-1/2 hours of sleep at night.  04/14/2022-05/13/2022 CPAP auto 8-12 cmH2O 30/30 days; 100% >4hr; average usage 6 hours 17 minutes Pressure median 9.3, 95th 11.3 Leaks median 6, 95th 17.6 AHI 3.1  Allergies  Allergen Reactions   Augmentin [Amoxicillin-Pot Clavulanate] Nausea And Vomiting   Crestor [Rosuvastatin Calcium]     Body pain    Fluticasone Propionate     REACTION: nosebleeds    Immunization History  Administered Date(s)  Administered   Influenza Split 08/05/2011, 10/23/2012, 09/04/2015   Influenza Whole 07/10/2005, 06/21/2008   Influenza,inj,Quad PF,6+ Mos 07/04/2017, 07/27/2018   Pneumococcal Polysaccharide-23 08/14/2005   Td 01/14/2002   Tdap 08/05/2011, 05/07/2019, 06/21/2021    Past Medical History:  Diagnosis Date   Allergic rhinitis, cause unspecified    Asthma    Diabetes mellitus without complication ([place])    Elbow fracture age 30 or 63   left   History of kidney stones    Hx of colonic polyps    Other and unspecified hyperlipidemia    Sleep apnea    Unspecified essential hypertension     Tobacco History: Social History   Tobacco Use  Smoking Status Former   Packs/day: 0.50   Years: 3.00   Total pack years: 1.50   Types: Cigarettes   Quit date: 09/16/1972   Years since quitting: 49.6  Smokeless Tobacco Never   Counseling given: Not Answered   Outpatient Medications Prior to Visit  Medication Sig Dispense Refill   amLODipine (NORVASC) 10 MG tablet TAKE 1 TABLET BY MOUTH EVERY DAY 90 tablet 1   Ascorbic Acid (VITAMIN C) 100 MG tablet Take 100 mg by mouth daily.     ELDERBERRY PO Take 1 tablet by mouth daily.     ezetimibe (ZETIA) 10 MG tablet Take 1 tablet (10 mg total) by mouth daily. 90 tablet 3   fexofenadine (ALLEGRA) 180  MG tablet Take 180 mg by mouth daily.     lisinopril (ZESTRIL) 10 MG tablet Take 10 mg by mouth daily.     metFORMIN (GLUCOPHAGE-XR) 500 MG 24 hr tablet Take 1 tablet (500 mg total) by mouth daily with breakfast. 1 tablet 0   Multiple Vitamin (MULTIVITAMIN) capsule Take 1 capsule by mouth daily.     Multiple Vitamins-Minerals (ZINC PO) Take 1 tablet by mouth daily.     OVER THE COUNTER MEDICATION Take 1 tablet by mouth daily. Nugenix     tamsulosin (FLOMAX) 0.4 MG CAPS capsule Take 0.4 mg by mouth.     Turmeric (QC TUMERIC COMPLEX PO) Take by mouth.     No facility-administered medications prior to visit.     Review of Systems:   Constitutional: No  weight loss or gain, night sweats, fevers, chills, fatigue, or lassitude. HEENT: No headaches, difficulty swallowing, tooth/dental problems, or sore throat. No sneezing, itching, ear ache, nasal congestion, or post nasal drip CV:  No chest pain, orthopnea, PND, swelling in lower extremities, anasarca, dizziness, palpitations, syncope Resp: No shortness of breath with exertion or at rest. No excess mucus or change in color of mucus. No productive or non-productive. No hemoptysis. No wheezing.  No chest wall deformity GI:  No heartburn, indigestion, abdominal pain, nausea, vomiting, diarrhea, change in bowel habits, loss of appetite, bloody stools.  Neuro: No dizziness or lightheadedness.  Psych: No depression or anxiety. Mood stable.     Physical Exam:  BP 122/80 (BP Location: Right Arm, Cuff Size: Normal)   Pulse (!) 55   Temp 97.8 F (36.6 C) (Oral)   Ht '5\' 9"'$  (1.753 m)   Wt 186 lb (84.4 kg)   SpO2 99%   BMI 27.47 kg/m   GEN: Pleasant, interactive, well-appearing; in no acute distress. HEENT:  Normocephalic and atraumatic. PERRLA. Sclera white. Nasal turbinates pink, moist and patent bilaterally. No rhinorrhea present. Oropharynx pink and moist, without exudate or edema. No lesions, ulcerations, or postnasal drip. Mallampati III NECK:  Supple w/ fair ROM.  CV: RRR, no m/r/g, no peripheral edema. Pulses intact, +2 bilaterally. No cyanosis, pallor or clubbing. PULMONARY:  Unlabored, regular breathing. Clear bilaterally A&P w/o wheezes/rales/rhonchi. No accessory muscle use. No dullness to percussion. GI: BS present and normoactive. Soft, non-tender to palpation.  MSK: No erythema, warmth or tenderness.  Neuro: A/Ox3. No focal deficits noted.   Skin: Warm, no lesions or rashe Psych: Normal affect and behavior. Judgement and thought content appropriate.     Lab Results:  CBC    Component Value Date/Time   WBC 7.0 12/11/2020 0850   RBC 4.57 12/11/2020 0850   HGB 14.3 12/11/2020  0850   HCT 40.9 12/11/2020 0850   PLT 201.0 12/11/2020 0850   MCV 89.4 12/11/2020 0850   MCH 30.8 11/12/2019 1601   MCHC 34.9 12/11/2020 0850   RDW 14.5 12/11/2020 0850   LYMPHSABS 2.6 12/11/2020 0850   MONOABS 0.6 12/11/2020 0850   EOSABS 0.3 12/11/2020 0850   BASOSABS 0.1 12/11/2020 0850    BMET    Component Value Date/Time   NA 141 04/26/2022 0841   K 4.0 04/26/2022 0841   CL 104 04/26/2022 0841   CO2 29 04/26/2022 0841   GLUCOSE 126 (H) 04/26/2022 0841   BUN 18 04/26/2022 0841   CREATININE 1.08 04/26/2022 0841   CREATININE 1.05 11/12/2019 1601   CALCIUM 9.2 04/26/2022 0841   GFRNONAA >60 01/28/2022 1136   GFRAA >60 01/31/2018 3151  BNP No results found for: "BNP"   Imaging:  No results found.        No data to display          No results found for: "NITRICOXIDE"      Assessment & Plan:   Obstructive sleep apnea Severe OSA on CPAP. He has excellent compliance and continue to receive good benefit from use. No significant daytime symptoms; occasionally gets sleepy with watching TV or reading when quiet. Tightening his parameters has reduced his breakthrough events. Residual AHI is 3.1. No changes made today.   Patient Instructions  Continue to use CPAP every night, minimum of 4-6 hours a night.  Change equipment every 30 days or as directed by DME. Wash your tubing with warm soap and water daily, hang to dry. Wash humidifier portion weekly.  Be aware of reduced alertness and do not drive or operate heavy machinery if experiencing this or drowsiness.  Exercise encouraged, as tolerated. Avoid or decrease alcohol consumption and medications that make you more sleepy, if possible. Notify if persistent daytime sleepiness occurs even with consistent use of CPAP.   Follow up in 1 year with Dr. Elsworth Soho. If symptoms do not improve or worsen, please contact office for sooner follow up or seek emergency care.     I spent 25 minutes of dedicated to the care of  this patient on the date of this encounter to include pre-visit review of records, face-to-face time with the patient discussing conditions above, post visit ordering of testing, clinical documentation with the electronic health record, making appropriate referrals as documented, and communicating necessary findings to members of the patients care team.  Clayton Bibles, NP 05/15/2022  Pt aware and understands NP's role.

## 2022-05-28 DIAGNOSIS — G4733 Obstructive sleep apnea (adult) (pediatric): Secondary | ICD-10-CM | POA: Diagnosis not present

## 2022-06-06 DIAGNOSIS — G4733 Obstructive sleep apnea (adult) (pediatric): Secondary | ICD-10-CM | POA: Diagnosis not present

## 2022-06-27 DIAGNOSIS — G4733 Obstructive sleep apnea (adult) (pediatric): Secondary | ICD-10-CM | POA: Diagnosis not present

## 2022-07-28 DIAGNOSIS — G4733 Obstructive sleep apnea (adult) (pediatric): Secondary | ICD-10-CM | POA: Diagnosis not present

## 2022-08-02 ENCOUNTER — Telehealth (INDEPENDENT_AMBULATORY_CARE_PROVIDER_SITE_OTHER): Payer: Medicare HMO | Admitting: Family Medicine

## 2022-08-02 ENCOUNTER — Encounter: Payer: Self-pay | Admitting: Family Medicine

## 2022-08-02 VITALS — BP 116/59 | HR 50 | Ht 69.0 in | Wt 178.0 lb

## 2022-08-02 DIAGNOSIS — B370 Candidal stomatitis: Secondary | ICD-10-CM | POA: Diagnosis not present

## 2022-08-02 MED ORDER — NYSTATIN 100000 UNIT/ML MT SUSP: 5.0000 mL | Freq: Three times a day (TID) | OROMUCOSAL | 0 refills | Status: DC

## 2022-08-02 NOTE — Patient Instructions (Signed)
Use the nystatin oral solution as directed  Change out toothbrush several times Keep up a good fluid intake   Update if not starting to improve in a week or if worsening

## 2022-08-02 NOTE — Progress Notes (Signed)
Virtual Visit via Video Note  I connected with Jerry [name] on 08/02/22 at 11:00 AM EST by a video enabled telemedicine application and verified that I am speaking with the correct person using two identifiers.  Location: Patient: home  Provider: office    I discussed the limitations of evaluation and management by telemedicine and the availability of in person appointments. The patient expressed understanding and agreed to proceed.  Parties involved in encounter  Patient: Jerry [name]   Provider:  Loura Pardon MD   History of Present Illness: Pt presents with c/o thrush   Thinks he has thrush over the past week  Uncomfortable /painful  Some white discoloration on both sides -that is improving  Cannot get it off with brush   No abx or steroids lately  No oral appliances  No inhalers   Using peroxide and vinegar  In the past he gargles with vinegar and water or salt water whenever he gets a sore throat of any kind  No swallowing issues  No voice change  No excess throat clearing     Patient Active Problem List   Diagnosis Date Noted   Thrush 08/02/2022   Digital mucous cyst of finger    Pulsatile tinnitus, left ear 12/20/2021   Digital mucinous cyst of finger 12/20/2021   Welcome to Medicare preventive visit 12/18/2020   Elevated TSH 12/18/2020   Hypokalemia 12/18/2020   Statin myopathy 12/18/2020   Prostatitis, chronic 02/06/2018   Family history of prostate cancer 02/06/2018   Type 2 diabetes mellitus without complication, with no history of insulin use ([place]) 01/07/2017   Former smoker 01/07/2017   Aortic atherosclerosis ([place]) 01/06/2017   Elevated PSA 04/24/2016   Routine general medical examination at a health care facility 08/05/2011   Prostate cancer screening 08/05/2011   TINNITUS 09/06/2009   COLONIC POLYPS, HX OF 01/16/2009   Obstructive sleep apnea 10/26/2008   Hyperlipidemia associated with type 2 diabetes mellitus ([address]) 03/11/2007   Essential  hypertension 03/11/2007   Allergic rhinitis 03/11/2007   ASTHMA 03/11/2007   Past Medical History:  Diagnosis Date   Allergic rhinitis, cause unspecified    Asthma    Diabetes mellitus without complication ([place])    Elbow fracture age 76 or 53   left   History of kidney stones    Hx of colonic polyps    Other and unspecified hyperlipidemia    Sleep apnea    Unspecified essential hypertension    Past Surgical History:  Procedure Laterality Date   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   surgery   CHOLECYSTECTOMY     COLONOSCOPY WITH PROPOFOL N/A 03/11/2022   Procedure: COLONOSCOPY WITH PROPOFOL;  Surgeon: Jonathon Bellows, MD;  Location: [facility] ENDOSCOPY;  Service: Gastroenterology;  Laterality: N/A;   FINGER SURGERY Right 02/2022   FRACTURE SURGERY     GANGLION CYST EXCISION Right 01/28/2022   Procedure: RIGHT SMALL FINGER MUCOUS CYST EXCISION;  Surgeon: Sherilyn Cooter, MD;  Location: [facility];  Service: Orthopedics;  Laterality: Right;   KIDNEY STONE SURGERY     left leg fracture     TONSILLECTOMY AND ADENOIDECTOMY     2000   Social History   Tobacco Use   Smoking status: Former    Packs/day: 0.50    Years: 3.00    Total pack years: 1.50    Types: Cigarettes    Quit date: 09/16/1972    Years since quitting: 49.9   Smokeless tobacco: Never  Vaping Use  Vaping Use: Never used  Substance Use Topics   Alcohol use: No    Alcohol/week: 0.0 standard drinks of alcohol   Drug use: No   Family History  Problem Relation Age of Onset   Lung cancer Father    Diabetes Mother    Pancreatic cancer Paternal Grandfather    Colon cancer Neg Hx    Allergies  Allergen Reactions   Augmentin [Amoxicillin-Pot Clavulanate] Nausea And Vomiting   Crestor [Rosuvastatin Calcium]     Body pain    Fluticasone Propionate     REACTION: nosebleeds   Current Outpatient Medications on File Prior to Visit  Medication Sig Dispense Refill   amLODipine (NORVASC) 10 MG tablet TAKE  1 TABLET BY MOUTH EVERY DAY 90 tablet 1   Ascorbic Acid (VITAMIN C) 100 MG tablet Take 100 mg by mouth daily.     ELDERBERRY PO Take 1 tablet by mouth daily.     ezetimibe (ZETIA) 10 MG tablet Take 1 tablet (10 mg total) by mouth daily. 90 tablet 3   fexofenadine (ALLEGRA) 180 MG tablet Take 180 mg by mouth daily.     lisinopril (ZESTRIL) 10 MG tablet Take 10 mg by mouth daily.     metFORMIN (GLUCOPHAGE-XR) 500 MG 24 hr tablet Take 1 tablet (500 mg total) by mouth daily with breakfast. 1 tablet 0   Multiple Vitamin (MULTIVITAMIN) capsule Take 1 capsule by mouth daily.     Multiple Vitamins-Minerals (ZINC PO) Take 1 tablet by mouth daily.     OVER THE COUNTER MEDICATION Take 1 tablet by mouth daily. Nugenix     tamsulosin (FLOMAX) 0.4 MG CAPS capsule Take 0.4 mg by mouth.     Turmeric (QC TUMERIC COMPLEX PO) Take by mouth.     No current facility-administered medications on file prior to visit.   Review of Systems  Constitutional:  Negative for chills, fever and malaise/fatigue.  HENT:  Negative for congestion, ear pain, sinus pain and sore throat.        Mouth discomfort   Eyes:  Negative for blurred vision, discharge and redness.  Respiratory:  Negative for cough, shortness of breath and stridor.   Cardiovascular:  Negative for chest pain, palpitations and leg swelling.  Gastrointestinal:  Negative for abdominal pain, diarrhea, nausea and vomiting.  Musculoskeletal:  Negative for myalgias.  Skin:  Negative for rash.  Neurological:  Negative for dizziness and headaches.    Observations/Objective:  Patient appears well, in no distress Weight is baseline  No facial swelling or asymmetry Normal voice-not hoarse and no slurred speech Noted a white film over lateral tongue worse on the L No other mouth lesions  He is swallowing normally No obvious tremor or mobility impairment Moving neck and UEs normally Able to hear the call well  No cough or shortness of breath during interview   Talkative and mentally sharp with no cognitive changes No skin changes on face or neck , no rash or pallor Affect is normal   Assessment and Plan:  Problem List Items Addressed This Visit       Digestive   Thrush - Primary    For 1-2 weeks, tongue soreness and white discoloration  Some imp with home tx (peroxide and vinegar) No inhalers, abx, steroids or oral appliances  He is diabetic  Px nystatin oral solution to use tid F/u if not starting to improve in a week   Meds ordered this encounter  Medications   nystatin (MYCOSTATIN) 100000 UNIT/ML suspension  Sig: Take 5 mLs (500,000 Units total) by mouth in the morning, at noon, and at bedtime.    Dispense:  120 mL    Refill:  0  Asked pt to change toothbrush several times  Keep up fluids  Update if not starting to improve in a week or if worsening        Relevant Medications   nystatin (MYCOSTATIN) 100000 UNIT/ML suspension    Follow Up Instructions:   Use the nystatin oral solution as directed  Change out toothbrush several times Keep up a good fluid intake   Update if not starting to improve in a week or if worsening  I discussed the assessment and treatment plan with the patient. The patient was provided an opportunity to ask questions and all were answered. The patient agreed with the plan and demonstrated an understanding of the instructions.   The patient was advised to call back or seek an in-person evaluation if the symptoms worsen or if the condition fails to improve as anticipated.     Loura Pardon, MD

## 2022-08-02 NOTE — Assessment & Plan Note (Signed)
For 1-2 weeks, tongue soreness and white discoloration  Some imp with home tx (peroxide and vinegar) No inhalers, abx, steroids or oral appliances  He is diabetic  Px nystatin oral solution to use tid F/u if not starting to improve in a week   Meds ordered this encounter  Medications   nystatin (MYCOSTATIN) 100000 UNIT/ML suspension    Sig: Take 5 mLs (500,000 Units total) by mouth in the morning, at noon, and at bedtime.    Dispense:  120 mL    Refill:  0   Asked pt to change toothbrush several times  Keep up fluids  Update if not starting to improve in a week or if worsening

## 2022-08-04 ENCOUNTER — Other Ambulatory Visit: Payer: Self-pay | Admitting: Family Medicine

## 2022-08-27 DIAGNOSIS — G4733 Obstructive sleep apnea (adult) (pediatric): Secondary | ICD-10-CM | POA: Diagnosis not present

## 2022-08-30 ENCOUNTER — Other Ambulatory Visit: Payer: Self-pay | Admitting: Family Medicine

## 2022-09-27 DIAGNOSIS — G4733 Obstructive sleep apnea (adult) (pediatric): Secondary | ICD-10-CM | POA: Diagnosis not present

## 2022-10-10 ENCOUNTER — Ambulatory Visit (INDEPENDENT_AMBULATORY_CARE_PROVIDER_SITE_OTHER): Payer: Medicare HMO | Admitting: Internal Medicine

## 2022-10-10 ENCOUNTER — Encounter: Payer: Self-pay | Admitting: Internal Medicine

## 2022-10-10 VITALS — BP 136/82 | HR 57 | Temp 97.6°F | Ht 69.0 in | Wt 182.0 lb

## 2022-10-10 DIAGNOSIS — R300 Dysuria: Secondary | ICD-10-CM | POA: Diagnosis not present

## 2022-10-10 LAB — POC URINALSYSI DIPSTICK (AUTOMATED)
Bilirubin, UA: NEGATIVE
Blood, UA: NEGATIVE
Glucose, UA: NEGATIVE
Ketones, UA: NEGATIVE
Leukocytes, UA: NEGATIVE
Nitrite, UA: NEGATIVE
Protein, UA: POSITIVE — AB
Spec Grav, UA: 1.015 (ref 1.010–1.025)
Urobilinogen, UA: 0.2 E.U./dL
pH, UA: 6 (ref 5.0–8.0)

## 2022-10-10 MED ORDER — SULFAMETHOXAZOLE-TRIMETHOPRIM 800-160 MG PO TABS: 1.0000 | ORAL_TABLET | Freq: Two times a day (BID) | ORAL | 0 refills | Status: DC

## 2022-10-10 NOTE — Patient Instructions (Signed)
Start the antibiotic right away and take 2 doses today. If your symptoms are gone by tomorrow, you can stop the antibiotic after 3 days----otherwise continue for the full 7 days.

## 2022-10-10 NOTE — Progress Notes (Signed)
Subjective:    Patient ID: Jerry [name], male    DOB: 1956-04-15, 67 y.o.   MRN: [id number]  HPI Here due to urinary symptoms  Having bad burning since about 1.5 weeks ago Steadily worsening Got really bad--now some better but not gone Some urgency and has had some incontinence  Tried azo--did help  No blood in urine Low grade fever last week--gone in past 2 days  Married --still has sex ------last 3-4 weeks ago No problems then  Current Outpatient Medications on File Prior to Visit  Medication Sig Dispense Refill   amLODipine (NORVASC) 10 MG tablet TAKE 1 TABLET BY MOUTH EVERY DAY 90 tablet 0   Ascorbic Acid (VITAMIN C) 100 MG tablet Take 100 mg by mouth daily.     ELDERBERRY PO Take 1 tablet by mouth daily.     ezetimibe (ZETIA) 10 MG tablet Take 1 tablet (10 mg total) by mouth daily. 90 tablet 3   fexofenadine (ALLEGRA) 180 MG tablet Take 180 mg by mouth daily.     lisinopril (ZESTRIL) 10 MG tablet Take 10 mg by mouth daily.     metFORMIN (GLUCOPHAGE-XR) 500 MG 24 hr tablet Take 1 tablet (500 mg total) by mouth daily with breakfast. 90 tablet 0   Multiple Vitamin (MULTIVITAMIN) capsule Take 1 capsule by mouth daily.     Multiple Vitamins-Minerals (ZINC PO) Take 1 tablet by mouth daily.     OVER THE COUNTER MEDICATION Take 1 tablet by mouth daily. Nugenix     tamsulosin (FLOMAX) 0.4 MG CAPS capsule Take 0.4 mg by mouth.     Turmeric (QC TUMERIC COMPLEX PO) Take by mouth.     No current facility-administered medications on file prior to visit.    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   surgery   CHOLECYSTECTOMY     COLONOSCOPY WITH PROPOFOL N/A 03/11/2022   Procedure: COLONOSCOPY WITH PROPOFOL;  Surgeon: Jonathon Bellows, MD;  Location: [facility] ENDOSCOPY;  Service: Gastroenterology;  Laterality: N/A;   FINGER SURGERY Right 02/2022   FRACTURE SURGERY     GANGLION CYST EXCISION Right 01/28/2022   Procedure: RIGHT SMALL FINGER MUCOUS CYST EXCISION;  Surgeon: Sherilyn Cooter, MD;  Location: [facility];  Service: Orthopedics;  Laterality: Right;   KIDNEY STONE SURGERY     left leg fracture     TONSILLECTOMY AND ADENOIDECTOMY     2000    Family History  Problem Relation Age of Onset   Lung cancer Father    Diabetes Mother    Pancreatic cancer Paternal Grandfather    Colon cancer Neg Hx     Social History   Socioeconomic History   Marital status: Married    Spouse name: Not on file   Number of children: 3   Years of education: Not on  file   Highest education level: Not on file  Occupational History   Occupation: Pastor  Tobacco Use   Smoking status: Former    Packs/day: 0.50    Years: 3.00    Total pack years: 1.50    Types: Cigarettes    Quit date: 09/16/1972    Years since quitting: 50.0   Smokeless tobacco: Never  Vaping Use   Vaping Use: Never used  Substance and Sexual Activity   Alcohol use: No    Alcohol/week: 0.0 standard drinks of alcohol   Drug use: No   Sexual activity: Not Currently  Other Topics Concern   Not on file  Social History Narrative   Not on file   Social Determinants of Health   Financial Resource Strain: Low Risk  (01/22/2022)   Overall Financial Resource Strain (CARDIA)    Difficulty of Paying Living Expenses: Not hard at all  Food Insecurity: No Food Insecurity (01/22/2022)   Hunger Vital Sign    Worried About Running  Out of Food in the Last Year: Never true    Ran Out of Food in the Last Year: Never true  Transportation Needs: No Transportation Needs (01/22/2022)   PRAPARE - Hydrologist (Medical): No    Lack of Transportation (Non-Medical): No  Physical Activity: Insufficiently Active (01/22/2022)   Exercise Vital Sign    Days of Exercise per Week: 3 days    Minutes of Exercise per Session: 40 min  Stress: No Stress Concern Present (01/22/2022)   [place]    Feeling of Stress : Not at all  Social Connections: [place] (01/22/2022)   Social Connection and Isolation Panel [NHANES]    Frequency of Communication with Friends and Family: More than three times a week    Frequency of Social Gatherings with Friends and Family: More than three times a week    Attends Religious Services: More than 4 times per year    Active Member of [employer] or Organizations: Yes    Attends Music therapist: More than 4 times per year    Marital Status: Married  Human resources officer Violence: Not At Risk (01/22/2022)   Humiliation, Afraid, Rape, and Kick questionnaire    Fear of Current or Ex-Partner: No    Emotionally Abused: No    Physically Abused: No    Sexually Abused: No   Review of Systems No N/V Eating okay Bowels are slow--but not a big deal No back pain    Objective:   Physical Exam Constitutional:      Appearance: Normal appearance.  Abdominal:     Palpations: Abdomen is soft.     Tenderness: There is no abdominal tenderness. There is no right CVA tenderness, left CVA tenderness or guarding.  Genitourinary:    Testes: Normal.     Comments: Scrotum quiet Normal urethra Neurological:     Mental Status: He is alert.            Assessment & Plan:

## 2022-10-10 NOTE — Addendum Note (Signed)
Addended by: Pilar Grammes on: 10/10/2022 02:23 PM   Modules accepted: Orders

## 2022-10-10 NOTE — Assessment & Plan Note (Signed)
Most consistent with cystitis--though urinalysis negative for leukocytes and nitrite No reason to consider STD Unlikely prostate involvement---scrotum is quiet Will send culture Septra DS 1 bid (7 day Rx but can stop after 3 days if symptoms gone with first couple of doses)

## 2022-10-11 LAB — URINE CULTURE
MICRO NUMBER:: 14473216
Result:: NO GROWTH
SPECIMEN QUALITY:: ADEQUATE

## 2022-10-20 ENCOUNTER — Telehealth: Payer: Self-pay | Admitting: Family Medicine

## 2022-10-20 DIAGNOSIS — R972 Elevated prostate specific antigen [PSA]: Secondary | ICD-10-CM

## 2022-10-20 DIAGNOSIS — E1169 Type 2 diabetes mellitus with other specified complication: Secondary | ICD-10-CM

## 2022-10-20 DIAGNOSIS — R7989 Other specified abnormal findings of blood chemistry: Secondary | ICD-10-CM

## 2022-10-20 DIAGNOSIS — I10 Essential (primary) hypertension: Secondary | ICD-10-CM

## 2022-10-20 DIAGNOSIS — E119 Type 2 diabetes mellitus without complications: Secondary | ICD-10-CM

## 2022-10-20 DIAGNOSIS — Z125 Encounter for screening for malignant neoplasm of prostate: Secondary | ICD-10-CM

## 2022-10-20 NOTE — Telephone Encounter (Signed)
-----   Message from Velna Hatchet, RT sent at 10/07/2022  9:15 AM EST ----- Regarding: Mon 2/5 lab Patient is scheduled for cpx, please order future labs.  Thanks, Anda Kraft

## 2022-10-21 ENCOUNTER — Other Ambulatory Visit (INDEPENDENT_AMBULATORY_CARE_PROVIDER_SITE_OTHER): Payer: Medicare HMO

## 2022-10-21 DIAGNOSIS — Z125 Encounter for screening for malignant neoplasm of prostate: Secondary | ICD-10-CM | POA: Diagnosis not present

## 2022-10-21 DIAGNOSIS — R7989 Other specified abnormal findings of blood chemistry: Secondary | ICD-10-CM | POA: Diagnosis not present

## 2022-10-21 DIAGNOSIS — E119 Type 2 diabetes mellitus without complications: Secondary | ICD-10-CM

## 2022-10-21 DIAGNOSIS — I10 Essential (primary) hypertension: Secondary | ICD-10-CM

## 2022-10-21 DIAGNOSIS — E1169 Type 2 diabetes mellitus with other specified complication: Secondary | ICD-10-CM

## 2022-10-21 DIAGNOSIS — E785 Hyperlipidemia, unspecified: Secondary | ICD-10-CM | POA: Diagnosis not present

## 2022-10-21 LAB — COMPREHENSIVE METABOLIC PANEL
ALT: 17 U/L (ref 0–53)
AST: 15 U/L (ref 0–37)
Albumin: 4.4 g/dL (ref 3.5–5.2)
Alkaline Phosphatase: 35 U/L — ABNORMAL LOW (ref 39–117)
BUN: 23 mg/dL (ref 6–23)
CO2: 29 mEq/L (ref 19–32)
Calcium: 9.4 mg/dL (ref 8.4–10.5)
Chloride: 105 mEq/L (ref 96–112)
Creatinine, Ser: 1.23 mg/dL (ref 0.40–1.50)
GFR: 60.87 mL/min (ref >60.00)
Glucose, Bld: 103 mg/dL — ABNORMAL HIGH (ref 70–99)
Potassium: 4.4 mEq/L (ref 3.5–5.1)
Sodium: 142 mEq/L (ref 135–145)
Total Bilirubin: 0.6 mg/dL (ref 0.2–1.2)
Total Protein: 7.2 g/dL (ref 6.0–8.3)

## 2022-10-21 LAB — CBC WITH DIFFERENTIAL/PLATELET
Basophils Absolute: 0.1 10*3/uL (ref 0.0–0.1)
Basophils Relative: 0.9 % (ref 0.0–3.0)
Eosinophils Absolute: 0.5 10*3/uL (ref 0.0–0.7)
Eosinophils Relative: 5.8 % — ABNORMAL HIGH (ref 0.0–5.0)
HCT: 40.5 % (ref 39.0–52.0)
Hemoglobin: 13.9 g/dL (ref 13.0–17.0)
Lymphocytes Relative: 35.2 % (ref 12.0–46.0)
Lymphs Abs: 3.3 10*3/uL (ref 0.7–4.0)
MCHC: 34.2 g/dL (ref 30.0–36.0)
MCV: 91.7 fl (ref 78.0–100.0)
Monocytes Absolute: 0.7 10*3/uL (ref 0.1–1.0)
Monocytes Relative: 7.6 % (ref 3.0–12.0)
Neutro Abs: 4.7 10*3/uL (ref 1.4–7.7)
Neutrophils Relative %: 50.5 % (ref 43.0–77.0)
Platelets: 326 10*3/uL (ref 150.0–400.0)
RBC: 4.42 Mil/uL (ref 4.22–5.81)
RDW: 13.8 % (ref 11.5–15.5)
WBC: 9.3 10*3/uL (ref 4.0–10.5)

## 2022-10-21 LAB — LIPID PANEL
Cholesterol: 148 mg/dL (ref 0–200)
HDL: 45.1 mg/dL (ref >39.00)
LDL Cholesterol: 89 mg/dL (ref 0–99)
NonHDL: 102.51
Total CHOL/HDL Ratio: 3
Triglycerides: 69 mg/dL (ref 0.0–149.0)
VLDL: 13.8 mg/dL (ref 0.0–40.0)

## 2022-10-21 LAB — PSA, MEDICARE: PSA: 6.67 ng/ml — ABNORMAL HIGH (ref 0.10–4.00)

## 2022-10-21 LAB — HEMOGLOBIN A1C: Hgb A1c MFr Bld: 6.5 % (ref 4.6–6.5)

## 2022-10-21 LAB — T4, FREE: Free T4: 0.75 ng/dL (ref 0.60–1.60)

## 2022-10-21 LAB — TSH: TSH: 4.97 u[IU]/mL (ref 0.35–5.50)

## 2022-10-28 ENCOUNTER — Encounter: Payer: Self-pay | Admitting: Family Medicine

## 2022-10-28 ENCOUNTER — Ambulatory Visit (INDEPENDENT_AMBULATORY_CARE_PROVIDER_SITE_OTHER): Payer: Medicare HMO | Admitting: Family Medicine

## 2022-10-28 VITALS — BP 128/64 | HR 57 | Temp 98.1°F | Ht 68.0 in | Wt 181.2 lb

## 2022-10-28 DIAGNOSIS — I700 Atherosclerosis of aorta: Secondary | ICD-10-CM

## 2022-10-28 DIAGNOSIS — R7989 Other specified abnormal findings of blood chemistry: Secondary | ICD-10-CM | POA: Diagnosis not present

## 2022-10-28 DIAGNOSIS — R972 Elevated prostate specific antigen [PSA]: Secondary | ICD-10-CM | POA: Diagnosis not present

## 2022-10-28 DIAGNOSIS — Z0000 Encounter for general adult medical examination without abnormal findings: Secondary | ICD-10-CM | POA: Diagnosis not present

## 2022-10-28 DIAGNOSIS — G720 Drug-induced myopathy: Secondary | ICD-10-CM | POA: Diagnosis not present

## 2022-10-28 DIAGNOSIS — N411 Chronic prostatitis: Secondary | ICD-10-CM

## 2022-10-28 DIAGNOSIS — T466X5A Adverse effect of antihyperlipidemic and antiarteriosclerotic drugs, initial encounter: Secondary | ICD-10-CM | POA: Diagnosis not present

## 2022-10-28 DIAGNOSIS — E785 Hyperlipidemia, unspecified: Secondary | ICD-10-CM | POA: Diagnosis not present

## 2022-10-28 DIAGNOSIS — I10 Essential (primary) hypertension: Secondary | ICD-10-CM | POA: Diagnosis not present

## 2022-10-28 DIAGNOSIS — G4733 Obstructive sleep apnea (adult) (pediatric): Secondary | ICD-10-CM | POA: Diagnosis not present

## 2022-10-28 DIAGNOSIS — E119 Type 2 diabetes mellitus without complications: Secondary | ICD-10-CM | POA: Diagnosis not present

## 2022-10-28 DIAGNOSIS — Z8042 Family history of malignant neoplasm of prostate: Secondary | ICD-10-CM | POA: Diagnosis not present

## 2022-10-28 DIAGNOSIS — E1169 Type 2 diabetes mellitus with other specified complication: Secondary | ICD-10-CM

## 2022-10-28 MED ORDER — LISINOPRIL 10 MG PO TABS: 10.0000 mg | ORAL_TABLET | Freq: Every day | ORAL | 3 refills | Status: DC

## 2022-10-28 MED ORDER — AMLODIPINE BESYLATE 10 MG PO TABS: 10.0000 mg | ORAL_TABLET | Freq: Every day | ORAL | 3 refills | Status: DC

## 2022-10-28 MED ORDER — METFORMIN HCL ER 500 MG PO TB24: 500.0000 mg | ORAL_TABLET | Freq: Every day | ORAL | 3 refills | Status: DC

## 2022-10-28 NOTE — Assessment & Plan Note (Signed)
Lab Results  Component Value Date   TSH 4.97 10/21/2022   Nl with nl FT4 now Will continue to monitor

## 2022-10-28 NOTE — Progress Notes (Signed)
Subjective:    Patient ID: Jerry [name], male    DOB: 1956-09-10, 67 y.o.   MRN: [id number]  HPI Here for health maintenance exam and to review chronic medical problems    Wt Readings from Last 3 Encounters:  10/28/22 181 lb 4 oz (82.2 kg)  10/10/22 182 lb (82.6 kg)  08/02/22 178 lb (80.7 kg)   27.56 kg/m  Vitals:   10/28/22 0855  BP: 128/64  Pulse: (!) 57  Temp: 98.1 F (36.7 C)  SpO2: 97%   Taking care of himself    Immunization History  Administered Date(s) Administered   Influenza Split 08/05/2011, 10/23/2012, 09/04/2015   Influenza Whole 07/10/2005, 06/21/2008   Influenza,inj,Quad PF,6+ Mos 07/04/2017, 07/27/2018   Pneumococcal Polysaccharide-23 08/14/2005   Td 01/14/2002   Tdap 08/05/2011, 05/07/2019, 06/21/2021   Health Maintenance Due  Topic Date Due   COVID-19 Vaccine (1) Never done   Zoster Vaccines- Shingrix (1 of 2) Never done   Pneumonia Vaccine 63+ Years old (2 of 2 - PCV) 07/28/2020   INFLUENZA VACCINE  04/16/2022   FOOT EXAM  06/18/2022   OPHTHALMOLOGY EXAM  07/30/2022   Shingrix: declines   Pna vaccine : declines   Colonoscopy 02/2022  with 7 y recall    Prostate health Sees urology  Has fam h/o prostate cancer and personal h/o elevated psa and prostatitis and bph  Lab Results  Component Value Date   PSA 6.67 (H) 10/21/2022   PSA 2.64 12/11/2020   PSA 2.3 11/12/2019    Takes flomax   Recently took septra ds for poss uti /prostate infx (this cleared up the urinary and sinus symptoms)  No longer has symptoms  Thinks prostate was swollen  Is due to see urologist  Quit caffeine and chocolate and it really helps (bothers his bladder)     HTN  with h/o aortic atherosclerosis  bp is stable today  No cp or palpitations or headaches or edema  No side effects to medicines  BP Readings from Last 3 Encounters:  10/28/22 128/64  10/10/22 136/82  08/02/22 (!) 116/59    Pulse Readings from Last 3 Encounters:  10/28/22 (!) 57   10/10/22 (!) 57  08/02/22 (!) 50   Good control   Amlodipine 10 mg daily  Lisinopril 10 mg daily   DM2 Lab Results  Component Value Date   HGBA1C 6.5 10/21/2022   Metformin xr 500 mg daily  Watches diet  Good control   Goal is to join the gym   Lab Results  Component Value Date   MICROALBUR 1.4 04/26/2022   MICROALBUR 2.1 (H) 12/11/2020   Eye exam  scheduled next month  On ace for renal protection   Hyperlipidemia  Lab Results  Component Value Date   CHOL 148 10/21/2022   CHOL 153 04/26/2022   CHOL 188 06/18/2021   Lab Results  Component Value Date   HDL 45.10 10/21/2022   HDL 42.40 04/26/2022   HDL 44.80 06/18/2021   Lab Results  Component Value Date   LDLCALC 89 10/21/2022   LDLCALC 90 04/26/2022   LDLCALC 105 (H) 06/18/2021   Lab Results  Component Value Date   TRIG 69.0 10/21/2022   TRIG 103.0 04/26/2022   TRIG 191.0 (H) 06/18/2021   Lab Results  Component Value Date   CHOLHDL 3 10/21/2022   CHOLHDL 4 04/26/2022   CHOLHDL 4 06/18/2021   Lab Results  Component Value Date   LDLDIRECT 132.0 12/11/2020   LDLDIRECT  139.0 08/08/2020   LDLDIRECT 129.0 07/04/2017   Statin myopathy -no statin Takes zetia 10 mg daily    Thyroid Lab Results  Component Value Date   TSH 4.97 10/21/2022   Normal FT4 at 0.75   Lab Results  Component Value Date   WBC 9.3 10/21/2022   HGB 13.9 10/21/2022   HCT 40.5 10/21/2022   MCV 91.7 10/21/2022   PLT 326.0 10/21/2022   Lab Results  Component Value Date   CREATININE 1.23 10/21/2022   BUN 23 10/21/2022   NA 142 10/21/2022   K 4.4 10/21/2022   CL 105 10/21/2022   CO2 29 10/21/2022   Lab Results  Component Value Date   ALT 17 10/21/2022   AST 15 10/21/2022   ALKPHOS 35 (L) 10/21/2022   BILITOT 0.6 10/21/2022     Patient Active Problem List   Diagnosis Date Noted   Digital mucous cyst of finger    Pulsatile tinnitus, left ear 12/20/2021   Digital mucinous cyst of finger 12/20/2021   Welcome  to Medicare preventive visit 12/18/2020   Elevated TSH 12/18/2020   Hypokalemia 12/18/2020   Statin myopathy 12/18/2020   Dysuria 02/06/2018   Prostatitis, chronic 02/06/2018   Family history of prostate cancer 02/06/2018   Type 2 diabetes mellitus without complication, with no history of insulin use ([place]) 01/07/2017   Former smoker 01/07/2017   Aortic atherosclerosis ([place]) 01/06/2017   Elevated PSA 04/24/2016   Routine general medical examination at a health care facility 08/05/2011   Prostate cancer screening 08/05/2011   TINNITUS 09/06/2009   COLONIC POLYPS, HX OF 01/16/2009   Obstructive sleep apnea 10/26/2008   Hyperlipidemia associated with type 2 diabetes mellitus ([place]) 03/11/2007   Essential hypertension 03/11/2007   Allergic rhinitis 03/11/2007   ASTHMA 03/11/2007   Past Medical History:  Diagnosis Date   Allergic rhinitis, cause unspecified    Asthma    Diabetes mellitus without complication (HCC)    Elbow fracture age 50 or 16   left   History of kidney stones    Hx of colonic polyps    Other and unspecified hyperlipidemia    Sleep apnea    Unspecified essential hypertension    Past Surgical History:  Procedure Laterality Date   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   surgery   CHOLECYSTECTOMY     COLONOSCOPY WITH PROPOFOL N/A 03/11/2022   Procedure: COLONOSCOPY WITH PROPOFOL;  Surgeon: Jonathon Bellows, MD;  Location: [facility] ENDOSCOPY;  Service: Gastroenterology;  Laterality: N/A;   FINGER SURGERY Right 02/2022   FRACTURE SURGERY     GANGLION CYST EXCISION Right 01/28/2022   Procedure: RIGHT SMALL FINGER MUCOUS CYST EXCISION;  Surgeon: Sherilyn Cooter, MD;  Location: [facility];  Service: Orthopedics;  Laterality: Right;   KIDNEY STONE SURGERY     left leg fracture     TONSILLECTOMY AND ADENOIDECTOMY     2000   Social History   Tobacco Use   Smoking status: Former    Packs/day: 0.50    Years: 3.00    Total pack years: 1.50    Types:  Cigarettes    Quit date: 09/16/1972    Years since quitting: 50.1   Smokeless tobacco: Never  Vaping Use   Vaping Use: Never used  Substance Use Topics   Alcohol use: No    Alcohol/week: 0.0 standard drinks of alcohol   Drug use: No   Family History  Problem Relation Age of Onset   Lung cancer Father  Diabetes Mother    Pancreatic cancer Paternal Grandfather    Colon cancer Neg Hx    Allergies  Allergen Reactions   Augmentin [Amoxicillin-Pot Clavulanate] Nausea And Vomiting   Crestor [Rosuvastatin Calcium]     Body pain    Fluticasone Propionate     REACTION: nosebleeds   Current Outpatient Medications on File Prior to Visit  Medication Sig Dispense Refill   Ascorbic Acid (VITAMIN C) 100 MG tablet Take 100 mg by mouth daily.     ELDERBERRY PO Take 1 tablet by mouth daily.     ezetimibe (ZETIA) 10 MG tablet Take 1 tablet (10 mg total) by mouth daily. 90 tablet 3   fexofenadine (ALLEGRA) 180 MG tablet Take 180 mg by mouth daily.     Multiple Vitamin (MULTIVITAMIN) capsule Take 1 capsule by mouth daily.     Multiple Vitamins-Minerals (ZINC PO) Take 1 tablet by mouth daily.     OVER THE COUNTER MEDICATION Take 1 tablet by mouth daily. Nugenix     tamsulosin (FLOMAX) 0.4 MG CAPS capsule Take 0.4 mg by mouth.     Turmeric (QC TUMERIC COMPLEX PO) Take by mouth.     No current facility-administered medications on file prior to visit.    Review of Systems  Constitutional:  Negative for activity change, appetite change, fatigue, fever and unexpected weight change.  HENT:  Negative for congestion, rhinorrhea, sore throat and trouble swallowing.   Eyes:  Negative for pain, redness, itching and visual disturbance.  Respiratory:  Negative for cough, chest tightness, shortness of breath and wheezing.   Cardiovascular:  Negative for chest pain and palpitations.  Gastrointestinal:  Negative for abdominal pain, blood in stool, constipation, diarrhea and nausea.  Endocrine: Negative for  cold intolerance, heat intolerance, polydipsia and polyuria.  Genitourinary:  Negative for difficulty urinating, dysuria, frequency and urgency.       Urinary symptoms are gone  Musculoskeletal:  Negative for arthralgias, joint swelling and myalgias.  Skin:  Negative for pallor and rash.  Neurological:  Negative for dizziness, tremors, weakness, numbness and headaches.  Hematological:  Negative for adenopathy. Does not bruise/bleed easily.  Psychiatric/Behavioral:  Negative for decreased concentration and dysphoric mood. The patient is not nervous/anxious.        Objective:   Physical Exam Constitutional:      General: He is not in acute distress.    Appearance: Normal appearance. He is well-developed and normal weight. He is not ill-appearing or diaphoretic.  HENT:     Head: Normocephalic and atraumatic.     Right Ear: Tympanic membrane, ear canal and external ear normal.     Left Ear: Tympanic membrane, ear canal and external ear normal.     Nose: Nose normal. No congestion.     Mouth/Throat:     Mouth: Mucous membranes are moist.     Pharynx: Oropharynx is clear. No posterior oropharyngeal erythema.  Eyes:     General: No scleral icterus.       Right eye: No discharge.        Left eye: No discharge.     Conjunctiva/sclera: Conjunctivae normal.     Pupils: Pupils are equal, round, and reactive to light.  Neck:     Thyroid: No thyromegaly.     Vascular: No carotid bruit or JVD.  Cardiovascular:     Rate and Rhythm: Normal rate and regular rhythm.     Pulses: Normal pulses.     Heart sounds: Normal heart sounds.  No gallop.  Pulmonary:     Effort: Pulmonary effort is normal. No respiratory distress.     Breath sounds: Normal breath sounds. No wheezing or rales.     Comments: Good air exch Chest:     Chest wall: No tenderness.  Abdominal:     General: Bowel sounds are normal. There is no distension or abdominal bruit.     Palpations: Abdomen is soft. There is no mass.      Tenderness: There is no abdominal tenderness.     Hernia: No hernia is present.  Musculoskeletal:        General: No tenderness.     Cervical back: Normal range of motion and neck supple. No rigidity. No muscular tenderness.     Right lower leg: No edema.     Left lower leg: No edema.  Lymphadenopathy:     Cervical: No cervical adenopathy.  Skin:    General: Skin is warm and dry.     Coloration: Skin is not pale.     Findings: No erythema or rash.     Comments: Solar lentigines diffusely Some sks  Neurological:     Mental Status: He is alert.     Cranial Nerves: No cranial nerve deficit.     Motor: No abnormal muscle tone.     Coordination: Coordination normal.     Gait: Gait normal.     Deep Tendon Reflexes: Reflexes are normal and symmetric. Reflexes normal.  Psychiatric:        Mood and Affect: Mood normal.        Cognition and Memory: Cognition normal.           Assessment & Plan:   Problem List Items Addressed This Visit       Cardiovascular and Mediastinum   Aortic atherosclerosis ([place])    Intol of statins but taking zetia and LDL is under 100 No symptoms  Bp is also controlled      Relevant Medications   amLODipine (NORVASC) 10 MG tablet   lisinopril (ZESTRIL) 10 MG tablet   Essential hypertension    bp in fair control at this time  BP Readings from Last 1 Encounters:  10/28/22 128/64  No changes needed Most recent labs reviewed  Disc lifstyle change with low sodium diet and exercise  Plan to continue  Lisinopril 10 mg daily  Amlodipine 10 mg daily        Relevant Medications   amLODipine (NORVASC) 10 MG tablet   lisinopril (ZESTRIL) 10 MG tablet     Endocrine   Hyperlipidemia associated with type 2 diabetes mellitus ([place])    Disc goals for lipids and reasons to control them Rev last labs with pt Rev low sat fat diet in detail Taking zetia Cannot take statin due to myopathy   LDL is under 100 now with better diet also       Relevant Medications   amLODipine (NORVASC) 10 MG tablet   metFORMIN (GLUCOPHAGE-XR) 500 MG 24 hr tablet   lisinopril (ZESTRIL) 10 MG tablet   Type 2 diabetes mellitus without complication, with no history of insulin use (HCC)    Lab Results  Component Value Date   HGBA1C 6.5 10/21/2022  Improved with better diet  Continue metformin xr 500 mg daily  Continue exercise  Microalb utd Eye exam sched next mo On ace Intol of statin       Relevant Medications   metFORMIN (GLUCOPHAGE-XR) 500 MG 24 hr tablet   lisinopril (ZESTRIL)  10 MG tablet     Musculoskeletal and Integument   Statin myopathy    Cannot take statin med  Does well with zetia         Genitourinary   Prostatitis, chronic    Due for f/u with urology  Had recent urinary symptoms and psa was up to 6.67   Referral done  Feeling better now after tx with bactrim      Relevant Orders   Ambulatory referral to Urology     Other   Elevated PSA    Lab Results  Component Value Date   PSA 6.67 (H) 10/21/2022   PSA 2.64 12/11/2020   PSA 2.3 11/12/2019   May be due to recent prostatitis Ref to urology for f/u      Relevant Orders   Ambulatory referral to Urology   Elevated TSH    Lab Results  Component Value Date   TSH 4.97 10/21/2022  Nl with nl FT4 now Will continue to monitor       Family history of prostate cancer   Relevant Orders   Ambulatory referral to Urology   Routine general medical examination at a health care facility - Primary    Reviewed health habits including diet and exercise and skin cancer prevention Reviewed appropriate screening tests for age  Also reviewed health mt list, fam hx and immunization status , as well as social and family history   See HPI Labs reviewed  Declines imms incl shingrix and pna Psa is up in light of recent inflammation- ref made to urol/ also fam hx Colonoscopy 02/2022 with 7 yr recall   Commended good self care

## 2022-10-28 NOTE — Patient Instructions (Addendum)
Get your eye exam as planned next month and send [place] a copy  I placed a urology referral  You can call Dr Herrick's office to schedule it   Take care of yourself! Stick to a low glycemic diet Add exercise when you can

## 2022-10-28 NOTE — Assessment & Plan Note (Signed)
Lab Results  Component Value Date   HGBA1C 6.5 10/21/2022   Improved with better diet  Continue metformin xr 500 mg daily  Continue exercise  Microalb utd Eye exam sched next mo On ace Intol of statin

## 2022-10-28 NOTE — Assessment & Plan Note (Signed)
Disc goals for lipids and reasons to control them Rev last labs with pt Rev low sat fat diet in detail Taking zetia Cannot take statin due to myopathy   LDL is under 100 now with better diet also

## 2022-10-28 NOTE — Assessment & Plan Note (Signed)
Due for f/u with urology  Had recent urinary symptoms and psa was up to 6.67   Referral done  Feeling better now after tx with bactrim

## 2022-10-28 NOTE — Assessment & Plan Note (Signed)
Lab Results  Component Value Date   PSA 6.67 (H) 10/21/2022   PSA 2.64 12/11/2020   PSA 2.3 11/12/2019    May be due to recent prostatitis Ref to urology for f/u

## 2022-10-28 NOTE — Assessment & Plan Note (Signed)
Reviewed health habits including diet and exercise and skin cancer prevention Reviewed appropriate screening tests for age  Also reviewed health mt list, fam hx and immunization status , as well as social and family history   See HPI Labs reviewed  Declines imms incl shingrix and pna Psa is up in light of recent inflammation- ref made to urol/ also fam hx Colonoscopy 02/2022 with 7 yr recall   Commended good self care

## 2022-10-28 NOTE — Assessment & Plan Note (Signed)
bp in fair control at this time  BP Readings from Last 1 Encounters:  10/28/22 128/64   No changes needed Most recent labs reviewed  Disc lifstyle change with low sodium diet and exercise  Plan to continue  Lisinopril 10 mg daily  Amlodipine 10 mg daily

## 2022-10-28 NOTE — Assessment & Plan Note (Signed)
Cannot take statin med  Does well with zetia

## 2022-10-28 NOTE — Assessment & Plan Note (Signed)
Intol of statins but taking zetia and LDL is under 100 No symptoms  Bp is also controlled

## 2022-11-04 ENCOUNTER — Ambulatory Visit: Payer: Medicare HMO | Admitting: Urology

## 2022-11-04 ENCOUNTER — Encounter: Payer: Self-pay | Admitting: Urology

## 2022-11-04 VITALS — BP 160/71 | HR 61 | Ht 68.0 in | Wt 182.0 lb

## 2022-11-04 DIAGNOSIS — N419 Inflammatory disease of prostate, unspecified: Secondary | ICD-10-CM | POA: Diagnosis not present

## 2022-11-04 DIAGNOSIS — R972 Elevated prostate specific antigen [PSA]: Secondary | ICD-10-CM | POA: Diagnosis not present

## 2022-11-04 LAB — MICROSCOPIC EXAMINATION

## 2022-11-04 LAB — URINALYSIS, COMPLETE
Bilirubin, UA: NEGATIVE
Glucose, UA: NEGATIVE
Ketones, UA: NEGATIVE
Leukocytes,UA: NEGATIVE
Nitrite, UA: NEGATIVE
Protein,UA: NEGATIVE
RBC, UA: NEGATIVE
Specific Gravity, UA: 1.02 (ref 1.005–1.030)
Urobilinogen, Ur: 0.2 mg/dL (ref 0.2–1.0)
pH, UA: 6 (ref 5.0–7.5)

## 2022-11-04 LAB — BLADDER SCAN AMB NON-IMAGING: Scan Result: 1

## 2022-11-04 MED ORDER — TAMSULOSIN HCL 0.4 MG PO CAPS: 0.8000 mg | ORAL_CAPSULE | Freq: Every day | ORAL | 0 refills | Status: DC

## 2022-11-04 NOTE — Progress Notes (Signed)
11/04/2022 11:31 AM   Jerry [name] Study [birth year] [id number]  Referring provider: Abner Greenspan, MD [address] [place],  [place] [address]  Chief Complaint  Patient presents with   Elevated PSA    HPI: Jerry [name] is a 67 y.o. male referred for evaluation of an elevated PSA.  PSA 10/21/2022 6.67 Was seen 10/10/2022 complaining of dysuria x 1.5 weeks which had progressively worsened urgency and urge incontinence Dipstick urinalysis unremarkable and urine culture was negative.  Was treated empirically with a 7-day course of Septra DS Symptoms improved but have not completely resolved Family history prostate cancer in brother Prior history of stone disease Has been on tamsulosin since ~ 2018 Baseline PSA in the mid 2 range; was 4.31 in 2017 PSA trend:    PMH: Past Medical History:  Diagnosis Date   Allergic rhinitis, cause unspecified    Asthma    Diabetes mellitus without complication ([place])    Elbow fracture age 4 or 52   left   History of kidney stones    Hx of colonic polyps    Other and unspecified hyperlipidemia    Sleep apnea    Unspecified essential hypertension     Surgical History: Past Surgical History:  Procedure Laterality Date   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   surgery   CHOLECYSTECTOMY     COLONOSCOPY WITH PROPOFOL N/A 03/11/2022   Procedure: COLONOSCOPY WITH PROPOFOL;  Surgeon: Jerry Bellows, MD;  Location: [facility] ENDOSCOPY;  Service: Gastroenterology;  Laterality: N/A;   FINGER SURGERY Right 02/2022   FRACTURE SURGERY     GANGLION CYST EXCISION Right 01/28/2022   Procedure: RIGHT SMALL FINGER MUCOUS CYST EXCISION;  Surgeon: Jerry Cooter, MD;  Location: [place];  Service: Orthopedics;  Laterality: Right;   KIDNEY STONE SURGERY     left leg fracture     TONSILLECTOMY AND ADENOIDECTOMY     2000    Home Medications:  Allergies as of 11/04/2022       Reactions   Augmentin [amoxicillin-pot Clavulanate]  Nausea And Vomiting   Crestor [rosuvastatin Calcium]    Body pain    Fluticasone Propionate    REACTION: nosebleeds        Medication List        Accurate as of November 04, 2022 11:31 AM. If you have any questions, ask your nurse or doctor.          amLODipine 10 MG tablet Commonly known as: NORVASC Take 1 tablet (10 mg total) by mouth daily.   ELDERBERRY PO Take 1 tablet by mouth daily.   ezetimibe 10 MG tablet Commonly known as: ZETIA Take 1 tablet (10 mg total) by mouth daily.   fexofenadine 180 MG tablet Commonly known as: ALLEGRA Take 180 mg by mouth daily.   lisinopril 10 MG tablet Commonly known as: ZESTRIL Take 1 tablet (10 mg total) by mouth daily.   metFORMIN 500 MG 24 hr tablet Commonly known as: GLUCOPHAGE-XR Take 1 tablet (500 mg total) by mouth daily with breakfast.   multivitamin capsule Take 1 capsule by mouth daily.   OVER THE COUNTER MEDICATION Take 1 tablet by mouth daily. Nugenix   QC TUMERIC COMPLEX PO Take by mouth.   tamsulosin 0.4 MG Caps capsule Commonly known as: FLOMAX Take 0.4 mg by mouth.   vitamin C 100 MG tablet Take 100 mg by mouth daily.   ZINC PO Take 1 tablet by mouth daily.  Allergies:  Allergies  Allergen Reactions   Augmentin [Amoxicillin-Pot Clavulanate] Nausea And Vomiting   Crestor [Rosuvastatin Calcium]     Body pain    Fluticasone Propionate     REACTION: nosebleeds    Family History: Family History  Problem Relation Age of Onset   Lung cancer Father    Diabetes Mother    Pancreatic cancer Paternal Grandfather    Prostate cancer Other    Colon cancer Neg Hx     Social History:  reports that he quit smoking about 50 years ago. His smoking use included cigarettes. He has a 1.50 pack-year smoking history. He has never used smokeless tobacco. He reports that he does not drink alcohol and does not use drugs.   Physical Exam: BP (!) 160/71   Pulse 61   Ht 5' 8"$  (1.727 m)   Wt 182 lb  (82.6 kg)   BMI 27.67 kg/m   Constitutional:  Alert and oriented, No acute distress. HEENT: [place] AT Respiratory: Normal respiratory effort, no increased work of breathing. GU: Prostate 50 g, smooth without nodules Psychiatric: Normal mood and affect.  Laboratory Data:  Lab Results  Component Value Date   PSA 6.67 (H) 10/21/2022   PSA 2.64 12/11/2020   PSA 2.3 11/12/2019   Urinalysis Dipstick/microscopy negative  Assessment & Plan:    1. Elevated PSA Benign DRE PVR 1 mL Although PSA is a prostate cancer screening test he was informed that cancer is not the most common cause of an elevated PSA. Other potential causes including BPH and inflammation were discussed. He was informed that the only way to adequately diagnose prostate cancer would be a transrectal ultrasound and biopsy of the prostate. The procedure was discussed including potential risks of bleeding and infection/sepsis. He was also informed that a negative biopsy does not conclusively rule out the possibility that prostate cancer may be present and that continued monitoring is required. The use of multiparametric prostate MRI to evaluate for abnormality suspicious for high-grade prostate cancer and aid in targeted biopsy was reviewed. Surveillance was discussed but not recommended UA today was clear.  Will increase tamsulosin to 0.8 mg x 30 days Repeat PSA 1 month and if still elevated recommend prostate MRI    Abbie Sons, MD  [facility] [address], [place], [place] [address] [phone]

## 2022-11-26 ENCOUNTER — Other Ambulatory Visit: Payer: Self-pay | Admitting: Urology

## 2022-11-26 DIAGNOSIS — G4733 Obstructive sleep apnea (adult) (pediatric): Secondary | ICD-10-CM | POA: Diagnosis not present

## 2022-12-04 ENCOUNTER — Other Ambulatory Visit: Payer: Medicare HMO

## 2022-12-04 ENCOUNTER — Encounter: Payer: Self-pay | Admitting: Urology

## 2022-12-20 DIAGNOSIS — H524 Presbyopia: Secondary | ICD-10-CM | POA: Diagnosis not present

## 2022-12-20 DIAGNOSIS — H25011 Cortical age-related cataract, right eye: Secondary | ICD-10-CM | POA: Diagnosis not present

## 2022-12-20 DIAGNOSIS — E119 Type 2 diabetes mellitus without complications: Secondary | ICD-10-CM | POA: Diagnosis not present

## 2022-12-20 DIAGNOSIS — H5203 Hypermetropia, bilateral: Secondary | ICD-10-CM | POA: Diagnosis not present

## 2022-12-26 DIAGNOSIS — G4733 Obstructive sleep apnea (adult) (pediatric): Secondary | ICD-10-CM | POA: Diagnosis not present

## 2022-12-27 ENCOUNTER — Encounter: Payer: Self-pay | Admitting: Family Medicine

## 2022-12-27 DIAGNOSIS — G4733 Obstructive sleep apnea (adult) (pediatric): Secondary | ICD-10-CM | POA: Diagnosis not present

## 2023-01-26 DIAGNOSIS — G4733 Obstructive sleep apnea (adult) (pediatric): Secondary | ICD-10-CM | POA: Diagnosis not present

## 2023-02-10 ENCOUNTER — Emergency Department: Payer: Medicare HMO

## 2023-02-10 ENCOUNTER — Emergency Department
Admission: EM | Admit: 2023-02-10 | Discharge: 2023-02-10 | Disposition: A | Discharge status: Home / Self Care | Payer: Medicare HMO | Attending: Emergency Medicine | Admitting: Emergency Medicine

## 2023-02-10 ENCOUNTER — Encounter: Payer: Self-pay | Admitting: Emergency Medicine

## 2023-02-10 ENCOUNTER — Other Ambulatory Visit: Payer: Self-pay

## 2023-02-10 DIAGNOSIS — E119 Type 2 diabetes mellitus without complications: Secondary | ICD-10-CM | POA: Diagnosis not present

## 2023-02-10 DIAGNOSIS — N3289 Other specified disorders of bladder: Secondary | ICD-10-CM | POA: Diagnosis not present

## 2023-02-10 DIAGNOSIS — R319 Hematuria, unspecified: Secondary | ICD-10-CM | POA: Diagnosis not present

## 2023-02-10 DIAGNOSIS — I10 Essential (primary) hypertension: Secondary | ICD-10-CM | POA: Diagnosis not present

## 2023-02-10 DIAGNOSIS — N3001 Acute cystitis with hematuria: Secondary | ICD-10-CM

## 2023-02-10 LAB — URINALYSIS, ROUTINE W REFLEX MICROSCOPIC
Bilirubin Urine: NEGATIVE
Glucose, UA: NEGATIVE mg/dL
Ketones, ur: NEGATIVE mg/dL
Nitrite: NEGATIVE
Protein, ur: 100 mg/dL — AB
RBC / HPF: >50 RBC/hpf (ref 0–5)
Specific Gravity, Urine: 1.019 (ref 1.005–1.030)
Squamous Epithelial / HPF: NONE SEEN /HPF (ref 0–5)
WBC, UA: >50 WBC/hpf (ref 0–5)
pH: 7 (ref 5.0–8.0)

## 2023-02-10 LAB — CBC WITH DIFFERENTIAL/PLATELET
Abs Immature Granulocytes: 0.04 10*3/uL (ref 0.00–0.07)
Basophils Absolute: 0.1 10*3/uL (ref 0.0–0.1)
Basophils Relative: 1 %
Eosinophils Absolute: 0.9 10*3/uL — ABNORMAL HIGH (ref 0.0–0.5)
Eosinophils Relative: 7 %
HCT: 40.8 % (ref 39.0–52.0)
Hemoglobin: 13.7 g/dL (ref 13.0–17.0)
Immature Granulocytes: 0 %
Lymphocytes Relative: 17 %
Lymphs Abs: 2.2 10*3/uL (ref 0.7–4.0)
MCH: 30.3 pg (ref 26.0–34.0)
MCHC: 33.6 g/dL (ref 30.0–36.0)
MCV: 90.3 fL (ref 80.0–100.0)
Monocytes Absolute: 1 10*3/uL (ref 0.1–1.0)
Monocytes Relative: 8 %
Neutro Abs: 8.5 10*3/uL — ABNORMAL HIGH (ref 1.7–7.7)
Neutrophils Relative %: 67 %
Platelets: 205 10*3/uL (ref 150–400)
RBC: 4.52 MIL/uL (ref 4.22–5.81)
RDW: 13.5 % (ref 11.5–15.5)
WBC: 12.8 10*3/uL — ABNORMAL HIGH (ref 4.0–10.5)
nRBC: 0 % (ref 0.0–0.2)

## 2023-02-10 LAB — COMPREHENSIVE METABOLIC PANEL
ALT: 15 U/L (ref 0–44)
AST: 17 U/L (ref 15–41)
Albumin: 4.1 g/dL (ref 3.5–5.0)
Alkaline Phosphatase: 41 U/L (ref 38–126)
Anion gap: 8 (ref 5–15)
BUN: 22 mg/dL (ref 8–23)
CO2: 26 mmol/L (ref 22–32)
Calcium: 9 mg/dL (ref 8.9–10.3)
Chloride: 104 mmol/L (ref 98–111)
Creatinine, Ser: 1.13 mg/dL (ref 0.61–1.24)
GFR, Estimated: >60 mL/min (ref >60)
Glucose, Bld: 132 mg/dL — ABNORMAL HIGH (ref 70–99)
Potassium: 4.1 mmol/L (ref 3.5–5.1)
Sodium: 138 mmol/L (ref 135–145)
Total Bilirubin: 1.3 mg/dL — ABNORMAL HIGH (ref 0.3–1.2)
Total Protein: 7.3 g/dL (ref 6.5–8.1)

## 2023-02-10 MED ORDER — LIDOCAINE HCL URETHRAL/MUCOSAL 2 % EX GEL
1.0000 | Freq: Once | CUTANEOUS | Status: AC
  Administered 2023-02-10: 1 via URETHRAL
  Filled 2023-02-10: qty 6

## 2023-02-10 MED ORDER — PHENAZOPYRIDINE HCL 100 MG PO TABS: 100.0000 mg | ORAL_TABLET | Freq: Three times a day (TID) | ORAL | 0 refills | Status: DC | PRN

## 2023-02-10 MED ORDER — CEPHALEXIN 500 MG PO CAPS: 500.0000 mg | ORAL_CAPSULE | Freq: Three times a day (TID) | ORAL | 0 refills | Status: DC

## 2023-02-10 MED ORDER — FENTANYL CITRATE PF 50 MCG/ML IJ SOSY
50.0000 ug | PREFILLED_SYRINGE | Freq: Once | INTRAMUSCULAR | Status: AC
  Administered 2023-02-10: 50 ug via INTRAVENOUS
  Filled 2023-02-10: qty 1

## 2023-02-10 MED ORDER — SODIUM CHLORIDE 0.9 % IV SOLN
1.0000 g | Freq: Once | INTRAVENOUS | Status: AC
  Administered 2023-02-10: 1 g via INTRAVENOUS
  Filled 2023-02-10: qty 10

## 2023-02-10 MED ORDER — ONDANSETRON HCL 4 MG/2ML IJ SOLN
4.0000 mg | Freq: Once | INTRAMUSCULAR | Status: AC
  Administered 2023-02-10: 4 mg via INTRAVENOUS
  Filled 2023-02-10: qty 2

## 2023-02-10 NOTE — ED Provider Notes (Signed)
[facility] Provider Note    Event Date/Time   First MD Initiated Contact with Patient 02/10/23 [phone]     (approximate)   History   Hematuria   HPI  Jerry [name] is a 67 y.o. male with history of hypertension, Type 2 diabetes, and as listed in EMR presents to the emergency department for treatment and evaluation of urinary retention. Yesterday, he began having dysuria. He got up a couple of times this morning to pee, but since 5:30am he has only been able to produce tiny amounts at a time. He has heavy pressure over his bladder area.  No fever, nausea, or vomiting. No back pain.     Physical Exam   Triage Vital Signs: ED Triage Vitals  Enc Vitals Group     BP 02/10/23 0936 (!) 141/74     Pulse Rate 02/10/23 0936 71     Resp 02/10/23 0936 18     Temp 02/10/23 0936 97.7 F (36.5 C)     Temp Source 02/10/23 0936 Oral     SpO2 02/10/23 0936 98 %     Weight 02/10/23 0937 180 lb (81.6 kg)     Height 02/10/23 0937 5\' 8"  (1.727 m)     Head Circumference --      Peak Flow --      Pain Score 02/10/23 0940 9     Pain Loc --      Pain Edu? --      Excl. in GC? --     Most recent vital signs: Vitals:   02/10/23 0936 02/10/23 1316  BP: (!) 141/74 138/70  Pulse: 71 68  Resp: 18 18  Temp: 97.7 F (36.5 C)   SpO2: 98% 98%    General: Awake, no distress.  CV:  Good peripheral perfusion.  Resp:  Normal effort.  Abd:  No distention.  Other:  Suprapubic tenderness.    ED Results / Procedures / Treatments   Labs (all labs ordered are listed, but only abnormal results are displayed) Labs Reviewed  URINALYSIS, ROUTINE W REFLEX MICROSCOPIC - Abnormal; Notable for the following components:      Result Value   Color, Urine AMBER (*)    APPearance CLOUDY (*)    Hgb urine dipstick MODERATE (*)    Protein, ur 100 (*)    Leukocytes,Ua MODERATE (*)    Bacteria, UA FEW (*)    All other components within normal limits  CBC WITH DIFFERENTIAL/PLATELET  - Abnormal; Notable for the following components:   WBC 12.8 (*)    Neutro Abs 8.5 (*)    Eosinophils Absolute 0.9 (*)    All other components within normal limits  COMPREHENSIVE METABOLIC PANEL - Abnormal; Notable for the following components:   Glucose, Bld 132 (*)    Total Bilirubin 1.3 (*)    All other components within normal limits  URINE CULTURE     EKG  Not indicated   RADIOLOGY  Image and radiology report reviewed and interpreted by me. Radiology report consistent with the same.  CT for renal stone study shows no outlet obstruction.  There is severe bladder wall thickening.  No hydronephrosis.  Prostateomegaly is noted.  PROCEDURES:  Critical Care performed: No  Procedures   MEDICATIONS ORDERED IN ED:  Medications  lidocaine (XYLOCAINE) 2 % jelly 1 Application (1 Application Urethral Given 02/10/23 1021)  fentaNYL (SUBLIMAZE) injection 50 mcg (50 mcg Intravenous Given 02/10/23 1204)  ondansetron (ZOFRAN) injection 4 mg (4 mg  Intravenous Given 02/10/23 1204)  cefTRIAXone (ROCEPHIN) 1 g in sodium chloride 0.9 % 100 mL IVPB (0 g Intravenous Stopped 02/10/23 1307)     IMPRESSION / MDM / ASSESSMENT AND PLAN / ED COURSE   I have reviewed the triage note.  Differential diagnosis includes, but is not limited to, acute cystitis, acute urinary retention, ureteral obstruction, obstructing stone.  Patient's presentation is most consistent with acute presentation with potential threat to life or bodily function.  67 year old male presenting to the emergency department for treatment and evaluation of urinary retention.  See HPI for further details.  2 scans of the bladder reveals less than 50 mL.  Plan will be to insert Foley to at least obtain a urine specimen and monitor output.  CT scan for renal stone study ordered.  Patient now complaining of right flank pain.  Per RN report, foley inserted with return of cloudy urine. Specimen sent to lab.  CT without concern for  obstruction or stone.  Bladder wall thickening is noted.  Urinalysis shows bacteria, greater than 50 white blood cells, moderate leukocytes, nitrate negative, with a moderate amount of hemoglobin.  Labs are pending. IV rocephin ordered. Vital signs are reassuring.  Labs show mild leukocytosis at 12.8 and an essentially normal CMP including normal BUN and creatinine and GFR is greater than 60.  Plan will be to discharge him home on Keflex and Pyridium.  He is to call and schedule follow-up appointment with his primary care provider or urologist.  ER return precautions were also discussed.  Patient was observed ambulating out of the department with an unassisted and steady gait.      FINAL CLINICAL IMPRESSION(S) / ED DIAGNOSES   Final diagnoses:  Acute cystitis with hematuria     Rx / DC Orders   ED Discharge Orders          Ordered    cephALEXin (KEFLEX) 500 MG capsule  3 times daily        02/10/23 1248    phenazopyridine (PYRIDIUM) 100 MG tablet  3 times daily PRN        02/10/23 1248             Note:  This document was prepared using Dragon voice recognition software and may include unintentional dictation errors.   Chinita Pester, FNP 02/10/23 1318    Chesley Noon, MD 02/10/23 1451

## 2023-02-10 NOTE — Discharge Instructions (Addendum)
Follow up with primary care if symptoms are not resolving.  Return to the ER for symptoms that change or worsen if unable to schedule an appointment.

## 2023-02-10 NOTE — ED Notes (Signed)
See triage note  Presents with some diff urinating  States he is voiding small amts   and noticed blood in his urine

## 2023-02-10 NOTE — ED Triage Notes (Signed)
Pt arrived via POV with several days of dysuria, pt states this morning he has had some blood in his urine, pt states he has only been able to get out small amt of urine out.   Pt does report some R flank pain, feels like bladder is full. Pt has hx of kidney stones.

## 2023-02-10 NOTE — ED Notes (Signed)
Bladder scan was performed by this Clinical research associate and [facility] ED tech. Patient c/o pressure and appeared uncomfortable during exam. Bladder scan amounts were 13ml to 20ml. Catha Gosselin, NP aware.

## 2023-02-10 NOTE — ED Notes (Signed)
[facility] ED tech at bedside. 16Fr. Foley catheter recommended by provider.

## 2023-02-12 LAB — URINE CULTURE

## 2023-02-13 ENCOUNTER — Telehealth: Payer: Self-pay | Admitting: *Deleted

## 2023-02-13 LAB — URINE CULTURE: Culture: 10000 — AB

## 2023-02-13 NOTE — Transitions of Care (Post Inpatient/ED Visit) (Signed)
02/13/2023  Name: Jerry [name] MRN: [id number] DOB: 12-20-1955  Today's TOC FU Call Status: Today's TOC FU Call Status:: Successful TOC FU Call Competed TOC FU Call Complete Date: 02/13/23  Transition Care Management Follow-up Telephone Call Date of Discharge: 02/11/23 Discharge Facility: [facility]) Type of Discharge: Emergency Department Reason for ED Visit: Other: (hematuria, cystitis) How have you been since you were released from the hospital?: Same (still burning) Any questions or concerns?: No  Items Reviewed: Did you receive and understand the discharge instructions provided?: Yes Medications obtained,verified, and reconciled?: Yes (Medications Reviewed) Any new allergies since your discharge?: No Dietary orders reviewed?: No Do you have support at home?: Yes People in Home: spouse Name of Support/Comfort Primary Source: Erskine Squibb  Medications Reviewed Today: Medications Reviewed Today     Reviewed by Luella Cook, RN (Case Manager) on 02/13/23 at 1222  Med List Status: <None>   Medication Order Taking? Sig Documenting Provider Last Dose Status Informant  amLODipine (NORVASC) 10 MG tablet [id number] Yes Take 1 tablet (10 mg total) by mouth daily. Tower, Audrie Gallus, MD Taking Active   Ascorbic Acid (VITAMIN C) 100 MG tablet [id number] Yes Take 100 mg by mouth daily. [provider] Taking Active Self  cephALEXin (KEFLEX) 500 MG capsule [id number] Yes Take 1 capsule (500 mg total) by mouth 3 (three) times daily for 7 days. Kem Boroughs B, FNP Taking Active   ELDERBERRY PO [id number] Yes Take 1 tablet by mouth daily. [provider] Taking Active Self  ezetimibe (ZETIA) 10 MG tablet [id number] Yes Take 1 tablet (10 mg total) by mouth daily. Antonieta Iba, MD Taking Active   fexofenadine [facility]) 180 MG tablet [id number] Yes Take 180 mg by mouth daily. [provider] Taking Active   lisinopril (ZESTRIL) 10 MG  tablet [id number] Yes Take 1 tablet (10 mg total) by mouth daily. Tower, Audrie Gallus, MD Taking Active   metFORMIN (GLUCOPHAGE-XR) 500 MG 24 hr tablet [id number] Yes Take 1 tablet (500 mg total) by mouth daily with breakfast. Tower, Audrie Gallus, MD Taking Active   Multiple Vitamin (MULTIVITAMIN) capsule [id number] Yes Take 1 capsule by mouth daily. [provider] Taking Active Self  Multiple Vitamins-Minerals (ZINC PO) [id number] Yes Take 1 tablet by mouth daily. [provider] Taking Active Self  OVER THE COUNTER MEDICATION [id number]  Take 1 tablet by mouth daily. Nugenix [provider]  Active Self  phenazopyridine (PYRIDIUM) 100 MG tablet [id number] Yes Take 1 tablet (100 mg total) by mouth 3 (three) times daily as needed for pain. Kem Boroughs B, FNP Taking Active   tamsulosin (FLOMAX) 0.4 MG CAPS capsule [id number] Yes TAKE 2 CAPSULES BY MOUTH EVERY DAY Stoioff, Verna Czech, MD Taking Active   Turmeric (QC TUMERIC COMPLEX PO) [id number] Yes Take by mouth. [provider] Taking Active             Home Care and Equipment/Supplies: Were Home Health Services Ordered?: NA Any new equipment or medical supplies ordered?: NA  Functional Questionnaire: Do you need assistance with bathing/showering or dressing?: No Do you need assistance with meal preparation?: No Do you need assistance with eating?: No Do you have difficulty maintaining continence: No Do you need assistance with getting out of bed/getting out of a chair/moving?: No Do you have difficulty managing or taking your medications?: No  Follow up appointments reviewed: PCP Follow-up appointment confirmed?: No MD Provider Line Number:[phone] Given: Yes (Patient does have the number but refused to  go to PCP at this time . Hospitalist stated PCP or urologist. Pt will follow up with urologist.) Specialist Hospital Follow-up appointment confirmed?: No Reason Specialist Follow-Up Not Confirmed: Patient has  Specialist Provider Number and will Call for Appointment Do you need transportation to your follow-up appointment?: No Do you understand care options if your condition(s) worsen?: Yes-patient verbalized understanding  SDOH Interventions Today    Flowsheet Row Most Recent Value  SDOH Interventions   Food Insecurity Interventions Intervention Not Indicated  Housing Interventions Intervention Not Indicated  Transportation Interventions Intervention Not Indicated      Interventions Today    Flowsheet Row Most Recent Value  General Interventions   General Interventions Discussed/Reviewed General Interventions Discussed, General Interventions Reviewed, Doctor Visits  Doctor Visits Discussed/Reviewed Doctor Visits Discussed, Doctor Visits Reviewed  Nutrition Interventions   Nutrition Discussed/Reviewed Fluid intake  [RN discussed increasing fluids]      TOC Interventions Today    Flowsheet Row Most Recent Value  TOC Interventions   TOC Interventions Discussed/Reviewed TOC Interventions Discussed, TOC Interventions Reviewed       Gean Maidens BSN RN [facility] [phone]

## 2023-02-18 ENCOUNTER — Encounter: Payer: Self-pay | Admitting: Physician Assistant

## 2023-02-18 ENCOUNTER — Ambulatory Visit: Payer: Medicare HMO | Admitting: Physician Assistant

## 2023-02-18 VITALS — BP 148/66 | HR 57 | Ht 68.0 in | Wt 181.2 lb

## 2023-02-18 DIAGNOSIS — R102 Pelvic and perineal pain: Secondary | ICD-10-CM | POA: Diagnosis not present

## 2023-02-18 DIAGNOSIS — R972 Elevated prostate specific antigen [PSA]: Secondary | ICD-10-CM

## 2023-02-18 DIAGNOSIS — N400 Enlarged prostate without lower urinary tract symptoms: Secondary | ICD-10-CM

## 2023-02-18 DIAGNOSIS — Z87440 Personal history of urinary (tract) infections: Secondary | ICD-10-CM

## 2023-02-18 DIAGNOSIS — N419 Inflammatory disease of prostate, unspecified: Secondary | ICD-10-CM | POA: Diagnosis not present

## 2023-02-18 DIAGNOSIS — R300 Dysuria: Secondary | ICD-10-CM

## 2023-02-18 LAB — URINALYSIS, COMPLETE
Bilirubin, UA: NEGATIVE
Glucose, UA: NEGATIVE
Ketones, UA: NEGATIVE
Leukocytes,UA: NEGATIVE
Nitrite, UA: NEGATIVE
Protein,UA: NEGATIVE
RBC, UA: NEGATIVE
Specific Gravity, UA: 1.025 (ref 1.005–1.030)
Urobilinogen, Ur: 0.2 mg/dL (ref 0.2–1.0)
pH, UA: 6 (ref 5.0–7.5)

## 2023-02-18 LAB — MICROSCOPIC EXAMINATION

## 2023-02-18 LAB — BLADDER SCAN AMB NON-IMAGING: Scan Result: 6

## 2023-02-18 MED ORDER — CEPHALEXIN 500 MG PO CAPS
500.0000 mg | ORAL_CAPSULE | Freq: Two times a day (BID) | ORAL | 0 refills | Status: AC
Start: 2023-02-18 — End: 2023-03-11

## 2023-02-18 NOTE — Progress Notes (Signed)
02/18/2023 1:54 PM   Jerry [name] 21-Nov-1955 [id number]  CC: Chief Complaint  Patient presents with   Follow-up   HPI: Jerry [name] is a 67 y.o. male with PMH nephrolithiasis, BPH, and fluctuating PSA in the setting of irritative voiding symptoms who presents today for evaluation of dysuria.   ED visit on 02/10/2023 with several days of dysuria, gross hematuria, and decreased urinary output.  Bladder scan showed <54mL.  UA was notable for >50 RBCs/hpf, >50 WBC/hpf, few bacteria, and WBC clumps.  Urine culture finalized with low colony counts of pansensitive E faecalis.  He was given a dose of Rocephin in the ED and discharged on Keflex 500 mg 3 times daily x 7 days.  Today he reports he finished Keflex yesterday. Symptoms have improved but not resolved. He still has some dysuria, bladder pressure, and tenderness/soreness in the perineum with squatting and sitting on hard surfaces.  He reports ~1 UTI per year over the past 5-6 years. He recalls taking Bactrim earlier this year and feels it was the most helpful medication he's taken for his symptoms.  In-office UA and microscopy today pan negative. PVR 6mL.  PMH: Past Medical History:  Diagnosis Date   Allergic rhinitis, cause unspecified    Asthma    Diabetes mellitus without complication (HCC)    Elbow fracture age 71 or 89   left   History of kidney stones    Hx of colonic polyps    Other and unspecified hyperlipidemia    Sleep apnea    Unspecified essential hypertension     Surgical History: Past Surgical History:  Procedure Laterality Date   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   surgery   CHOLECYSTECTOMY     COLONOSCOPY WITH PROPOFOL N/A 03/11/2022   Procedure: COLONOSCOPY WITH PROPOFOL;  Surgeon: Wyline Mood, MD;  Location[facility] ENDOSCOPY;  Service: Gastroenterology;  Laterality: N/A;   FINGER SURGERY Right 02/2022   FRACTURE SURGERY     GANGLION CYST EXCISION Right 01/28/2022   Procedure: RIGHT SMALL  FINGER MUCOUS CYST EXCISION;  Surgeon: Marlyne Beards, MD;  Location: [facility];  Service: Orthopedics;  Laterality: Right;   KIDNEY STONE SURGERY     left leg fracture     TONSILLECTOMY AND ADENOIDECTOMY     2000    Home Medications:  Allergies as of 02/18/2023       Reactions   Augmentin [amoxicillin-pot Clavulanate] Nausea And Vomiting   Crestor [rosuvastatin Calcium]    Body pain    Fluticasone Propionate    REACTION: nosebleeds        Medication List        Accurate as of February 18, 2023  1:54 PM. If you have any questions, ask your nurse or doctor.          amLODipine 10 MG tablet Commonly known as: NORVASC Take 1 tablet (10 mg total) by mouth daily.   ELDERBERRY PO Take 1 tablet by mouth daily.   ezetimibe 10 MG tablet Commonly known as: ZETIA Take 1 tablet (10 mg total) by mouth daily.   fexofenadine 180 MG tablet Commonly known as: ALLEGRA Take 180 mg by mouth daily.   lisinopril 10 MG tablet Commonly known as: ZESTRIL Take 1 tablet (10 mg total) by mouth daily.   metFORMIN 500 MG 24 hr tablet Commonly known as: GLUCOPHAGE-XR Take 1 tablet (500 mg total) by mouth daily with breakfast.   multivitamin capsule Take 1 capsule by mouth daily.  OVER THE COUNTER MEDICATION Take 1 tablet by mouth daily. Nugenix   phenazopyridine 100 MG tablet Commonly known as: Pyridium Take 1 tablet (100 mg total) by mouth 3 (three) times daily as needed for pain.   QC TUMERIC COMPLEX PO Take by mouth.   tamsulosin 0.4 MG Caps capsule Commonly known as: FLOMAX TAKE 2 CAPSULES BY MOUTH EVERY DAY   vitamin C 100 MG tablet Take 100 mg by mouth daily.   ZINC PO Take 1 tablet by mouth daily.        Allergies:  Allergies  Allergen Reactions   Augmentin [Amoxicillin-Pot Clavulanate] Nausea And Vomiting   Crestor [Rosuvastatin Calcium]     Body pain    Fluticasone Propionate     REACTION: nosebleeds    Family History: Family History   Problem Relation Age of Onset   Lung cancer Father    Diabetes Mother    Pancreatic cancer Paternal Grandfather    Prostate cancer Other    Colon cancer Neg Hx     Social History:   reports that he quit smoking about 50 years ago. His smoking use included cigarettes. He has a 1.50 pack-year smoking history. He has never used smokeless tobacco. He reports that he does not drink alcohol and does not use drugs.  Physical Exam: BP (!) 148/66   Pulse (!) 57   Ht 5\' 8"  (1.727 m)   Wt 181 lb 4 oz (82.2 kg)   BMI 27.56 kg/m   Constitutional:  Alert and oriented, no acute distress, nontoxic appearing HEENT: [place], AT Cardiovascular: No clubbing, cyanosis, or edema Respiratory: Normal respiratory effort, no increased work of breathing Skin: No rashes, bruises or suspicious lesions Neurologic: Grossly intact, no focal deficits, moving all 4 extremities Psychiatric: Normal mood and affect  Laboratory Data: Results for orders placed or performed in visit on 02/18/23  Microscopic Examination   Urine  Result Value Ref Range   WBC, UA 0-5 0 - 5 /hpf   RBC, Urine 0-2 0 - 2 /hpf   Epithelial Cells (non renal) 0-10 0 - 10 /hpf   Mucus, UA Present (A) Not Estab.   Bacteria, UA Few None seen/Few  Urinalysis, Complete  Result Value Ref Range   Specific Gravity, UA 1.025 1.005 - 1.030   pH, UA 6.0 5.0 - 7.5   Color, UA Yellow Yellow   Appearance Ur Clear Clear   Leukocytes,UA Negative Negative   Protein,UA Negative Negative/Trace   Glucose, UA Negative Negative   Ketones, UA Negative Negative   RBC, UA Negative Negative   Bilirubin, UA Negative Negative   Urobilinogen, Ur 0.2 0.2 - 1.0 mg/dL   Nitrite, UA Negative Negative   Microscopic Examination See below:   Bladder Scan (Post Void Residual) in office  Result Value Ref Range   Scan Result 6    Assessment & Plan:   1. Prostatitis, unspecified prostatitis type Persistent but improved dysuria and perineal discomfort after 1 week of  Keflex. I suspect underlying chronic bacterial prostatitis, which could account for his annual UTIs and fluctuating PSA. Will extend Keflex an additional 3 weeks per culture results. Will see him back in 3 weeks for symptom recheck and PSA. If he is still symptomatic or PSA remains elevated, will pursue prostate MRI. He is in agreement with this plan. - Urinalysis, Complete - Bladder Scan (Post Void Residual) in office - cephALEXin (KEFLEX) 500 MG capsule; Take 1 capsule (500 mg total) by mouth 2 (two) times daily for 21  days.  Dispense: 42 capsule; Refill: 0   Return in about 3 weeks (around 03/11/2023) for Symptom recheck, PSA.  Carman Ching, PA-C  [facility] [place] [address], [address] [place], [place] [address] [phone]

## 2023-02-26 DIAGNOSIS — G4733 Obstructive sleep apnea (adult) (pediatric): Secondary | ICD-10-CM | POA: Diagnosis not present

## 2023-03-11 ENCOUNTER — Ambulatory Visit: Payer: Medicare HMO | Admitting: Physician Assistant

## 2023-03-17 ENCOUNTER — Encounter: Payer: Self-pay | Admitting: Physician Assistant

## 2023-03-17 ENCOUNTER — Ambulatory Visit: Payer: Medicare HMO | Admitting: Physician Assistant

## 2023-03-17 ENCOUNTER — Other Ambulatory Visit: Payer: Self-pay | Admitting: Physician Assistant

## 2023-03-17 VITALS — BP 143/70 | HR 58 | Wt 185.0 lb

## 2023-03-17 DIAGNOSIS — N529 Male erectile dysfunction, unspecified: Secondary | ICD-10-CM

## 2023-03-17 DIAGNOSIS — N401 Enlarged prostate with lower urinary tract symptoms: Secondary | ICD-10-CM

## 2023-03-17 DIAGNOSIS — N419 Inflammatory disease of prostate, unspecified: Secondary | ICD-10-CM

## 2023-03-17 DIAGNOSIS — R972 Elevated prostate specific antigen [PSA]: Secondary | ICD-10-CM

## 2023-03-17 DIAGNOSIS — R350 Frequency of micturition: Secondary | ICD-10-CM

## 2023-03-17 IMAGING — CT CT CARDIAC CORONARY ARTERY CALCIUM SCORE
3 series · 14 of 20 positions shown, 16 images · non-contrast
Comparison: Chest two views 12/17/2016 and AP chest 05/07/2019

Addendum:
CLINICAL DATA: Risk stratification

EXAM:
Coronary Calcium Score
TECHNIQUE: The patient was scanned on a Siemens Somatom go.Top Scanner. Axial
non-contrast 3 mm slices were carried out through the heart. The
data set was analyzed on a dedicated work station and scored using
the Agatson method.

[Series 2: sa36 calcium scoring 3.00 · axial · 0.41mm/px · z∈[-1097,-1016]mm · 4 of 46 slices shown]
[im 10/46  vessel]
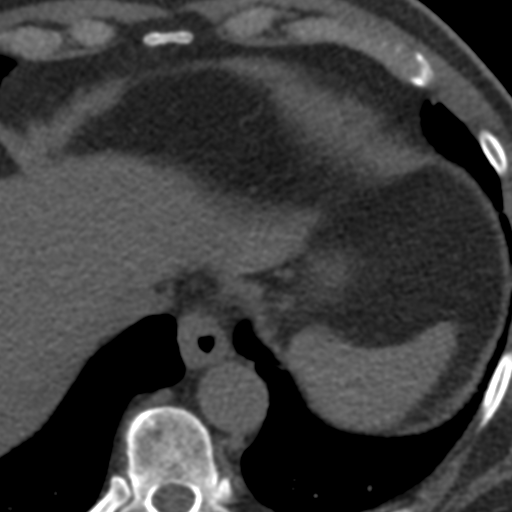
[im 19/46  vessel]
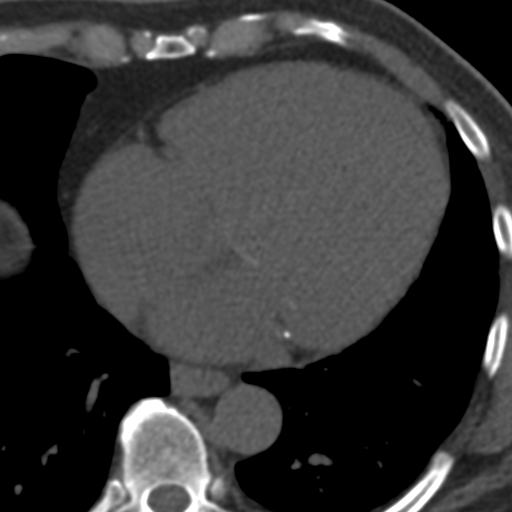
[im 28/46  vessel]
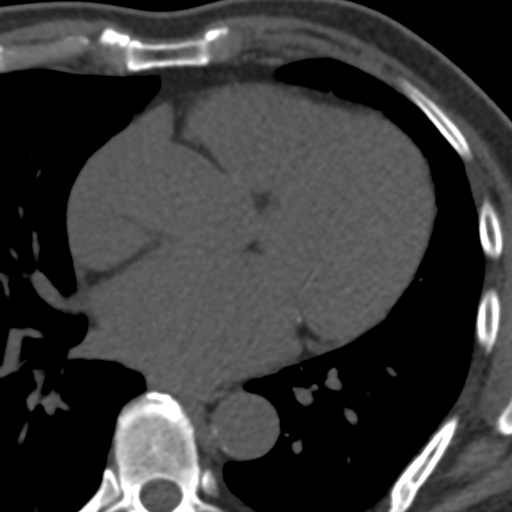
[im 37/46  vessel]
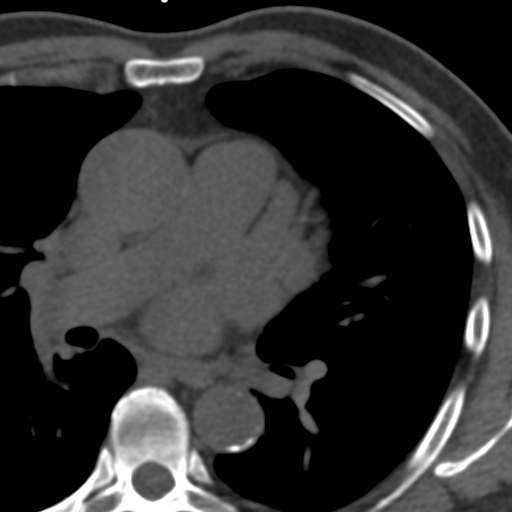

[Series 5: full fov st calcium scoring 3.00 · axial · 0.66mm/px · z∈[-1103,-1013]mm · 5 of 46 slices shown, 7 images]
[im 8/46  vessel]
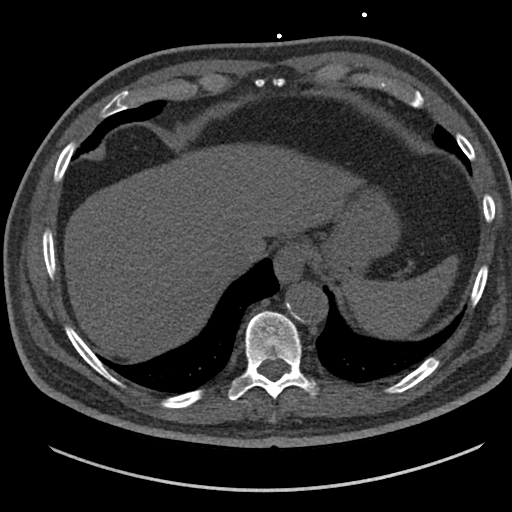
[im 8/46  lung]
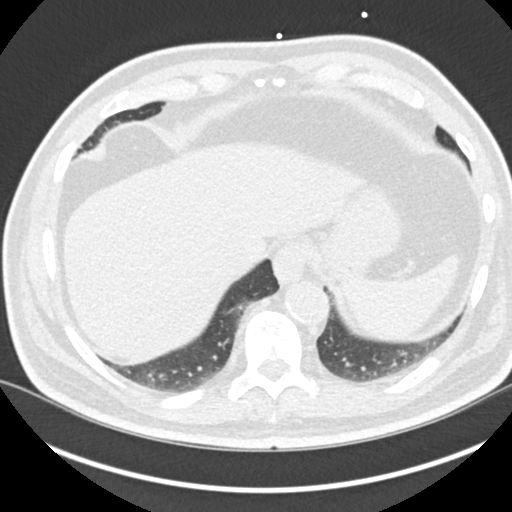
[im 16/46  vessel]
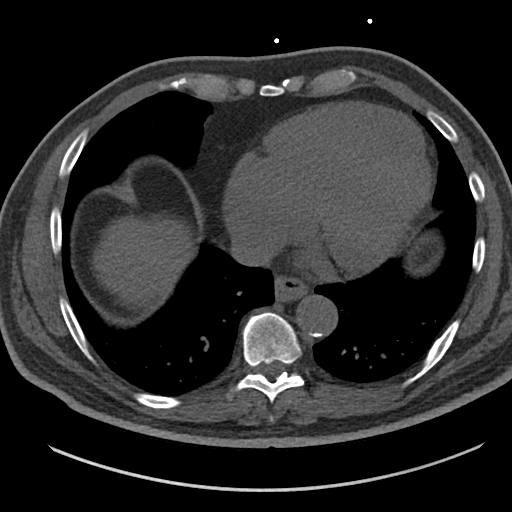
[im 23/46  vessel]
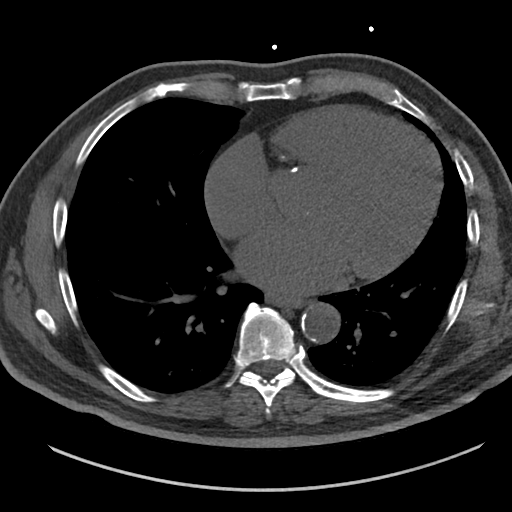
[im 31/46  vessel]
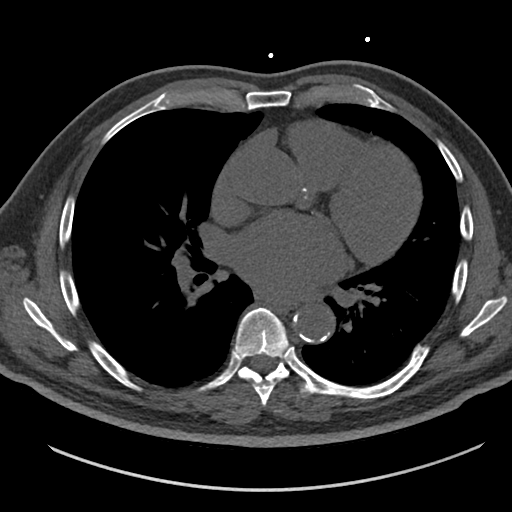
[im 38/46  vessel]
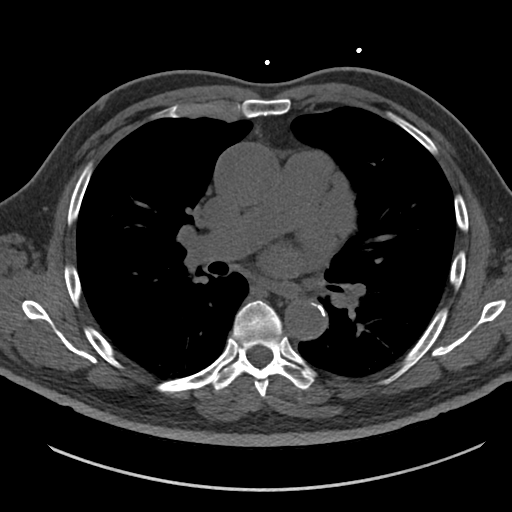
[im 38/46  lung]
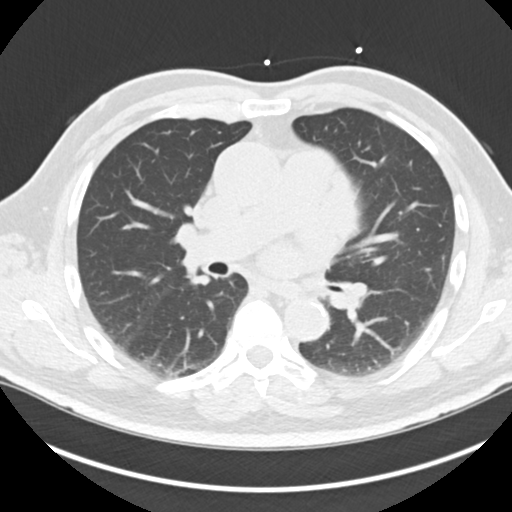

[Series 10: full fov lungs calcium scoring 3.00 ax · axial · 0.66mm/px · z∈[-1103,-1013]mm · 5 of 46 slices shown]
[im 8/46  vessel]
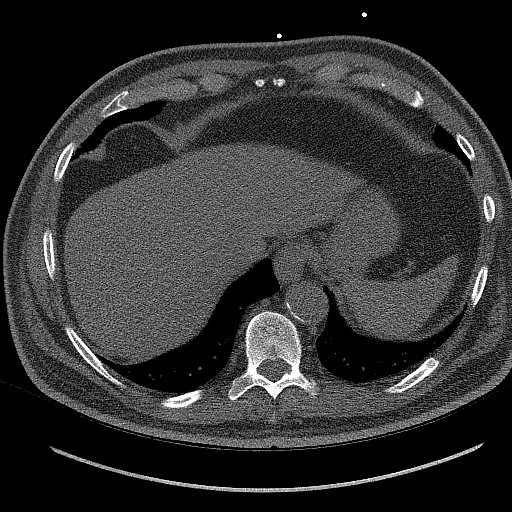
[im 16/46  vessel]
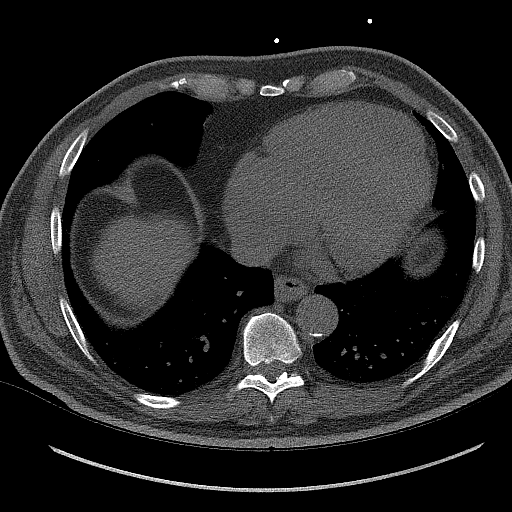
[im 23/46  vessel]
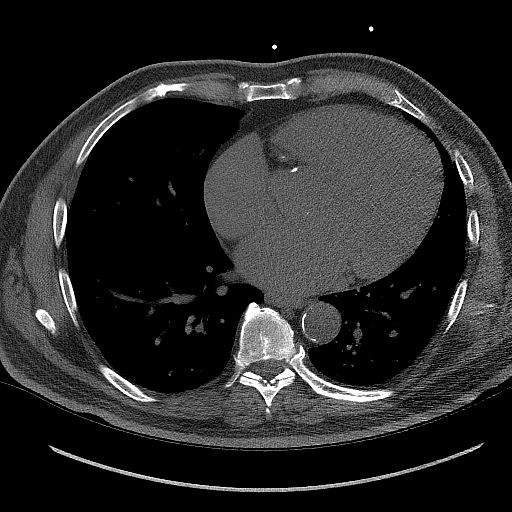
[im 31/46  vessel]
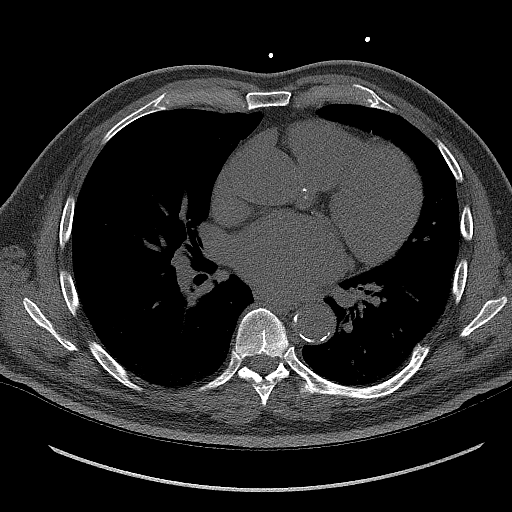
[im 38/46  vessel]
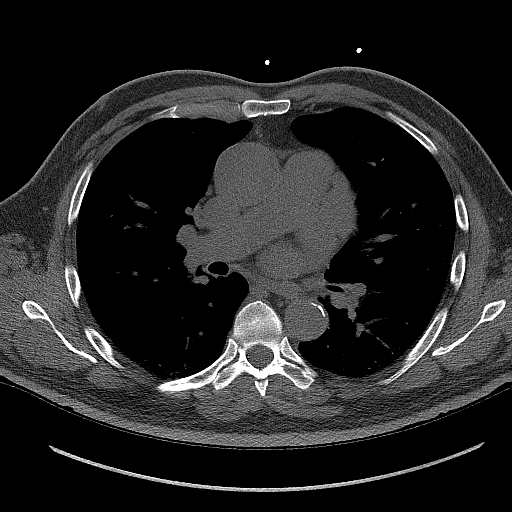

[14 of 20 positions shown; findings below may reference images not displayed]

FINDINGS: Non-cardiac: See separate report from [REDACTED].

Ascending Aorta: Normal size aortic root and descending aorta
calcifications.

Pericardium: Normal

Coronary arteries: Normal origin of left and right coronary
arteries. Distribution of arterial calcifications if present, as
noted below;

LM

LAD 211

LCx 124

RCA

Total 383

IMPRESSION AND RECOMMENDATION:
1. Coronary calcium score of 383. This was 76th percentile for age
and sex matched control.

2. CAC >300 in LM, LAD, LCx, RCA. ASUSENA BERNA3/N3.

3. Recommend aspirin and statin if no contraindications.

4. Recommend cardiology consultation.

5. Continue heart healthy lifestyle and risk factor modification.

EXAM:
OVER-READ INTERPRETATION  CT CHEST

The following report is an over-read performed by radiologist Dr.
Wendy Marie Norseng [REDACTED] on 02/25/2022. This
over-read does not include interpretation of cardiac or coronary
anatomy or pathology. The coronary calcium score interpretation by
the cardiologist is attached.
FINDINGS: Cardiovascular: The ascending aorta measures up to 4.1 x 4.2 cm
(transverse by AP) in caliber mildly aneurysmal. Mild-to-moderate
partially visualized atherosclerotic calcifications within the
descending thoracic aorta.

Mediastinum/Nodes: There are no enlarged lymph nodes within the
visualized mediastinum.

Lungs/Pleura: There is no pleural effusion. Mild bilateral posterior
lung subpleural dependent ground-glass subsegmental atelectasis.

Upper abdomen: No significant findings in the visualized upper
abdomen.

Musculoskeletal/Chest wall: Mild-to-moderate disc space air gated
anterior bridging osteophytes of the visualized mid to lower
thoracic spine. There are multiple old healed inferior posterior
left rib fractures.
IMPRESSION: Ascending thoracic aorta measures up to 4.2 cm in caliber,
borderline aneurysmal. Recommend annual imaging followup by CTA or
MRA. This recommendation follows 0787
ACCF/AHA/AATS/ACR/ASA/SCA/CEEJAY/VETTER/DESOUSA/PESTHY Guidelines for the
Diagnosis and Management of Patients with Thoracic Aortic Disease.
Circulation. 0787; 121: [id number]. Aortic aneurysm NOS ([id number])

*** End of Addendum ***
FINDINGS: Non-cardiac: See separate report from [REDACTED].

Ascending Aorta: Normal size aortic root and descending aorta
calcifications.

Pericardium: Normal

Coronary arteries: Normal origin of left and right coronary
arteries. Distribution of arterial calcifications if present, as
noted below;

LM

LAD 211

LCx 124

RCA

Total 383

IMPRESSION AND RECOMMENDATION:
1. Coronary calcium score of 383. This was 76th percentile for age
and sex matched control.

2. CAC >300 in LM, LAD, LCx, RCA. ASUSENA BERNA3/N3.

3. Recommend aspirin and statin if no contraindications.

4. Recommend cardiology consultation.

5. Continue heart healthy lifestyle and risk factor modification.

## 2023-03-17 MED ORDER — TADALAFIL 5 MG PO TABS
5.0000 mg | ORAL_TABLET | Freq: Every day | ORAL | 11 refills | Status: DC
Start: 2023-03-17 — End: 2023-03-19

## 2023-03-17 NOTE — Progress Notes (Signed)
03/17/2023 4:50 PM   Jerry [name] 1956/07/12 [id number]  CC: Chief Complaint  Patient presents with   Prostatitis   HPI: Jerry [name] is a 67 y.o. male with PMH nephrolithiasis, BPH, and fluctuating PSA in the setting of irritative voiding symptoms who presents today for symptom recheck after completing 4 weeks of antibiotics for bacterial prostatitis.   Today he reports his dysuria is 90 to 95% improved.  His perineal fullness/discomfort has resolved.  He completed antibiotics about 5 days ago.  He denies fever, chills, nausea, or vomiting.  He also reports an approximate 4 to 5-year history of premature detumescence.  He is able to achieve spontaneous erections, however they do not last as long as he would like.  His libido remains intact.  He reports that he stopped drinking caffeine about 6 months ago due to marked increase in urinary frequency after consuming coffee and chocolate.  He denies baseline urinary frequency, but states after consuming these offending agents he will have to void every 15 minutes.  PMH: Past Medical History:  Diagnosis Date   Allergic rhinitis, cause unspecified    Asthma    Diabetes mellitus without complication (HCC)    Elbow fracture age 32 or 54   left   History of kidney stones    Hx of colonic polyps    Other and unspecified hyperlipidemia    Sleep apnea    Unspecified essential hypertension     Surgical History: Past Surgical History:  Procedure Laterality Date   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   surgery   CHOLECYSTECTOMY     COLONOSCOPY WITH PROPOFOL N/A 03/11/2022   Procedure: COLONOSCOPY WITH PROPOFOL;  Surgeon: Wyline Mood, MD;  [facility] ENDOSCOPY;  Service: Gastroenterology;  Laterality: N/A;   FINGER SURGERY Right 02/2022   FRACTURE SURGERY     GANGLION CYST EXCISION Right 01/28/2022   Procedure: RIGHT SMALL FINGER MUCOUS CYST EXCISION;  Surgeon: Marlyne Beards, MD;  Location: [facility];   Service: Orthopedics;  Laterality: Right;   KIDNEY STONE SURGERY     left leg fracture     TONSILLECTOMY AND ADENOIDECTOMY     2000    Home Medications:  Allergies as of 03/17/2023       Reactions   Augmentin [amoxicillin-pot Clavulanate] Nausea And Vomiting   Crestor [rosuvastatin Calcium]    Body pain    Fluticasone Propionate    REACTION: nosebleeds        Medication List        Accurate as of March 17, 2023  4:50 PM. If you have any questions, ask your nurse or doctor.          amLODipine 10 MG tablet Commonly known as: NORVASC Take 1 tablet (10 mg total) by mouth daily.   ELDERBERRY PO Take 1 tablet by mouth daily.   ezetimibe 10 MG tablet Commonly known as: ZETIA Take 1 tablet (10 mg total) by mouth daily.   fexofenadine 180 MG tablet Commonly known as: ALLEGRA Take 180 mg by mouth daily.   lisinopril 10 MG tablet Commonly known as: ZESTRIL Take 1 tablet (10 mg total) by mouth daily.   metFORMIN 500 MG 24 hr tablet Commonly known as: GLUCOPHAGE-XR Take 1 tablet (500 mg total) by mouth daily with breakfast.   multivitamin capsule Take 1 capsule by mouth daily.   OVER THE COUNTER MEDICATION Take 1 tablet by mouth daily. Nugenix   phenazopyridine 100 MG tablet Commonly known as: Pyridium Take  1 tablet (100 mg total) by mouth 3 (three) times daily as needed for pain.   QC TUMERIC COMPLEX PO Take by mouth.   tadalafil 5 MG tablet Commonly known as: CIALIS Take 1 tablet (5 mg total) by mouth daily. Started by: Carman Ching, PA-C   tamsulosin 0.4 MG Caps capsule Commonly known as: FLOMAX TAKE 2 CAPSULES BY MOUTH EVERY DAY   vitamin C 100 MG tablet Take 100 mg by mouth daily.   ZINC PO Take 1 tablet by mouth daily.        Allergies:  Allergies  Allergen Reactions   Augmentin [Amoxicillin-Pot Clavulanate] Nausea And Vomiting   Crestor [Rosuvastatin Calcium]     Body pain    Fluticasone Propionate     REACTION: nosebleeds     Family History: Family History  Problem Relation Age of Onset   Lung cancer Father    Diabetes Mother    Pancreatic cancer Paternal Grandfather    Prostate cancer Other    Colon cancer Neg Hx     Social History:   reports that he quit smoking about 50 years ago. His smoking use included cigarettes. He has a 1.50 pack-year smoking history. He has never used smokeless tobacco. He reports that he does not drink alcohol and does not use drugs.  Physical Exam: BP (!) 143/70   Pulse (!) 58   Wt 185 lb (83.9 kg)   BMI 28.13 kg/m   Constitutional:  Alert and oriented, no acute distress, nontoxic appearing HEENT: [place], AT Cardiovascular: No clubbing, cyanosis, or edema Respiratory: Normal respiratory effort, no increased work of breathing Skin: No rashes, bruises or suspicious lesions Neurologic: Grossly intact, no focal deficits, moving all 4 extremities Psychiatric: Normal mood and affect  Assessment & Plan:   1. Prostatitis, unspecified prostatitis type Symptoms significantly improved after 4 weeks of antibiotic therapy, however he is still having some mild persistent dysuria.  As per my previous note, will pursue MR prostate to rule out underlying abscess. - MR PROSTATE W WO CONTRAST; Future  2. Erectile dysfunction, unspecified erectile dysfunction type 4 to 5 years of ED with premature detumescence, libido intact.  With concurrent BPH with urinary frequency as below, I offered him cotreatment with low-dose daily tadalafil and he accepted. - tadalafil (CIALIS) 5 MG tablet; Take 1 tablet (5 mg total) by mouth daily.  Dispense: 30 tablet; Refill: 11  3. Benign prostatic hyperplasia with urinary frequency See #2 above. - tadalafil (CIALIS) 5 MG tablet; Take 1 tablet (5 mg total) by mouth daily.  Dispense: 30 tablet; Refill: 11  4. Elevated PSA Will recheck PSA today after completing 4 weeks of antibiotics for prostatitis as above. - PSA  Return for Will call with  results.  Carman Ching, PA-C  [facility] [place] [address], [address] [place], [place] [address] [phone]

## 2023-03-18 LAB — PSA: Prostate Specific Ag, Serum: 3.1 ng/mL (ref 0.0–4.0)

## 2023-03-18 NOTE — Telephone Encounter (Signed)
PA was started.

## 2023-03-19 MED ORDER — TADALAFIL 5 MG PO TABS
5.0000 mg | ORAL_TABLET | Freq: Every day | ORAL | 11 refills | Status: DC
Start: 2023-03-19 — End: 2024-06-14

## 2023-03-19 NOTE — Addendum Note (Signed)
Addended byRanda Lynn on: 03/19/2023 01:51 PM   Modules accepted: Orders

## 2023-03-19 NOTE — Telephone Encounter (Signed)
PA was approved. 

## 2023-03-26 ENCOUNTER — Other Ambulatory Visit: Payer: Self-pay | Admitting: Family Medicine

## 2023-03-26 NOTE — Telephone Encounter (Signed)
Give by cardiology in the past ok to fill?

## 2023-03-27 ENCOUNTER — Ambulatory Visit
Admission: RE | Admit: 2023-03-27 | Discharge: 2023-03-27 | Disposition: A | Discharge status: Home / Self Care | Payer: Medicare HMO | Source: Ambulatory Visit | Attending: Physician Assistant | Admitting: Physician Assistant

## 2023-03-27 DIAGNOSIS — N400 Enlarged prostate without lower urinary tract symptoms: Secondary | ICD-10-CM | POA: Diagnosis not present

## 2023-03-27 DIAGNOSIS — N419 Inflammatory disease of prostate, unspecified: Secondary | ICD-10-CM | POA: Insufficient documentation

## 2023-03-27 MED ORDER — GADOBUTROL 1 MMOL/ML IV SOLN
8.0000 mL | Freq: Once | INTRAVENOUS | Status: AC | PRN
  Administered 2023-03-27: 8 mL via INTRAVENOUS

## 2023-04-12 ENCOUNTER — Other Ambulatory Visit: Payer: Self-pay | Admitting: Urology

## 2023-04-12 ENCOUNTER — Other Ambulatory Visit: Payer: Self-pay | Admitting: Cardiovascular Disease

## 2023-04-12 ENCOUNTER — Other Ambulatory Visit: Payer: Self-pay | Admitting: Physician Assistant

## 2023-04-12 DIAGNOSIS — N419 Inflammatory disease of prostate, unspecified: Secondary | ICD-10-CM

## 2023-04-14 NOTE — Telephone Encounter (Signed)
Doe Dr. Mariah Milling want to refill med or defer to PCP?  Last visit on 02/20/22 f/u plan as needed,  This Med was started based on CT cardiac score on 6/12

## 2023-04-22 ENCOUNTER — Encounter: Payer: Self-pay | Admitting: *Deleted

## 2023-04-22 ENCOUNTER — Ambulatory Visit (INDEPENDENT_AMBULATORY_CARE_PROVIDER_SITE_OTHER): Payer: Medicare HMO

## 2023-04-22 VITALS — Ht 69.0 in | Wt 186.0 lb

## 2023-04-22 DIAGNOSIS — E119 Type 2 diabetes mellitus without complications: Secondary | ICD-10-CM | POA: Diagnosis not present

## 2023-04-22 DIAGNOSIS — Z0000 Encounter for general adult medical examination without abnormal findings: Secondary | ICD-10-CM | POA: Diagnosis not present

## 2023-04-22 NOTE — Progress Notes (Signed)
Subjective:   Jerry [name] is a 67 y.o. male who presents for Medicare Annual/Subsequent preventive examination.  Visit Complete: Virtual  I connected with  Jerry [name] on 04/22/23 by a audio enabled telemedicine application and verified that I am speaking with the correct person using two identifiers.  Patient Location: Home  Provider Location: Office/Clinic  I discussed the limitations of evaluation and management by telemedicine. The patient expressed understanding and agreed to proceed.  Patient Medicare AWV questionnaire was completed by the patient on 04/16/23; I have confirmed that all information answered by patient is correct and no changes since this date.  Vital Signs: Unable to obtain new vitals due to this being a telehealth visit.   Review of Systems      Cardiac Risk Factors include: advanced age (>31men, >34 women);hypertension;dyslipidemia;male gender;sedentary lifestyle;smoking/ tobacco exposure;diabetes mellitus     Objective:    Today's Vitals   04/22/23 1000  Weight: 186 lb (84.4 kg)  Height: 5\' 9"  (1.753 m)   Body mass index is 27.47 kg/m.     04/22/2023   10:08 AM 02/10/2023    9:42 AM 03/11/2022    7:01 AM 01/28/2022   11:37 AM 01/22/2022    9:41 AM 01/18/2022    1:41 PM 06/21/2021    5:06 PM  Advanced Directives  Does Patient Have a Medical Advance Directive? No No No  No No No  Would patient like information on creating a medical advance directive? No - Patient declined No - Patient declined  No - Patient declined  No - Patient declined Yes (ED - Information included in AVS)    Current Medications (verified) Outpatient Encounter Medications as of 04/22/2023  Medication Sig   amLODipine (NORVASC) 10 MG tablet Take 1 tablet (10 mg total) by mouth daily.   Ascorbic Acid (VITAMIN C) 100 MG tablet Take 100 mg by mouth daily.   ELDERBERRY PO Take 1 tablet by mouth daily.   ezetimibe (ZETIA) 10 MG tablet TAKE 1 TABLET BY MOUTH EVERY DAY    fexofenadine (ALLEGRA) 180 MG tablet Take 180 mg by mouth daily.   lisinopril (ZESTRIL) 10 MG tablet Take 1 tablet (10 mg total) by mouth daily.   metFORMIN (GLUCOPHAGE-XR) 500 MG 24 hr tablet Take 1 tablet (500 mg total) by mouth daily with breakfast.   Multiple Vitamin (MULTIVITAMIN) capsule Take 1 capsule by mouth daily.   Multiple Vitamins-Minerals (ZINC PO) Take 1 tablet by mouth daily.   OVER THE COUNTER MEDICATION Take 1 tablet by mouth daily. Nugenix   tadalafil (CIALIS) 5 MG tablet Take 1 tablet (5 mg total) by mouth daily.   tamsulosin (FLOMAX) 0.4 MG CAPS capsule TAKE 2 CAPSULES BY MOUTH EVERY DAY   Turmeric (QC TUMERIC COMPLEX PO) Take by mouth.   phenazopyridine (PYRIDIUM) 100 MG tablet Take 1 tablet (100 mg total) by mouth 3 (three) times daily as needed for pain. (Patient not taking: Reported on 04/22/2023)   No facility-administered encounter medications on file as of 04/22/2023.    Allergies (verified) Augmentin [amoxicillin-pot clavulanate], Crestor [rosuvastatin calcium], and Fluticasone propionate   History: Past Medical History:  Diagnosis Date   Allergic rhinitis, cause unspecified    Asthma    Diabetes mellitus without complication (HCC)    Elbow fracture age 60 or 75   left   History of kidney stones    Hx of colonic polyps    Other and unspecified hyperlipidemia    Sleep apnea    Unspecified essential  hypertension    Past Surgical History:  Procedure Laterality Date   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   surgery   CHOLECYSTECTOMY     COLONOSCOPY WITH PROPOFOL N/A 03/11/2022   Procedure: COLONOSCOPY WITH PROPOFOL;  Surgeon: Wyline Mood, MD;  Location: [facility] ENDOSCOPY;  Service: Gastroenterology;  Laterality: N/A;   FINGER SURGERY Right 02/2022   FRACTURE SURGERY     GANGLION CYST EXCISION Right 01/28/2022   Procedure: RIGHT SMALL FINGER MUCOUS CYST EXCISION;  Surgeon: Marlyne Beards, MD;  Location: [facility];  Service: Orthopedics;   Laterality: Right;   KIDNEY STONE SURGERY     left leg fracture     TONSILLECTOMY AND ADENOIDECTOMY     2000   Family History  Problem Relation Age of Onset   Lung cancer Father    Diabetes Mother    Pancreatic cancer Paternal Grandfather    Prostate cancer Other    Colon cancer Neg Hx    Social History   Socioeconomic History   Marital status: Married    Spouse name: Not on file   Number of children: 3   Years of education: Not on file   Highest education level: Not on file  Occupational History   Occupation: Pastor  Tobacco Use   Smoking status: Former    Current packs/day: 0.00    Average packs/day: 0.5 packs/day for 3.0 years (1.5 ttl pk-yrs)    Types: Cigarettes    Start date: 09/16/1969    Quit date: 09/16/1972    Years since quitting: 50.6   Smokeless tobacco: Never  Vaping Use   Vaping status: Never Used  Substance and Sexual Activity   Alcohol use: No    Alcohol/week: 0.0 standard drinks of alcohol   Drug use: No   Sexual activity: Not Currently  Other Topics Concern   Not on file  Social History Narrative   Not on file   Social Determinants of Health   Financial Resource Strain: Low Risk  (04/22/2023)   Overall Financial Resource Strain (CARDIA)    Difficulty of Paying Living Expenses: Not hard at all  Food Insecurity: No Food Insecurity (04/22/2023)   Hunger Vital Sign    Worried About Running Out of Food in the Last Year: Never true    Ran Out of Food in the Last Year: Never true  Transportation Needs: No Transportation Needs (04/22/2023)   PRAPARE - Administrator, Civil Service (Medical): No    Lack of Transportation (Non-Medical): No  Physical Activity: Insufficiently Active (04/22/2023)   Exercise Vital Sign    Days of Exercise per Week: 2 days    Minutes of Exercise per Session: 30 min  Stress: No Stress Concern Present (04/22/2023)   [employer] of Occupational Health - Occupational Stress Questionnaire    Feeling of Stress : Not at  all  Social Connections: Socially Integrated (04/22/2023)   Social Connection and Isolation Panel [NHANES]    Frequency of Communication with Friends and Family: More than three times a week    Frequency of Social Gatherings with Friends and Family: More than three times a week    Attends Religious Services: More than 4 times per year    Active Member of [employer] or Organizations: Yes    Attends Engineer, structural: More than 4 times per year    Marital Status: Married    Tobacco Counseling Counseling given: Not Answered   Clinical Intake:  Pre-visit preparation completed: Yes  Pain :  No/denies pain     BMI - recorded: 27.47 Nutritional Status: BMI 25 -29 Overweight Nutritional Risks: None Diabetes: Yes CBG done?: No (pt does not check) Did pt. bring in CBG monitor from home?: No  How often do you need to have someone help you when you read instructions, pamphlets, or other written materials from your doctor or pharmacy?: 1 - Never  Interpreter Needed?: No  Information entered by :: C.Tanveer Brammer LPN   Activities of Daily Living    04/16/2023   10:04 AM  In your present state of health, do you have any difficulty performing the following activities:  Hearing? 0  Vision? 0  Difficulty concentrating or making decisions? 0  Walking or climbing stairs? 0  Dressing or bathing? 0  Doing errands, shopping? 0  Preparing Food and eating ? N  Using the Toilet? N  In the past six months, have you accidently leaked urine? N  Do you have problems with loss of bowel control? N  Managing your Medications? N  Managing your Finances? N  Housekeeping or managing your Housekeeping? N    Patient Care Team: [place], Audrie Gallus, MD as PCP - General Mariah Milling, Tollie Pizza, MD as Consulting Physician (Cardiology)  Indicate any recent Medical Services you may have received from other than [facility] providers in the past year (date may be approximate).     Assessment:   This is a routine  wellness examination for Trig.  Hearing/Vision screen Hearing Screening - Comments:: Denies hearing difficulties   Vision Screening - Comments:: Glasses - [facility] - UTD on eye exams  Dietary issues and exercise activities discussed:     Goals Addressed             This Visit's Progress    Patient Stated       Lose weight       Depression Screen    04/22/2023   10:07 AM 10/28/2022    8:59 AM 08/02/2022   10:33 AM 01/22/2022    9:35 AM 12/18/2020    9:30 AM 11/12/2019    4:02 PM 07/27/2018    9:07 AM  PHQ 2/9 Scores  PHQ - 2 Score 0 0 0 0 0 0 0  PHQ- 9 Score  1    1     Fall Risk    04/16/2023   10:04 AM 10/28/2022    8:59 AM 08/02/2022   10:32 AM 01/22/2022    9:32 AM 12/18/2020    9:30 AM  Fall Risk   Falls in the past year? 0 0 0 0 0  Number falls in past yr: 0 0 0 0   Injury with Fall? 0 0 0 0   Risk for fall due to : No Fall Risks No Fall Risks No Fall Risks    Follow up Falls evaluation completed;Falls prevention discussed Falls evaluation completed Falls evaluation completed Falls evaluation completed;Education provided;Falls prevention discussed Falls evaluation completed    MEDICARE RISK AT HOME:   TIMED UP AND GO:  Was the test performed?  No    Cognitive Function:        04/22/2023   10:09 AM 01/22/2022    9:33 AM  6CIT Screen  What Year? 0 points 0 points  What month? 0 points 0 points  What time? 0 points 0 points  Count back from 20 0 points 0 points  Months in reverse 0 points 0 points  Repeat phrase 0 points 0 points  Total Score 0 points  0 points    Immunizations Immunization History  Administered Date(s) Administered   Influenza Split 08/05/2011, 10/23/2012, 09/04/2015   Influenza Whole 07/10/2005, 06/21/2008   Influenza,inj,Quad PF,6+ Mos 07/04/2017, 07/27/2018   Pneumococcal Polysaccharide-23 08/14/2005   Td 01/14/2002   Tdap 08/05/2011, 05/07/2019, 06/21/2021    TDAP status: Up to date  Flu Vaccine status: Declined,  Education has been provided regarding the importance of this vaccine but patient still declined. Advised may receive this vaccine at local pharmacy or Health Dept. Aware to provide a copy of the vaccination record if obtained from local pharmacy or Health Dept. Verbalized acceptance and understanding.  Pneumococcal vaccine status: Declined,  Education has been provided regarding the importance of this vaccine but patient still declined. Advised may receive this vaccine at local pharmacy or Health Dept. Aware to provide a copy of the vaccination record if obtained from local pharmacy or Health Dept. Verbalized acceptance and understanding.   Covid-19 vaccine status: Declined, Education has been provided regarding the importance of this vaccine but patient still declined. Advised may receive this vaccine at local pharmacy or Health Dept.or vaccine clinic. Aware to provide a copy of the vaccination record if obtained from local pharmacy or Health Dept. Verbalized acceptance and understanding.  Qualifies for Shingles Vaccine? Yes   Zostavax completed No   Shingrix Completed?: No.    Education has been provided regarding the importance of this vaccine. Patient has been advised to call insurance company to determine out of pocket expense if they have not yet received this vaccine. Advised may also receive vaccine at local pharmacy or Health Dept. Verbalized acceptance and understanding.  Screening Tests Health Maintenance  Topic Date Due   OPHTHALMOLOGY EXAM  12/20/2022   HEMOGLOBIN A1C  04/21/2023   Diabetic kidney evaluation - Urine ACR  04/27/2023   COVID-19 Vaccine (1 - 2023-24 season) 05/08/2023 (Originally 05/17/2022)   FOOT EXAM  05/23/2023 (Originally 06/18/2022)   Zoster Vaccines- Shingrix (1 of 2) 07/23/2023 (Originally 07/28/2005)   Pneumonia Vaccine 29+ Years old (2 of 2 - PCV) 10/29/2023 (Originally 07/28/2020)   Hepatitis C Screening  11/08/2023 (Originally 07[birth year]28/1973)   INFLUENZA VACCINE   12/15/2023 (Originally 04/17/2023)   Diabetic kidney evaluation - eGFR measurement  02/10/2024   Medicare Annual Wellness (AWV)  04/21/2024   Colonoscopy  03/11/2029   DTaP/Tdap/Td (5 - Td or Tdap) 06/22/2031   HPV VACCINES  Aged Out    Health Maintenance  Health Maintenance Due  Topic Date Due   OPHTHALMOLOGY EXAM  12/20/2022   HEMOGLOBIN A1C  04/21/2023   Diabetic kidney evaluation - Urine ACR  04/27/2023    Colorectal cancer screening: Type of screening: Colonoscopy. Completed 03/11/22. Repeat every 7 years  Lung Cancer Screening: (Low Dose CT Chest recommended if Age 71-80 years, 20 pack-year currently smoking OR have quit w/in 15years.) does not qualify.   Lung Cancer Screening Referral: no  Additional Screening:  Hepatitis C Screening: does qualify; Completed DUE  Vision Screening: Recommended annual ophthalmology exams for early detection of glaucoma and other disorders of the eye. Is the patient up to date with their annual eye exam?  Yes  Who is the provider or what is the name of the office in which the patient attends annual eye exams? [facility] If pt is not established with a provider, would they like to be referred to a provider to establish care? Yes .   Dental Screening: Recommended annual dental exams for proper oral hygiene  Diabetic Foot Exam: Diabetic Foot  Exam: Completed 06/19/23 pt stated that his PCP performs his foot exams.  Community Resource Referral / Chronic Care Management: CRR required this visit?  No   CCM required this visit?  No     Plan:     I have personally reviewed and noted the following in the patient's chart:   Medical and social history Use of alcohol, tobacco or illicit drugs  Current medications and supplements including opioid prescriptions. Patient is not currently taking opioid prescriptions. Functional ability and status Nutritional status Physical activity Advanced directives List of other  physicians Hospitalizations, surgeries, and ER visits in previous 12 months Vitals Screenings to include cognitive, depression, and falls Referrals and appointments  In addition, I have reviewed and discussed with patient certain preventive protocols, quality metrics, and best practice recommendations. A written personalized care plan for preventive services as well as general preventive health recommendations were provided to patient.     Maryan Puls, LPN   [id number]/8118   After Visit Summary: (MyChart) Due to this being a telephonic visit, the after visit summary with patients personalized plan was offered to patient via MyChart   Nurse Notes: Vaccinations: declines all Influenza vaccine: recommend every Fall Pneumococcal vaccine: recommend once per lifetime Prevnar-20 Shingles vaccine: recommend Shingrix which is 2 doses 2-6 months apart and over 90% effective     Covid-19: recommend 2 doses one month apart with a booster 6 months later   Order placed for diabetic eye exam.

## 2023-04-22 NOTE — Patient Instructions (Signed)
[name] , Thank you for taking time to come for your Medicare Wellness Visit. I appreciate your ongoing commitment to your health goals. Please review the following plan we discussed and let me know if I can assist you in the future.   Referrals/Orders/Follow-Ups/Clinician Recommendations: Aim for 30 minutes of exercise or brisk walking, 6-8 glasses of water, and 5 servings of fruits and vegetables each day.   This is a list of the screening recommended for you and due dates:  Health Maintenance  Topic Date Due   Zoster (Shingles) Vaccine (1 of 2) Never done   COVID-19 Vaccine (1 - 2023-24 season) Never done   Complete foot exam   06/18/2022   Eye exam for diabetics  12/20/2022   Medicare Annual Wellness Visit  01/23/2023   Flu Shot  04/17/2023   Hemoglobin A1C  04/21/2023   Yearly kidney health urinalysis for diabetes  04/27/2023   Pneumonia Vaccine (2 of 2 - PCV) 10/29/2023*   Hepatitis C Screening  11/08/2023*   Yearly kidney function blood test for diabetes  02/10/2024   Colon Cancer Screening  03/11/2029   DTaP/Tdap/Td vaccine (5 - Td or Tdap) 06/22/2031   HPV Vaccine  Aged Out  *Topic was postponed. The date shown is not the original due date.    Advanced directives: (Declined) Advance directive discussed with you today. Even though you declined this today, please call our office should you change your mind, and we can give you the proper paperwork for you to fill out.  Next Medicare Annual Wellness Visit scheduled for next year: Yes  Preventive Care 70 Years and Older, Male  Preventive care refers to lifestyle choices and visits with your health care provider that can promote health and wellness. What does preventive care include? A yearly physical exam. This is also called an annual well check. Dental exams once or twice a year. Routine eye exams. Ask your health care provider how often you should have your eyes checked. Personal lifestyle choices, including: Daily  care of your teeth and gums. Regular physical activity. Eating a healthy diet. Avoiding tobacco and drug use. Limiting alcohol use. Practicing safe sex. Taking low doses of aspirin every day. Taking vitamin and mineral supplements as recommended by your health care provider. What happens during an annual well check? The services and screenings done by your health care provider during your annual well check will depend on your age, overall health, lifestyle risk factors, and family history of disease. Counseling  Your health care provider may ask you questions about your: Alcohol use. Tobacco use. Drug use. Emotional well-being. Home and relationship well-being. Sexual activity. Eating habits. History of falls. Memory and ability to understand (cognition). Work and work Astronomer. Screening  You may have the following tests or measurements: Height, weight, and BMI. Blood pressure. Lipid and cholesterol levels. These may be checked every 5 years, or more frequently if you are over 7 years old. Skin check. Lung cancer screening. You may have this screening every year starting at age 50 if you have a 30-pack-year history of smoking and currently smoke or have quit within the past 15 years. Fecal occult blood test (FOBT) of the stool. You may have this test every year starting at age 30. Flexible sigmoidoscopy or colonoscopy. You may have a sigmoidoscopy every 5 years or a colonoscopy every 10 years starting at age 9. Prostate cancer screening. Recommendations will vary depending on your family history and other risks. Hepatitis C blood test. Hepatitis  B blood test. Sexually transmitted disease (STD) testing. Diabetes screening. This is done by checking your blood sugar (glucose) after you have not eaten for a while (fasting). You may have this done every 1-3 years. Abdominal aortic aneurysm (AAA) screening. You may need this if you are a current or former smoker. Osteoporosis.  You may be screened starting at age 53 if you are at high risk. Talk with your health care provider about your test results, treatment options, and if necessary, the need for more tests. Vaccines  Your health care provider may recommend certain vaccines, such as: Influenza vaccine. This is recommended every year. Tetanus, diphtheria, and acellular pertussis (Tdap, Td) vaccine. You may need a Td booster every 10 years. Zoster vaccine. You may need this after age 46. Pneumococcal 13-valent conjugate (PCV13) vaccine. One dose is recommended after age 15. Pneumococcal polysaccharide (PPSV23) vaccine. One dose is recommended after age 35. Talk to your health care provider about which screenings and vaccines you need and how often you need them. This information is not intended to replace advice given to you by your health care provider. Make sure you discuss any questions you have with your health care provider. Document Released: 09/29/2015 Document Revised: 05/22/2016 Document Reviewed: 07/04/2015 Elsevier Interactive Patient Education  2017 [employer].  Fall Prevention in the Home Falls can cause injuries. They can happen to people of all ages. There are many things you can do to make your home safe and to help prevent falls. What can I do on the outside of my home? Regularly fix the edges of walkways and driveways and fix any cracks. Remove anything that might make you trip as you walk through a door, such as a raised step or threshold. Trim any bushes or trees on the path to your home. Use bright outdoor lighting. Clear any walking paths of anything that might make someone trip, such as rocks or tools. Regularly check to see if handrails are loose or broken. Make sure that both sides of any steps have handrails. Any raised decks and porches should have guardrails on the edges. Have any leaves, snow, or ice cleared regularly. Use sand or salt on walking paths during winter. Clean up any  spills in your garage right away. This includes oil or grease spills. What can I do in the bathroom? Use night lights. Install grab bars by the toilet and in the tub and shower. Do not use towel bars as grab bars. Use non-skid mats or decals in the tub or shower. If you need to sit down in the shower, use a plastic, non-slip stool. Keep the floor dry. Clean up any water that spills on the floor as soon as it happens. Remove soap buildup in the tub or shower regularly. Attach bath mats securely with double-sided non-slip rug tape. Do not have throw rugs and other things on the floor that can make you trip. What can I do in the bedroom? Use night lights. Make sure that you have a light by your bed that is easy to reach. Do not use any sheets or blankets that are too big for your bed. They should not hang down onto the floor. Have a firm chair that has side arms. You can use this for support while you get dressed. Do not have throw rugs and other things on the floor that can make you trip. What can I do in the kitchen? Clean up any spills right away. Avoid walking on wet floors. Keep  items that you use a lot in easy-to-reach places. If you need to reach something above you, use a strong step stool that has a grab bar. Keep electrical cords out of the way. Do not use floor polish or wax that makes floors slippery. If you must use wax, use non-skid floor wax. Do not have throw rugs and other things on the floor that can make you trip. What can I do with my stairs? Do not leave any items on the stairs. Make sure that there are handrails on both sides of the stairs and use them. Fix handrails that are broken or loose. Make sure that handrails are as long as the stairways. Check any carpeting to make sure that it is firmly attached to the stairs. Fix any carpet that is loose or worn. Avoid having throw rugs at the top or bottom of the stairs. If you do have throw rugs, attach them to the floor  with carpet tape. Make sure that you have a light switch at the top of the stairs and the bottom of the stairs. If you do not have them, ask someone to add them for you. What else can I do to help prevent falls? Wear shoes that: Do not have high heels. Have rubber bottoms. Are comfortable and fit you well. Are closed at the toe. Do not wear sandals. If you use a stepladder: Make sure that it is fully opened. Do not climb a closed stepladder. Make sure that both sides of the stepladder are locked into place. Ask someone to hold it for you, if possible. Clearly mark and make sure that you can see: Any grab bars or handrails. First and last steps. Where the edge of each step is. Use tools that help you move around (mobility aids) if they are needed. These include: Canes. Walkers. Scooters. Crutches. Turn on the lights when you go into a dark area. Replace any light bulbs as soon as they burn out. Set up your furniture so you have a clear path. Avoid moving your furniture around. If any of your floors are uneven, fix them. If there are any pets around you, be aware of where they are. Review your medicines with your doctor. Some medicines can make you feel dizzy. This can increase your chance of falling. Ask your doctor what other things that you can do to help prevent falls. This information is not intended to replace advice given to you by your health care provider. Make sure you discuss any questions you have with your health care provider. Document Released: 06/29/2009 Document Revised: 02/08/2016 Document Reviewed: 10/07/2014 Elsevier Interactive Patient Education  2017 [employer].

## 2023-04-28 NOTE — Progress Notes (Signed)
Subjective:   Jerry [name] is a 67 y.o. male who presents for Medicare Annual/Subsequent preventive examination.  Visit Complete: Virtual  I connected with  [name] on 04/28/23 by a audio enabled telemedicine application and verified that I am speaking with the correct person using two identifiers.  Patient Location: Home  Provider Location: Office/Clinic  I discussed the limitations of evaluation and management by telemedicine. The patient expressed understanding and agreed to proceed.  Patient Medicare AWV questionnaire was completed by the patient on 04/16/23; I have confirmed that all information answered by patient is correct and no changes since this date.  Vital Signs: Unable to obtain new vitals due to this being a telehealth visit.   Review of Systems      Cardiac Risk Factors include: advanced age (>38men, >27 women);hypertension;dyslipidemia;male gender;sedentary lifestyle;smoking/ tobacco exposure;diabetes mellitus     Objective:    Today's Vitals   04/22/23 1000  Weight: 186 lb (84.4 kg)  Height: 5\' 9"  (1.753 m)   Body mass index is 27.47 kg/m.     04/22/2023   10:08 AM 02/10/2023    9:42 AM 03/11/2022    7:01 AM 01/28/2022   11:37 AM 01/22/2022    9:41 AM 01/18/2022    1:41 PM 06/21/2021    5:06 PM  Advanced Directives  Does Patient Have a Medical Advance Directive? No No No  No No No  Would patient like information on creating a medical advance directive? No - Patient declined No - Patient declined  No - Patient declined  No - Patient declined Yes (ED - Information included in AVS)    Current Medications (verified) Outpatient Encounter Medications as of 04/22/2023  Medication Sig   amLODipine (NORVASC) 10 MG tablet Take 1 tablet (10 mg total) by mouth daily.   Ascorbic Acid (VITAMIN C) 100 MG tablet Take 100 mg by mouth daily.   ELDERBERRY PO Take 1 tablet by mouth daily.   ezetimibe (ZETIA) 10 MG tablet TAKE 1 TABLET BY MOUTH EVERY DAY    fexofenadine (ALLEGRA) 180 MG tablet Take 180 mg by mouth daily.   lisinopril (ZESTRIL) 10 MG tablet Take 1 tablet (10 mg total) by mouth daily.   metFORMIN (GLUCOPHAGE-XR) 500 MG 24 hr tablet Take 1 tablet (500 mg total) by mouth daily with breakfast.   Multiple Vitamin (MULTIVITAMIN) capsule Take 1 capsule by mouth daily.   Multiple Vitamins-Minerals (ZINC PO) Take 1 tablet by mouth daily.   OVER THE COUNTER MEDICATION Take 1 tablet by mouth daily. Nugenix   tadalafil (CIALIS) 5 MG tablet Take 1 tablet (5 mg total) by mouth daily.   tamsulosin (FLOMAX) 0.4 MG CAPS capsule TAKE 2 CAPSULES BY MOUTH EVERY DAY   Turmeric (QC TUMERIC COMPLEX PO) Take by mouth.   phenazopyridine (PYRIDIUM) 100 MG tablet Take 1 tablet (100 mg total) by mouth 3 (three) times daily as needed for pain. (Patient not taking: Reported on 04/22/2023)   No facility-administered encounter medications on file as of 04/22/2023.    Allergies (verified) Augmentin [amoxicillin-pot clavulanate], Crestor [rosuvastatin calcium], and Fluticasone propionate   History: Past Medical History:  Diagnosis Date   Allergic rhinitis, cause unspecified    Asthma    Diabetes mellitus without complication (HCC)    Elbow fracture age 74 or 36   left   History of kidney stones    Hx of colonic polyps    Other and unspecified hyperlipidemia    Sleep apnea    Unspecified essential  hypertension    Past Surgical History:  Procedure Laterality Date   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   surgery   CHOLECYSTECTOMY     COLONOSCOPY WITH PROPOFOL N/A 03/11/2022   Procedure: COLONOSCOPY WITH PROPOFOL;  Surgeon: Wyline Mood, MD;  Location: [facility] ENDOSCOPY;  Service: Gastroenterology;  Laterality: N/A;   FINGER SURGERY Right 02/2022   FRACTURE SURGERY     GANGLION CYST EXCISION Right 01/28/2022   Procedure: RIGHT SMALL FINGER MUCOUS CYST EXCISION;  Surgeon: Marlyne Beards, MD;  Location: [facility];  Service: Orthopedics;   Laterality: Right;   KIDNEY STONE SURGERY     left leg fracture     TONSILLECTOMY AND ADENOIDECTOMY     2000   Family History  Problem Relation Age of Onset   Lung cancer Father    Diabetes Mother    Pancreatic cancer Paternal Grandfather    Prostate cancer Other    Colon cancer Neg Hx    Social History   Socioeconomic History   Marital status: Married    Spouse name: Not on file   Number of children: 3   Years of education: Not on file   Highest education level: Not on file  Occupational History   Occupation: Pastor  Tobacco Use   Smoking status: Former    Current packs/day: 0.00    Average packs/day: 0.5 packs/day for 3.0 years (1.5 ttl pk-yrs)    Types: Cigarettes    Start date: 09/16/1969    Quit date: 09/16/1972    Years since quitting: 50.6   Smokeless tobacco: Never  Vaping Use   Vaping status: Never Used  Substance and Sexual Activity   Alcohol use: No    Alcohol/week: 0.0 standard drinks of alcohol   Drug use: No   Sexual activity: Not Currently  Other Topics Concern   Not on file  Social History Narrative   Not on file   Social Determinants of Health   Financial Resource Strain: Low Risk  (04/22/2023)   Overall Financial Resource Strain (CARDIA)    Difficulty of Paying Living Expenses: Not hard at all  Food Insecurity: No Food Insecurity (04/22/2023)   Hunger Vital Sign    Worried About Running Out of Food in the Last Year: Never true    Ran Out of Food in the Last Year: Never true  Transportation Needs: No Transportation Needs (04/22/2023)   PRAPARE - Administrator, Civil Service (Medical): No    Lack of Transportation (Non-Medical): No  Physical Activity: Insufficiently Active (04/22/2023)   Exercise Vital Sign    Days of Exercise per Week: 2 days    Minutes of Exercise per Session: 30 min  Stress: No Stress Concern Present (04/22/2023)   [employer] of Occupational Health - Occupational Stress Questionnaire    Feeling of Stress : Not at  all  Social Connections: Socially Integrated (04/22/2023)   Social Connection and Isolation Panel [NHANES]    Frequency of Communication with Friends and Family: More than three times a week    Frequency of Social Gatherings with Friends and Family: More than three times a week    Attends Religious Services: More than 4 times per year    Active Member of [employer] or Organizations: Yes    Attends Engineer, structural: More than 4 times per year    Marital Status: Married    Tobacco Counseling Counseling given: Not Answered   Clinical Intake:  Pre-visit preparation completed: Yes  Pain :  No/denies pain     BMI - recorded: 27.47 Nutritional Status: BMI 25 -29 Overweight Nutritional Risks: None Diabetes: Yes CBG done?: No (pt does not check) Did pt. bring in CBG monitor from home?: No  How often do you need to have someone help you when you read instructions, pamphlets, or other written materials from your doctor or pharmacy?: 1 - Never  Interpreter Needed?: No  Information entered by :: C.Beyza Bellino LPN   Activities of Daily Living    04/16/2023   10:04 AM  In your present state of health, do you have any difficulty performing the following activities:  Hearing? 0  Vision? 0  Difficulty concentrating or making decisions? 0  Walking or climbing stairs? 0  Dressing or bathing? 0  Doing errands, shopping? 0  Preparing Food and eating ? N  Using the Toilet? N  In the past six months, have you accidently leaked urine? N  Do you have problems with loss of bowel control? N  Managing your Medications? N  Managing your Finances? N  Housekeeping or managing your Housekeeping? N    Patient Care Team: Tower, Audrie Gallus, MD as PCP - General Mariah Milling, Tollie Pizza, MD as Consulting Physician (Cardiology)  Indicate any recent Medical Services you may have received from other than [facility] providers in the past year (date may be approximate).     Assessment:   This is a routine  wellness examination for Alison.  Hearing/Vision screen Hearing Screening - Comments:: Denies hearing difficulties   Vision Screening - Comments:: Glasses - [facility] - UTD on eye exams  Dietary issues and exercise activities discussed:     Goals Addressed             This Visit's Progress    Patient Stated       Lose weight       Depression Screen    04/22/2023   10:07 AM 10/28/2022    8:59 AM 08/02/2022   10:33 AM 01/22/2022    9:35 AM 12/18/2020    9:30 AM 11/12/2019    4:02 PM 07/27/2018    9:07 AM  PHQ 2/9 Scores  PHQ - 2 Score 0 0 0 0 0 0 0  PHQ- 9 Score  1    1     Fall Risk    04/16/2023   10:04 AM 10/28/2022    8:59 AM 08/02/2022   10:32 AM 01/22/2022    9:32 AM 12/18/2020    9:30 AM  Fall Risk   Falls in the past year? 0 0 0 0 0  Number falls in past yr: 0 0 0 0   Injury with Fall? 0 0 0 0   Risk for fall due to : No Fall Risks No Fall Risks No Fall Risks    Follow up Falls evaluation completed;Falls prevention discussed Falls evaluation completed Falls evaluation completed Falls evaluation completed;Education provided;Falls prevention discussed Falls evaluation completed    MEDICARE RISK AT HOME:   TIMED UP AND GO:  Was the test performed?  No    Cognitive Function:        04/22/2023   10:09 AM 01/22/2022    9:33 AM  6CIT Screen  What Year? 0 points 0 points  What month? 0 points 0 points  What time? 0 points 0 points  Count back from 20 0 points 0 points  Months in reverse 0 points 0 points  Repeat phrase 0 points 0 points  Total Score 0 points  0 points    Immunizations Immunization History  Administered Date(s) Administered   Influenza Split 08/05/2011, 10/23/2012, 09/04/2015   Influenza Whole 07/10/2005, 06/21/2008   Influenza,inj,Quad PF,6+ Mos 07/04/2017, 07/27/2018   Pneumococcal Polysaccharide-23 08/14/2005   Td 01/14/2002   Tdap 08/05/2011, 05/07/2019, 06/21/2021    TDAP status: Up to date  Flu Vaccine status: Declined,  Education has been provided regarding the importance of this vaccine but patient still declined. Advised may receive this vaccine at local pharmacy or Health Dept. Aware to provide a copy of the vaccination record if obtained from local pharmacy or Health Dept. Verbalized acceptance and understanding.  Pneumococcal vaccine status: Declined,  Education has been provided regarding the importance of this vaccine but patient still declined. Advised may receive this vaccine at local pharmacy or Health Dept. Aware to provide a copy of the vaccination record if obtained from local pharmacy or Health Dept. Verbalized acceptance and understanding.   Covid-19 vaccine status: Declined, Education has been provided regarding the importance of this vaccine but patient still declined. Advised may receive this vaccine at local pharmacy or Health Dept.or vaccine clinic. Aware to provide a copy of the vaccination record if obtained from local pharmacy or Health Dept. Verbalized acceptance and understanding.  Qualifies for Shingles Vaccine? Yes   Zostavax completed No   Shingrix Completed?: No.    Education has been provided regarding the importance of this vaccine. Patient has been advised to call insurance company to determine out of pocket expense if they have not yet received this vaccine. Advised may also receive vaccine at local pharmacy or Health Dept. Verbalized acceptance and understanding.  Screening Tests Health Maintenance  Topic Date Due   OPHTHALMOLOGY EXAM  12/20/2022   Diabetic kidney evaluation - Urine ACR  04/27/2023   HEMOGLOBIN A1C  04/21/2023   COVID-19 Vaccine (1 - 2023-24 season) 05/08/2023 (Originally 05/17/2022)   FOOT EXAM  05/23/2023 (Originally 06/18/2022)   Zoster Vaccines- Shingrix (1 of 2) 07/23/2023 (Originally 07/28/2005)   Pneumonia Vaccine 91+ Years old (2 of 2 - PCV) 10/29/2023 (Originally 07/28/2020)   Hepatitis C Screening  11/08/2023 (Originally 07/28/1974)   INFLUENZA VACCINE   12/15/2023 (Originally 04/17/2023)   Diabetic kidney evaluation - eGFR measurement  02/10/2024   Medicare Annual Wellness (AWV)  04/21/2024   Colonoscopy  03/11/2029   DTaP/Tdap/Td (5 - Td or Tdap) 06/22/2031   HPV VACCINES  Aged Out    Health Maintenance  Health Maintenance Due  Topic Date Due   OPHTHALMOLOGY EXAM  12/20/2022   Diabetic kidney evaluation - Urine ACR  04/27/2023   HEMOGLOBIN A1C  04/21/2023    Colorectal cancer screening: Type of screening: Colonoscopy. Completed 03/11/22. Repeat every 7 years  Lung Cancer Screening: (Low Dose CT Chest recommended if Age 28-80 years, 20 pack-year currently smoking OR have quit w/in 15years.) does not qualify.   Lung Cancer Screening Referral: no  Additional Screening:  Hepatitis C Screening: does qualify; Completed DUE  Vision Screening: Recommended annual ophthalmology exams for early detection of glaucoma and other disorders of the eye. Is the patient up to date with their annual eye exam?  Yes  Who is the provider or what is the name of the office in which the patient attends annual eye exams? [facility] If pt is not established with a provider, would they like to be referred to a provider to establish care? Yes .   Dental Screening: Recommended annual dental exams for proper oral hygiene  Diabetic Foot Exam: Diabetic Foot  Exam: Completed 06/19/23 pt stated that his PCP performs his foot exams.  Community Resource Referral / Chronic Care Management: CRR required this visit?  No   CCM required this visit?  No     Plan:     I have personally reviewed and noted the following in the patient's chart:   Medical and social history Use of alcohol, tobacco or illicit drugs  Current medications and supplements including opioid prescriptions. Patient is not currently taking opioid prescriptions. Functional ability and status Nutritional status Physical activity Advanced directives List of other  physicians Hospitalizations, surgeries, and ER visits in previous 12 months Vitals Screenings to include cognitive, depression, and falls Referrals and appointments  In addition, I have reviewed and discussed with patient certain preventive protocols, quality metrics, and best practice recommendations. A written personalized care plan for preventive services as well as general preventive health recommendations were provided to patient.     Maryan Puls, LPN   [id number]/[id number]   After Visit Summary: (MyChart) Due to this being a telephonic visit, the after visit summary with patients personalized plan was offered to patient via MyChart   Nurse Notes: Vaccinations: declines all Influenza vaccine: recommend every Fall Pneumococcal vaccine: recommend once per lifetime Prevnar-20 Shingles vaccine: recommend Shingrix which is 2 doses 2-6 months apart and over 90% effective     Covid-19: recommend 2 doses one month apart with a booster 6 months later   Order placed for diabetic eye exam.

## 2023-04-28 NOTE — Progress Notes (Signed)
Subjective:   Jerry [name] is a 67 y.o. male who presents for Medicare Annual/Subsequent preventive examination.  Visit Complete: Virtual  I connected with  [name] on 04/28/23 by a audio enabled telemedicine application and verified that I am speaking with the correct person using two identifiers.  Patient Location: Home  Provider Location: Office/Clinic  I discussed the limitations of evaluation and management by telemedicine. The patient expressed understanding and agreed to proceed.  Patient Medicare AWV questionnaire was completed by the patient on 04/16/23; I have confirmed that all information answered by patient is correct and no changes since this date.  Vital Signs: Unable to obtain new vitals due to this being a telehealth visit.   Review of Systems      Cardiac Risk Factors include: advanced age (>38men, >27 women);hypertension;dyslipidemia;male gender;sedentary lifestyle;smoking/ tobacco exposure;diabetes mellitus     Objective:    Today's Vitals   04/22/23 1000  Weight: 186 lb (84.4 kg)  Height: 5\' 9"  (1.753 m)   Body mass index is 27.47 kg/m.     04/22/2023   10:08 AM 02/10/2023    9:42 AM 03/11/2022    7:01 AM 01/28/2022   11:37 AM 01/22/2022    9:41 AM 01/18/2022    1:41 PM 06/21/2021    5:06 PM  Advanced Directives  Does Patient Have a Medical Advance Directive? No No No  No No No  Would patient like information on creating a medical advance directive? No - Patient declined No - Patient declined  No - Patient declined  No - Patient declined Yes (ED - Information included in AVS)    Current Medications (verified) Outpatient Encounter Medications as of 04/22/2023  Medication Sig   amLODipine (NORVASC) 10 MG tablet Take 1 tablet (10 mg total) by mouth daily.   Ascorbic Acid (VITAMIN C) 100 MG tablet Take 100 mg by mouth daily.   ELDERBERRY PO Take 1 tablet by mouth daily.   ezetimibe (ZETIA) 10 MG tablet TAKE 1 TABLET BY MOUTH EVERY DAY    fexofenadine (ALLEGRA) 180 MG tablet Take 180 mg by mouth daily.   lisinopril (ZESTRIL) 10 MG tablet Take 1 tablet (10 mg total) by mouth daily.   metFORMIN (GLUCOPHAGE-XR) 500 MG 24 hr tablet Take 1 tablet (500 mg total) by mouth daily with breakfast.   Multiple Vitamin (MULTIVITAMIN) capsule Take 1 capsule by mouth daily.   Multiple Vitamins-Minerals (ZINC PO) Take 1 tablet by mouth daily.   OVER THE COUNTER MEDICATION Take 1 tablet by mouth daily. Nugenix   tadalafil (CIALIS) 5 MG tablet Take 1 tablet (5 mg total) by mouth daily.   tamsulosin (FLOMAX) 0.4 MG CAPS capsule TAKE 2 CAPSULES BY MOUTH EVERY DAY   Turmeric (QC TUMERIC COMPLEX PO) Take by mouth.   phenazopyridine (PYRIDIUM) 100 MG tablet Take 1 tablet (100 mg total) by mouth 3 (three) times daily as needed for pain. (Patient not taking: Reported on 04/22/2023)   No facility-administered encounter medications on file as of 04/22/2023.    Allergies (verified) Augmentin [amoxicillin-pot clavulanate], Crestor [rosuvastatin calcium], and Fluticasone propionate   History: Past Medical History:  Diagnosis Date   Allergic rhinitis, cause unspecified    Asthma    Diabetes mellitus without complication (HCC)    Elbow fracture age 74 or 36   left   History of kidney stones    Hx of colonic polyps    Other and unspecified hyperlipidemia    Sleep apnea    Unspecified essential  hypertension    Past Surgical History:  Procedure Laterality Date   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   surgery   CHOLECYSTECTOMY     COLONOSCOPY WITH PROPOFOL N/A 03/11/2022   Procedure: COLONOSCOPY WITH PROPOFOL;  Surgeon: Wyline Mood, MD;  Location: [facility] ENDOSCOPY;  Service: Gastroenterology;  Laterality: N/A;   FINGER SURGERY Right 02/2022   FRACTURE SURGERY     GANGLION CYST EXCISION Right 01/28/2022   Procedure: RIGHT SMALL FINGER MUCOUS CYST EXCISION;  Surgeon: Marlyne Beards, MD;  Location: [facility];  Service: Orthopedics;   Laterality: Right;   KIDNEY STONE SURGERY     left leg fracture     TONSILLECTOMY AND ADENOIDECTOMY     2000   Family History  Problem Relation Age of Onset   Lung cancer Father    Diabetes Mother    Pancreatic cancer Paternal Grandfather    Prostate cancer Other    Colon cancer Neg Hx    Social History   Socioeconomic History   Marital status: Married    Spouse name: Not on file   Number of children: 3   Years of education: Not on file   Highest education level: Not on file  Occupational History   Occupation: Pastor  Tobacco Use   Smoking status: Former    Current packs/day: 0.00    Average packs/day: 0.5 packs/day for 3.0 years (1.5 ttl pk-yrs)    Types: Cigarettes    Start date: 09/16/1969    Quit date: 09/16/1972    Years since quitting: 50.6   Smokeless tobacco: Never  Vaping Use   Vaping status: Never Used  Substance and Sexual Activity   Alcohol use: No    Alcohol/week: 0.0 standard drinks of alcohol   Drug use: No   Sexual activity: Not Currently  Other Topics Concern   Not on file  Social History Narrative   Not on file   Social Determinants of Health   Financial Resource Strain: Low Risk  (04/22/2023)   Overall Financial Resource Strain (CARDIA)    Difficulty of Paying Living Expenses: Not hard at all  Food Insecurity: No Food Insecurity (04/22/2023)   Hunger Vital Sign    Worried About Running Out of Food in the Last Year: Never true    Ran Out of Food in the Last Year: Never true  Transportation Needs: No Transportation Needs (04/22/2023)   PRAPARE - Administrator, Civil Service (Medical): No    Lack of Transportation (Non-Medical): No  Physical Activity: Insufficiently Active (04/22/2023)   Exercise Vital Sign    Days of Exercise per Week: 2 days    Minutes of Exercise per Session: 30 min  Stress: No Stress Concern Present (04/22/2023)   [employer] of Occupational Health - Occupational Stress Questionnaire    Feeling of Stress : Not at  all  Social Connections: Socially Integrated (04/22/2023)   Social Connection and Isolation Panel [NHANES]    Frequency of Communication with Friends and Family: More than three times a week    Frequency of Social Gatherings with Friends and Family: More than three times a week    Attends Religious Services: More than 4 times per year    Active Member of [employer] or Organizations: Yes    Attends Engineer, structural: More than 4 times per year    Marital Status: Married    Tobacco Counseling Counseling given: Not Answered   Clinical Intake:  Pre-visit preparation completed: Yes  Pain :  No/denies pain     BMI - recorded: 27.47 Nutritional Status: BMI 25 -29 Overweight Nutritional Risks: None Diabetes: Yes CBG done?: No (pt does not check) Did pt. bring in CBG monitor from home?: No  How often do you need to have someone help you when you read instructions, pamphlets, or other written materials from your doctor or pharmacy?: 1 - Never  Interpreter Needed?: No  Information entered by :: C.Desire Fulp LPN   Activities of Daily Living    04/16/2023   10:04 AM  In your present state of health, do you have any difficulty performing the following activities:  Hearing? 0  Vision? 0  Difficulty concentrating or making decisions? 0  Walking or climbing stairs? 0  Dressing or bathing? 0  Doing errands, shopping? 0  Preparing Food and eating ? N  Using the Toilet? N  In the past six months, have you accidently leaked urine? N  Do you have problems with loss of bowel control? N  Managing your Medications? N  Managing your Finances? N  Housekeeping or managing your Housekeeping? N    Patient Care Team: Tower, Audrie Gallus, MD as PCP - General Mariah Milling, Tollie Pizza, MD as Consulting Physician (Cardiology)  Indicate any recent Medical Services you may have received from other than [facility] providers in the past year (date may be approximate).     Assessment:   This is a routine  wellness examination for Alison.  Hearing/Vision screen Hearing Screening - Comments:: Denies hearing difficulties   Vision Screening - Comments:: Glasses - [facility] - UTD on eye exams  Dietary issues and exercise activities discussed:     Goals Addressed             This Visit's Progress    Patient Stated       Lose weight       Depression Screen    04/22/2023   10:07 AM 10/28/2022    8:59 AM 08/02/2022   10:33 AM 01/22/2022    9:35 AM 12/18/2020    9:30 AM 11/12/2019    4:02 PM 07/27/2018    9:07 AM  PHQ 2/9 Scores  PHQ - 2 Score 0 0 0 0 0 0 0  PHQ- 9 Score  1    1     Fall Risk    04/16/2023   10:04 AM 10/28/2022    8:59 AM 08/02/2022   10:32 AM 01/22/2022    9:32 AM 12/18/2020    9:30 AM  Fall Risk   Falls in the past year? 0 0 0 0 0  Number falls in past yr: 0 0 0 0   Injury with Fall? 0 0 0 0   Risk for fall due to : No Fall Risks No Fall Risks No Fall Risks    Follow up Falls evaluation completed;Falls prevention discussed Falls evaluation completed Falls evaluation completed Falls evaluation completed;Education provided;Falls prevention discussed Falls evaluation completed    MEDICARE RISK AT HOME:   TIMED UP AND GO:  Was the test performed?  No    Cognitive Function:        04/22/2023   10:09 AM 01/22/2022    9:33 AM  6CIT Screen  What Year? 0 points 0 points  What month? 0 points 0 points  What time? 0 points 0 points  Count back from 20 0 points 0 points  Months in reverse 0 points 0 points  Repeat phrase 0 points 0 points  Total Score 0 points  0 points    Immunizations Immunization History  Administered Date(s) Administered   Influenza Split 08/05/2011, 10/23/2012, 09/04/2015   Influenza Whole 07/10/2005, 06/21/2008   Influenza,inj,Quad PF,6+ Mos 07/04/2017, 07/27/2018   Pneumococcal Polysaccharide-23 08/14/2005   Td 01/14/2002   Tdap 08/05/2011, 05/07/2019, 06/21/2021    TDAP status: Up to date  Flu Vaccine status: Declined,  Education has been provided regarding the importance of this vaccine but patient still declined. Advised may receive this vaccine at local pharmacy or Health Dept. Aware to provide a copy of the vaccination record if obtained from local pharmacy or Health Dept. Verbalized acceptance and understanding.  Pneumococcal vaccine status: Declined,  Education has been provided regarding the importance of this vaccine but patient still declined. Advised may receive this vaccine at local pharmacy or Health Dept. Aware to provide a copy of the vaccination record if obtained from local pharmacy or Health Dept. Verbalized acceptance and understanding.   Covid-19 vaccine status: Declined, Education has been provided regarding the importance of this vaccine but patient still declined. Advised may receive this vaccine at local pharmacy or Health Dept.or vaccine clinic. Aware to provide a copy of the vaccination record if obtained from local pharmacy or Health Dept. Verbalized acceptance and understanding.  Qualifies for Shingles Vaccine? Yes   Zostavax completed No   Shingrix Completed?: No.    Education has been provided regarding the importance of this vaccine. Patient has been advised to call insurance company to determine out of pocket expense if they have not yet received this vaccine. Advised may also receive vaccine at local pharmacy or Health Dept. Verbalized acceptance and understanding.  Screening Tests Health Maintenance  Topic Date Due   OPHTHALMOLOGY EXAM  12/20/2022   Diabetic kidney evaluation - Urine ACR  04/27/2023   HEMOGLOBIN A1C  04/21/2023   COVID-19 Vaccine (1 - 2023-24 season) 05/08/2023 (Originally 05/17/2022)   FOOT EXAM  05/23/2023 (Originally 06/18/2022)   Zoster Vaccines- Shingrix (1 of 2) 07/23/2023 (Originally 07/28/2005)   Pneumonia Vaccine 91+ Years old (2 of 2 - PCV) 10/29/2023 (Originally 07/28/2020)   Hepatitis C Screening  11/08/2023 (Originally 07[birth year]28/1973)   INFLUENZA VACCINE   12/15/2023 (Originally 04/17/2023)   Diabetic kidney evaluation - eGFR measurement  02/10/2024   Medicare Annual Wellness (AWV)  04/21/2024   Colonoscopy  03/11/2029   DTaP/Tdap/Td (5 - Td or Tdap) 06/22/2031   HPV VACCINES  Aged Out    Health Maintenance  Health Maintenance Due  Topic Date Due   OPHTHALMOLOGY EXAM  12/20/2022   Diabetic kidney evaluation - Urine ACR  04/27/2023   HEMOGLOBIN A1C  04/21/2023    Colorectal cancer screening: Type of screening: Colonoscopy. Completed 03/11/22. Repeat every 7 years  Lung Cancer Screening: (Low Dose CT Chest recommended if Age 28-80 years, 20 pack-year currently smoking OR have quit w/in 15years.) does not qualify.   Lung Cancer Screening Referral: no  Additional Screening:  Hepatitis C Screening: does qualify; Completed DUE  Vision Screening: Recommended annual ophthalmology exams for early detection of glaucoma and other disorders of the eye. Is the patient up to date with their annual eye exam?  Yes  Who is the provider or what is the name of the office in which the patient attends annual eye exams? [facility] If pt is not established with a provider, would they like to be referred to a provider to establish care? Yes .   Dental Screening: Recommended annual dental exams for proper oral hygiene  Diabetic Foot Exam: Diabetic Foot  Exam: Completed 06/19/23 pt stated that his PCP performs his foot exams.  Community Resource Referral / Chronic Care Management: CRR required this visit?  No   CCM required this visit?  No     Plan:     I have personally reviewed and noted the following in the patient's chart:   Medical and social history Use of alcohol, tobacco or illicit drugs  Current medications and supplements including opioid prescriptions. Patient is not currently taking opioid prescriptions. Functional ability and status Nutritional status Physical activity Advanced directives List of other  physicians Hospitalizations, surgeries, and ER visits in previous 12 months Vitals Screenings to include cognitive, depression, and falls Referrals and appointments  In addition, I have reviewed and discussed with patient certain preventive protocols, quality metrics, and best practice recommendations. A written personalized care plan for preventive services as well as general preventive health recommendations were provided to patient.     Maryan Puls, LPN   1/[id number]/[id number]   After Visit Summary: (MyChart) Due to this being a telephonic visit, the after visit summary with patients personalized plan was offered to patient via MyChart   Nurse Notes: Vaccinations: declines all Influenza vaccine: recommend every Fall Pneumococcal vaccine: recommend once per lifetime Prevnar-20 Shingles vaccine: recommend Shingrix which is 2 doses 2-6 months apart and over 90% effective     Covid-19: recommend 2 doses one month apart with a booster 6 months later   Order placed for diabetic eye exam.

## 2023-04-30 DIAGNOSIS — G4733 Obstructive sleep apnea (adult) (pediatric): Secondary | ICD-10-CM | POA: Diagnosis not present

## 2023-05-27 DIAGNOSIS — Z823 Family history of stroke: Secondary | ICD-10-CM | POA: Diagnosis not present

## 2023-05-27 DIAGNOSIS — N529 Male erectile dysfunction, unspecified: Secondary | ICD-10-CM | POA: Diagnosis not present

## 2023-05-27 DIAGNOSIS — E1136 Type 2 diabetes mellitus with diabetic cataract: Secondary | ICD-10-CM | POA: Diagnosis not present

## 2023-05-27 DIAGNOSIS — Z87891 Personal history of nicotine dependence: Secondary | ICD-10-CM | POA: Diagnosis not present

## 2023-05-27 DIAGNOSIS — E785 Hyperlipidemia, unspecified: Secondary | ICD-10-CM | POA: Diagnosis not present

## 2023-05-27 DIAGNOSIS — G4733 Obstructive sleep apnea (adult) (pediatric): Secondary | ICD-10-CM | POA: Diagnosis not present

## 2023-05-27 DIAGNOSIS — Z7984 Long term (current) use of oral hypoglycemic drugs: Secondary | ICD-10-CM | POA: Diagnosis not present

## 2023-05-27 DIAGNOSIS — Z008 Encounter for other general examination: Secondary | ICD-10-CM | POA: Diagnosis not present

## 2023-05-27 DIAGNOSIS — Z8249 Family history of ischemic heart disease and other diseases of the circulatory system: Secondary | ICD-10-CM | POA: Diagnosis not present

## 2023-05-27 DIAGNOSIS — J301 Allergic rhinitis due to pollen: Secondary | ICD-10-CM | POA: Diagnosis not present

## 2023-05-27 DIAGNOSIS — K219 Gastro-esophageal reflux disease without esophagitis: Secondary | ICD-10-CM | POA: Diagnosis not present

## 2023-05-27 DIAGNOSIS — Z809 Family history of malignant neoplasm, unspecified: Secondary | ICD-10-CM | POA: Diagnosis not present

## 2023-05-27 DIAGNOSIS — I10 Essential (primary) hypertension: Secondary | ICD-10-CM | POA: Diagnosis not present

## 2023-05-31 DIAGNOSIS — G4733 Obstructive sleep apnea (adult) (pediatric): Secondary | ICD-10-CM | POA: Diagnosis not present

## 2023-06-02 DIAGNOSIS — E119 Type 2 diabetes mellitus without complications: Secondary | ICD-10-CM | POA: Diagnosis not present

## 2023-06-02 DIAGNOSIS — H2513 Age-related nuclear cataract, bilateral: Secondary | ICD-10-CM | POA: Diagnosis not present

## 2023-06-02 LAB — HM DIABETES EYE EXAM

## 2023-06-30 DIAGNOSIS — G4733 Obstructive sleep apnea (adult) (pediatric): Secondary | ICD-10-CM | POA: Diagnosis not present

## 2023-07-25 ENCOUNTER — Ambulatory Visit: Payer: Medicare HMO | Admitting: Physician Assistant

## 2023-07-25 DIAGNOSIS — R102 Pelvic and perineal pain: Secondary | ICD-10-CM

## 2023-07-25 DIAGNOSIS — N138 Other obstructive and reflux uropathy: Secondary | ICD-10-CM | POA: Diagnosis not present

## 2023-07-25 DIAGNOSIS — N419 Inflammatory disease of prostate, unspecified: Secondary | ICD-10-CM | POA: Diagnosis not present

## 2023-07-25 DIAGNOSIS — N401 Enlarged prostate with lower urinary tract symptoms: Secondary | ICD-10-CM | POA: Diagnosis not present

## 2023-07-25 DIAGNOSIS — R350 Frequency of micturition: Secondary | ICD-10-CM | POA: Diagnosis not present

## 2023-07-25 DIAGNOSIS — N411 Chronic prostatitis: Secondary | ICD-10-CM

## 2023-07-25 DIAGNOSIS — R972 Elevated prostate specific antigen [PSA]: Secondary | ICD-10-CM | POA: Diagnosis not present

## 2023-07-25 LAB — URINALYSIS, COMPLETE
Bilirubin, UA: NEGATIVE
Glucose, UA: NEGATIVE
Leukocytes,UA: NEGATIVE
Nitrite, UA: NEGATIVE
RBC, UA: NEGATIVE
Specific Gravity, UA: >1.03 — ABNORMAL HIGH (ref 1.005–1.030)
Urobilinogen, Ur: 0.2 mg/dL (ref 0.2–1.0)
pH, UA: 5.5 (ref 5.0–7.5)

## 2023-07-25 LAB — MICROSCOPIC EXAMINATION

## 2023-07-25 LAB — BLADDER SCAN AMB NON-IMAGING: PVR: 22 WU

## 2023-07-25 NOTE — Progress Notes (Signed)
07/25/2023 4:33 PM   Jerry [name] August 12, 1956 [id number]  CC: Chief Complaint  Patient presents with   Prostatitis   HPI: Jerry [name] is a 67 y.o. male with PMH nephrolithiasis, BPH, and fluctuating PSA in the setting of irritative voiding symptoms who completed 4 weeks of Keflex for chronic prostatitis in June who presents today for symptom recurrence.   Today he reports his irritative voiding symptoms completely resolved with a month of antibiotics, however they recurred about 4 weeks later.  They have been progressively worsening since.  Today he is primarily bothered by postvoid dribbling, weak stream, dysuria, urgency, and perineal tenderness with sitting.  He remains on Flomax 0.8 mg daily.  He denies fevers.  Most recent urine culture dated 02/10/2023 grew low colony counts of E faecalis.  In-office UA today positive for trace ketones and trace protein; urine microscopy with moderate bacteria.  PVR 22 mL.  PMH: Past Medical History:  Diagnosis Date   Allergic rhinitis, cause unspecified    Asthma    Diabetes mellitus without complication (HCC)    Elbow fracture age 9 or 38   left   History of kidney stones    Hx of colonic polyps    Other and unspecified hyperlipidemia    Sleep apnea    Unspecified essential hypertension     Surgical History: Past Surgical History:  Procedure Laterality Date   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   surgery   CHOLECYSTECTOMY     COLONOSCOPY WITH PROPOFOL N/A 03/11/2022   Procedure: COLONOSCOPY WITH PROPOFOL;  Surgeon: Wyline Mood, MD;  Location: [facility]COPY;  Service: Gastroenterology;  Laterality: N/A;   FINGER SURGERY Right 02/2022   FRACTURE SURGERY     GANGLION CYST EXCISION Right 01/28/2022   Procedure: RIGHT SMALL FINGER MUCOUS CYST EXCISION;  Surgeon: Marlyne Beards, MD;  Location: [facility];  Service: Orthopedics;  Laterality: Right;   KIDNEY STONE SURGERY     left leg fracture      TONSILLECTOMY AND ADENOIDECTOMY     2000    Home Medications:  Allergies as of 07/25/2023       Reactions   Augmentin [amoxicillin-pot Clavulanate] Nausea And Vomiting   Crestor [rosuvastatin Calcium]    Body pain    Fluticasone Propionate    REACTION: nosebleeds        Medication List        Accurate as of July 25, 2023  4:33 PM. If you have any questions, ask your nurse or doctor.          amLODipine 10 MG tablet Commonly known as: NORVASC Take 1 tablet (10 mg total) by mouth daily.   ELDERBERRY PO Take 1 tablet by mouth daily.   ezetimibe 10 MG tablet Commonly known as: ZETIA TAKE 1 TABLET BY MOUTH EVERY DAY   fexofenadine 180 MG tablet Commonly known as: ALLEGRA Take 180 mg by mouth daily.   lisinopril 10 MG tablet Commonly known as: ZESTRIL Take 1 tablet (10 mg total) by mouth daily.   metFORMIN 500 MG 24 hr tablet Commonly known as: GLUCOPHAGE-XR Take 1 tablet (500 mg total) by mouth daily with breakfast.   multivitamin capsule Take 1 capsule by mouth daily.   OVER THE COUNTER MEDICATION Take 1 tablet by mouth daily. Nugenix   phenazopyridine 100 MG tablet Commonly known as: Pyridium Take 1 tablet (100 mg total) by mouth 3 (three) times daily as needed for pain.   QC TUMERIC COMPLEX PO Take  by mouth.   tadalafil 5 MG tablet Commonly known as: CIALIS Take 1 tablet (5 mg total) by mouth daily.   tamsulosin 0.4 MG Caps capsule Commonly known as: FLOMAX TAKE 2 CAPSULES BY MOUTH EVERY DAY   vitamin C 100 MG tablet Take 100 mg by mouth daily.   ZINC PO Take 1 tablet by mouth daily.        Allergies:  Allergies  Allergen Reactions   Augmentin [Amoxicillin-Pot Clavulanate] Nausea And Vomiting   Crestor [Rosuvastatin Calcium]     Body pain    Fluticasone Propionate     REACTION: nosebleeds    Family History: Family History  Problem Relation Age of Onset   Lung cancer Father    Diabetes Mother    Pancreatic cancer Paternal  Grandfather    Prostate cancer Other    Colon cancer Neg Hx     Social History:   reports that he quit smoking about 50 years ago. His smoking use included cigarettes. He started smoking about 53 years ago. He has a 1.5 pack-year smoking history. He has never used smokeless tobacco. He reports that he does not drink alcohol and does not use drugs.  Physical Exam: There were no vitals taken for this visit.  Constitutional:  Alert and oriented, no acute distress, nontoxic appearing HEENT: [place], AT Cardiovascular: No clubbing, cyanosis, or edema Respiratory: Normal respiratory effort, no increased work of breathing Skin: No rashes, bruises or suspicious lesions Neurologic: Grossly intact, no focal deficits, moving all 4 extremities Psychiatric: Normal mood and affect  Laboratory Data: Results for orders placed or performed in visit on 07/25/23  Microscopic Examination   Urine  Result Value Ref Range   WBC, UA 0-5 0 - 5 /hpf   RBC, Urine 0-2 0 - 2 /hpf   Epithelial Cells (non renal) 0-10 0 - 10 /hpf   Casts Present (A) None seen /lpf   Cast Type Hyaline casts N/A   Mucus, UA Present (A) Not Estab.   Bacteria, UA Moderate (A) None seen/Few  Urinalysis, Complete  Result Value Ref Range   Specific Gravity, UA >1.030 (H) 1.005 - 1.030   pH, UA 5.5 5.0 - 7.5   Color, UA Yellow Yellow   Appearance Ur Clear Clear   Leukocytes,UA Negative Negative   Protein,UA Trace (A) Negative/Trace   Glucose, UA Negative Negative   Ketones, UA Trace (A) Negative   RBC, UA Negative Negative   Bilirubin, UA Negative Negative   Urobilinogen, Ur 0.2 0.2 - 1.0 mg/dL   Nitrite, UA Negative Negative   Microscopic Examination See below:   Bladder Scan (Post Void Residual) in office  Result Value Ref Range   PVR 22.0 WU   Assessment & Plan:   1. Chronic prostatitis Recurrent irritative/obstructive voiding symptoms and perineal tenderness with sitting consistent with chronic prostatitis flare.  I  recommend 6 weeks of tissue penetrating antibiotic therapy, but would like to defer initiating medication pending his urine culture results.  He is in agreement with this plan.  If his symptoms do not resolve, we discussed pursuing cystoscopy. - Urinalysis, Complete - Bladder Scan (Post Void Residual) in office - CULTURE, URINE COMPREHENSIVE  Return for Will call to start antibiotics per culture results.  Carman Ching, PA-C  [facility] [place] [address], [address] [place], [place] [address] [phone]

## 2023-07-28 LAB — CULTURE, URINE COMPREHENSIVE

## 2023-07-29 DIAGNOSIS — N411 Chronic prostatitis: Secondary | ICD-10-CM

## 2023-07-30 MED ORDER — AMOXICILLIN 500 MG PO TABS
1000.0000 mg | ORAL_TABLET | Freq: Three times a day (TID) | ORAL | 0 refills | Status: AC
Start: 2023-07-30 — End: 2023-09-10

## 2023-10-10 ENCOUNTER — Other Ambulatory Visit: Payer: Self-pay | Admitting: Family Medicine

## 2023-10-10 NOTE — Telephone Encounter (Signed)
Pt is due for his CPE (labs prior) on or after 10/30/23, please schedule and then route back to me, thanks

## 2023-10-10 NOTE — Telephone Encounter (Signed)
Called pt and schedule appt for cpe

## 2023-10-11 ENCOUNTER — Other Ambulatory Visit: Payer: Self-pay | Admitting: Family Medicine

## 2023-10-11 ENCOUNTER — Other Ambulatory Visit: Payer: Self-pay | Admitting: Physician Assistant

## 2023-10-31 ENCOUNTER — Encounter: Payer: Medicare HMO | Admitting: Family Medicine

## 2023-11-12 ENCOUNTER — Encounter: Payer: Medicare HMO | Admitting: Family Medicine

## 2023-12-08 ENCOUNTER — Ambulatory Visit (INDEPENDENT_AMBULATORY_CARE_PROVIDER_SITE_OTHER): Payer: Medicare HMO | Admitting: Family Medicine

## 2023-12-08 ENCOUNTER — Encounter: Payer: Self-pay | Admitting: Family Medicine

## 2023-12-08 VITALS — BP 132/76 | HR 56 | Temp 98.0°F | Ht 68.0 in | Wt 183.5 lb

## 2023-12-08 DIAGNOSIS — I10 Essential (primary) hypertension: Secondary | ICD-10-CM

## 2023-12-08 DIAGNOSIS — Z0000 Encounter for general adult medical examination without abnormal findings: Secondary | ICD-10-CM | POA: Diagnosis not present

## 2023-12-08 DIAGNOSIS — E785 Hyperlipidemia, unspecified: Secondary | ICD-10-CM

## 2023-12-08 DIAGNOSIS — T466X5A Adverse effect of antihyperlipidemic and antiarteriosclerotic drugs, initial encounter: Secondary | ICD-10-CM

## 2023-12-08 DIAGNOSIS — E1169 Type 2 diabetes mellitus with other specified complication: Secondary | ICD-10-CM

## 2023-12-08 DIAGNOSIS — G4733 Obstructive sleep apnea (adult) (pediatric): Secondary | ICD-10-CM

## 2023-12-08 DIAGNOSIS — N411 Chronic prostatitis: Secondary | ICD-10-CM

## 2023-12-08 DIAGNOSIS — R7989 Other specified abnormal findings of blood chemistry: Secondary | ICD-10-CM | POA: Diagnosis not present

## 2023-12-08 DIAGNOSIS — I700 Atherosclerosis of aorta: Secondary | ICD-10-CM | POA: Diagnosis not present

## 2023-12-08 DIAGNOSIS — Z860100 Personal history of colon polyps, unspecified: Secondary | ICD-10-CM

## 2023-12-08 DIAGNOSIS — Z125 Encounter for screening for malignant neoplasm of prostate: Secondary | ICD-10-CM

## 2023-12-08 DIAGNOSIS — Z8042 Family history of malignant neoplasm of prostate: Secondary | ICD-10-CM

## 2023-12-08 DIAGNOSIS — G720 Drug-induced myopathy: Secondary | ICD-10-CM

## 2023-12-08 DIAGNOSIS — E119 Type 2 diabetes mellitus without complications: Secondary | ICD-10-CM

## 2023-12-08 LAB — LIPID PANEL
Cholesterol: 144 mg/dL (ref 0–200)
HDL: 44.7 mg/dL (ref >39.00)
LDL Cholesterol: 81 mg/dL (ref 0–99)
NonHDL: 98.85
Total CHOL/HDL Ratio: 3
Triglycerides: 90 mg/dL (ref 0.0–149.0)
VLDL: 18 mg/dL (ref 0.0–40.0)

## 2023-12-08 LAB — COMPREHENSIVE METABOLIC PANEL
ALT: 18 U/L (ref 0–53)
AST: 15 U/L (ref 0–37)
Albumin: 4.5 g/dL (ref 3.5–5.2)
Alkaline Phosphatase: 35 U/L — ABNORMAL LOW (ref 39–117)
BUN: 17 mg/dL (ref 6–23)
CO2: 27 meq/L (ref 19–32)
Calcium: 9.1 mg/dL (ref 8.4–10.5)
Chloride: 105 meq/L (ref 96–112)
Creatinine, Ser: 1.03 mg/dL (ref 0.40–1.50)
GFR: 74.72 mL/min (ref >60.00)
Glucose, Bld: 138 mg/dL — ABNORMAL HIGH (ref 70–99)
Potassium: 3.8 meq/L (ref 3.5–5.1)
Sodium: 141 meq/L (ref 135–145)
Total Bilirubin: 0.8 mg/dL (ref 0.2–1.2)
Total Protein: 7.2 g/dL (ref 6.0–8.3)

## 2023-12-08 LAB — CBC WITH DIFFERENTIAL/PLATELET
Basophils Absolute: 0.1 10*3/uL (ref 0.0–0.1)
Basophils Relative: 0.9 % (ref 0.0–3.0)
Eosinophils Absolute: 0.5 10*3/uL (ref 0.0–0.7)
Eosinophils Relative: 8.1 % — ABNORMAL HIGH (ref 0.0–5.0)
HCT: 41 % (ref 39.0–52.0)
Hemoglobin: 13.9 g/dL (ref 13.0–17.0)
Lymphocytes Relative: 34.9 % (ref 12.0–46.0)
Lymphs Abs: 2.3 10*3/uL (ref 0.7–4.0)
MCHC: 33.9 g/dL (ref 30.0–36.0)
MCV: 91.5 fl (ref 78.0–100.0)
Monocytes Absolute: 0.6 10*3/uL (ref 0.1–1.0)
Monocytes Relative: 8.5 % (ref 3.0–12.0)
Neutro Abs: 3.1 10*3/uL (ref 1.4–7.7)
Neutrophils Relative %: 47.6 % (ref 43.0–77.0)
Platelets: 209 10*3/uL (ref 150.0–400.0)
RBC: 4.48 Mil/uL (ref 4.22–5.81)
RDW: 14.7 % (ref 11.5–15.5)
WBC: 6.6 10*3/uL (ref 4.0–10.5)

## 2023-12-08 LAB — MICROALBUMIN / CREATININE URINE RATIO
Creatinine,U: 176 mg/dL
Microalb Creat Ratio: 8 mg/g (ref 0.0–30.0)
Microalb, Ur: 1.4 mg/dL (ref 0.0–1.9)

## 2023-12-08 LAB — T4, FREE: Free T4: 0.78 ng/dL (ref 0.60–1.60)

## 2023-12-08 LAB — TSH: TSH: 3.87 u[IU]/mL (ref 0.35–5.50)

## 2023-12-08 LAB — HEMOGLOBIN A1C: Hgb A1c MFr Bld: 6.9 % — ABNORMAL HIGH (ref 4.6–6.5)

## 2023-12-08 MED ORDER — AMLODIPINE BESYLATE 10 MG PO TABS: 10.0000 mg | ORAL_TABLET | Freq: Every day | ORAL | 3 refills | Status: DC

## 2023-12-08 MED ORDER — EZETIMIBE 10 MG PO TABS: 10.0000 mg | ORAL_TABLET | Freq: Every day | ORAL | 3 refills | Status: DC

## 2023-12-08 NOTE — Assessment & Plan Note (Signed)
 Disc goals for lipids and reasons to control them Rev last labs with pt Rev low sat fat diet in detail Taking zetia Cannot take statin due to myopathy  Labs today

## 2023-12-08 NOTE — Assessment & Plan Note (Signed)
 Lab Results  Component Value Date   PSA1 3.1 03/17/2023   PSA 6.67 (H) 10/21/2022   PSA 2.64 12/11/2020   PSA 2.3 11/12/2019    Under  care of urology  History of chronic prostatitis

## 2023-12-08 NOTE — Assessment & Plan Note (Addendum)
 Lab Results  Component Value Date   HGBA1C 6.5 10/21/2022   HGBA1C 7.0 (H) 04/26/2022   HGBA1C 6.8 (A) 04/26/2022  Labs today Microalb today  Up to date eye care Normal foot exam Continues metformin xr 500 mg daily   Intol of statin On ace

## 2023-12-08 NOTE — Patient Instructions (Addendum)
 Stay active  Walk /get steps in or other cardio Add some strength training to your routine, this is important for bone and brain health and can reduce your risk of falls and help your body use insulin properly and regulate weight  Light weights, exercise bands , and internet videos are a good way to start  Yoga (chair or regular), machines , floor exercises or a gym with machines are also good options   Splitting wood counts   Use sun protection outdoors  Labs today   Follow up in August

## 2023-12-08 NOTE — Progress Notes (Signed)
 Subjective:    Patient ID: Jerry [name], male    DOB: 1956-02-11, 68 y.o.   MRN: [id number]  HPI  Here for health maintenance exam and to review chronic medical problems   Wt Readings from Last 3 Encounters:  12/08/23 183 lb 8 oz (83.2 kg)  04/22/23 186 lb (84.4 kg)  03/17/23 185 lb (83.9 kg)   27.90 kg/m  Vitals:   12/08/23 0905  BP: 132/76  Pulse: (!) 56  Temp: 98 F (36.7 C)  SpO2: 97%    Immunization History  Administered Date(s) Administered   Influenza Split 08/05/2011, 10/23/2012, 09/04/2015   Influenza Whole 07/10/2005, 06/21/2008   Influenza,inj,Quad PF,6+ Mos 07/04/2017, 07/27/2018   Pneumococcal Polysaccharide-23 08/14/2005   Td 01/14/2002   Tdap 08/05/2011, 05/07/2019, 06/21/2021    There are no preventive care reminders to display for this patient.  Flu shot - declines  Pna shot -declines   Staying very busy   Prostate health History of chronic prostatitis  Brother had prostate cancer in his 108s   Sees urology - in fall/ psa in summer  Lab Results  Component Value Date   PSA1 3.1 03/17/2023   PSA 6.67 (H) 10/21/2022   PSA 2.64 12/11/2020   PSA 2.3 11/12/2019   Flomax 0.8 mg daily  Cialis 5 mg prn   Doing better overall   Colon cancer screening -colonoscopy 02/2022 - 7 y recall   Bone health   Falls- none  Fractures-none  Supplements    Exercise :  Not enough  Splitting wood regularly 2 days per week / about 6 hours per session  Lifting and carrying a lot   [place]    Mood    12/08/2023    9:08 AM 04/22/2023   10:07 AM 10/28/2022    8:59 AM 08/02/2022   10:33 AM 01/22/2022    9:35 AM  Depression screen PHQ 2/9  Decreased Interest 0 0 0 0 0  Down, Depressed, Hopeless 0 0 0 0 0  PHQ - 2 Score 0 0 0 0 0  Altered sleeping 0  0    Tired, decreased energy 0  1    Change in appetite 0  0    Feeling bad or failure about yourself  0  0    Trouble concentrating 1  0    Moving slowly or fidgety/restless  0  0    Suicidal thoughts 0  0    PHQ-9 Score 1  1    Difficult doing work/chores Not difficult at all  Not difficult at all     HTN bp is stable today  No cp or palpitations or headaches or edema  No side effects to medicines  BP Readings from Last 3 Encounters:  12/08/23 132/76  03/17/23 (!) 143/70  02/18/23 (!) 148/66    Lisinopril 10 mg daily  Amlodipine 10 mg daily    OSA- Using cpap   Lab Results  Component Value Date   NA 141 12/08/2023   K 3.8 12/08/2023   CO2 27 12/08/2023   GLUCOSE 138 (H) 12/08/2023   BUN 17 12/08/2023   CREATININE 1.03 12/08/2023   CALCIUM 9.1 12/08/2023   GFR 74.72 12/08/2023   GFRNONAA >60 02/10/2023     Hyperlipidemia Lab Results  Component Value Date   CHOL 144 12/08/2023   HDL 44.70 12/08/2023   LDLCALC 81 12/08/2023   LDLDIRECT 132.0 12/11/2020   TRIG 90.0 12/08/2023   CHOLHDL 3 12/08/2023  Zetia 10 mg daily  Intol of statin   Dm2 Lab Results  Component Value Date   HGBA1C 6.9 (H) 12/08/2023   HGBA1C 6.5 10/21/2022   HGBA1C 7.0 (H) 04/26/2022   Due for microalb   No big change in eating   Lab Results  Component Value Date   TSH 3.87 12/08/2023   Has had elevated TSH in past    Patient Active Problem List   Diagnosis Date Noted   Digital mucous cyst of finger    Pulsatile tinnitus, left ear 12/20/2021   Elevated TSH 12/18/2020   Statin myopathy 12/18/2020   Prostatitis, chronic 02/06/2018   Family history of prostate cancer 02/06/2018   Type 2 diabetes mellitus without complication, with no history of insulin use (HCC) 01/07/2017   Former smoker 01/07/2017   Aortic atherosclerosis (HCC) 01/06/2017   Elevated PSA 04/24/2016   Routine general medical examination at a health care facility 08/05/2011   Prostate cancer screening 08/05/2011   Tinnitus 09/06/2009   History of colonic polyps 01/16/2009   Obstructive sleep apnea 10/26/2008   Hyperlipidemia associated with type 2 diabetes mellitus (HCC)  03/11/2007   Essential hypertension 03/11/2007   Allergic rhinitis 03/11/2007   Asthma 03/11/2007   Past Medical History:  Diagnosis Date   Allergic rhinitis, cause unspecified    Asthma    Diabetes mellitus without complication (HCC)    Elbow fracture age 85 or 11   left   History of kidney stones    Hx of colonic polyps    Other and unspecified hyperlipidemia    Sleep apnea    Unspecified essential hypertension    Past Surgical History:  Procedure Laterality Date   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   surgery   CHOLECYSTECTOMY     COLONOSCOPY WITH PROPOFOL N/A 03/11/2022   Procedure: COLONOSCOPY WITH PROPOFOL;  Surgeon: Wyline Mood, MD;  Location: [facility];  Service: Gastroenterology;  Laterality: N/A;   FINGER SURGERY Right 02/2022   FRACTURE SURGERY     GANGLION CYST EXCISION Right 01/28/2022   Procedure: RIGHT SMALL FINGER MUCOUS CYST EXCISION;  Surgeon: Marlyne Beards, MD;  Location: [facility];  Service: Orthopedics;  Laterality: Right;   KIDNEY STONE SURGERY     left leg fracture     TONSILLECTOMY AND ADENOIDECTOMY     2000   Social History   Tobacco Use   Smoking status: Former    Current packs/day: 0.00    Average packs/day: 0.5 packs/day for 3.0 years (1.5 ttl pk-yrs)    Types: Cigarettes    Start date: 09/16/1969    Quit date: 09/16/1972    Years since quitting: 51.2   Smokeless tobacco: Never  Vaping Use   Vaping status: Never Used  Substance Use Topics   Alcohol use: No    Alcohol/week: 0.0 standard drinks of alcohol   Drug use: No   Family History  Problem Relation Age of Onset   Lung cancer Father    Diabetes Mother    Pancreatic cancer Paternal Grandfather    Prostate cancer Other    Colon cancer Neg Hx    Allergies  Allergen Reactions   Augmentin [Amoxicillin-Pot Clavulanate] Nausea And Vomiting   Crestor [Rosuvastatin Calcium]     Body pain    Fluticasone Propionate     REACTION: nosebleeds   Current Outpatient  Medications on File Prior to Visit  Medication Sig Dispense Refill   Ascorbic Acid (VITAMIN C) 100 MG tablet Take 100 mg by  mouth daily.     ELDERBERRY PO Take 1 tablet by mouth daily.     fexofenadine (ALLEGRA) 180 MG tablet Take 180 mg by mouth daily.     lisinopril (ZESTRIL) 10 MG tablet TAKE 1 TABLET BY MOUTH EVERY DAY 90 tablet 0   metFORMIN (GLUCOPHAGE-XR) 500 MG 24 hr tablet Take 1 tablet (500 mg total) by mouth daily with breakfast. 90 tablet 3   Multiple Vitamin (MULTIVITAMIN) capsule Take 1 capsule by mouth daily.     Multiple Vitamins-Minerals (ZINC PO) Take 1 tablet by mouth daily.     OVER THE COUNTER MEDICATION Take 1 tablet by mouth daily. Nugenix     phenazopyridine (PYRIDIUM) 100 MG tablet Take 1 tablet (100 mg total) by mouth 3 (three) times daily as needed for pain. 10 tablet 0   tadalafil (CIALIS) 5 MG tablet Take 1 tablet (5 mg total) by mouth daily. (Patient taking differently: Take 5 mg by mouth daily as needed.) 30 tablet 11   tamsulosin (FLOMAX) 0.4 MG CAPS capsule TAKE 2 CAPSULES BY MOUTH EVERY DAY 180 capsule 1   Turmeric (QC TUMERIC COMPLEX PO) Take by mouth.     No current facility-administered medications on file prior to visit.    Review of Systems  Constitutional:  Negative for activity change, appetite change, fatigue, fever and unexpected weight change.  HENT:  Negative for congestion, rhinorrhea, sore throat and trouble swallowing.   Eyes:  Negative for pain, redness, itching and visual disturbance.  Respiratory:  Negative for cough, chest tightness, shortness of breath and wheezing.   Cardiovascular:  Negative for chest pain and palpitations.  Gastrointestinal:  Negative for abdominal pain, blood in stool, constipation, diarrhea and nausea.  Endocrine: Negative for cold intolerance, heat intolerance, polydipsia and polyuria.  Genitourinary:  Negative for difficulty urinating, dysuria, frequency and urgency.       Prostate symptoms are under control   Musculoskeletal:  Negative for arthralgias, joint swelling and myalgias.  Skin:  Negative for pallor and rash.  Neurological:  Negative for dizziness, tremors, weakness, numbness and headaches.  Hematological:  Negative for adenopathy. Does not bruise/bleed easily.  Psychiatric/Behavioral:  Negative for decreased concentration and dysphoric mood. The patient is not nervous/anxious.        Objective:   Physical Exam Constitutional:      General: He is not in acute distress.    Appearance: Normal appearance. He is well-developed and normal weight. He is not ill-appearing or diaphoretic.  HENT:     Head: Normocephalic and atraumatic.     Right Ear: Tympanic membrane, ear canal and external ear normal.     Left Ear: Tympanic membrane, ear canal and external ear normal.     Nose: Nose normal. No congestion.     Mouth/Throat:     Mouth: Mucous membranes are moist.     Pharynx: Oropharynx is clear. No posterior oropharyngeal erythema.  Eyes:     General: No scleral icterus.       Right eye: No discharge.        Left eye: No discharge.     Conjunctiva/sclera: Conjunctivae normal.     Pupils: Pupils are equal, round, and reactive to light.  Neck:     Thyroid: No thyromegaly.     Vascular: No carotid bruit or JVD.  Cardiovascular:     Rate and Rhythm: Normal rate and regular rhythm.     Pulses: Normal pulses.     Heart sounds: Normal heart sounds.  No gallop.  Pulmonary:     Effort: Pulmonary effort is normal. No respiratory distress.     Breath sounds: Normal breath sounds. No wheezing or rales.     Comments: Good air exch Chest:     Chest wall: No tenderness.  Abdominal:     General: Bowel sounds are normal. There is no distension or abdominal bruit.     Palpations: Abdomen is soft. There is no mass.     Tenderness: There is no abdominal tenderness.     Hernia: No hernia is present.  Musculoskeletal:        General: No tenderness.     Cervical back: Normal range of motion  and neck supple. No rigidity. No muscular tenderness.     Right lower leg: No edema.     Left lower leg: No edema.  Lymphadenopathy:     Cervical: No cervical adenopathy.  Skin:    General: Skin is warm and dry.     Coloration: Skin is not pale.     Findings: No erythema or rash.     Comments: Solar lentigines diffusely-worst on scalp  Neurological:     Mental Status: He is alert.     Cranial Nerves: No cranial nerve deficit.     Motor: No abnormal muscle tone.     Coordination: Coordination normal.     Gait: Gait normal.     Deep Tendon Reflexes: Reflexes are normal and symmetric. Reflexes normal.  Psychiatric:        Mood and Affect: Mood normal.        Cognition and Memory: Cognition and memory normal.           Assessment & Plan:   Problem List Items Addressed This Visit       Cardiovascular and Mediastinum   Essential hypertension   bp in fair control at this time  BP Readings from Last 1 Encounters:  12/08/23 132/76   No changes needed Most recent labs reviewed  Disc lifstyle change with low sodium diet and exercise  Plan to continue  Lisinopril 10 mg daily  Amlodipine 10 mg daily   Labs today       Relevant Medications   amLODipine (NORVASC) 10 MG tablet   ezetimibe (ZETIA) 10 MG tablet   Other Relevant Orders   TSH (Completed)   Lipid Panel (Completed)   CBC with Differential/Platelet (Completed)   Comprehensive metabolic panel (Completed)   Aortic atherosclerosis (HCC)   Intol of statins but taking zetia and LDL has been controlled (lipids ordered today)  No symptoms  Bp is also controlled      Relevant Medications   amLODipine (NORVASC) 10 MG tablet   ezetimibe (ZETIA) 10 MG tablet     Respiratory   Obstructive sleep apnea   Continues CPAP and does well with it  Reviewed last pul note         Endocrine   Type 2 diabetes mellitus without complication, with no history of insulin use (HCC)   Lab Results  Component Value Date    HGBA1C 6.5 10/21/2022   HGBA1C 7.0 (H) 04/26/2022   HGBA1C 6.8 (A) 04/26/2022  Labs today Microalb today  Up to date eye care Normal foot exam Continues metformin xr 500 mg daily   Intol of statin On ace        Relevant Orders   Microalbumin / creatinine urine ratio (Completed)   Hemoglobin A1c (Completed)   Hyperlipidemia associated with type 2 diabetes mellitus (HCC)  Disc goals for lipids and reasons to control them Rev last labs with pt Rev low sat fat diet in detail Taking zetia Cannot take statin due to myopathy  Labs today       Relevant Medications   amLODipine (NORVASC) 10 MG tablet   ezetimibe (ZETIA) 10 MG tablet   Other Relevant Orders   Lipid Panel (Completed)     Musculoskeletal and Integument   Statin myopathy   Continues zetia instead of statin for cholesterol        Genitourinary   Prostatitis, chronic   Reviewed last urology note Currently taking flomax 0.8 mg daily and is without symptoms  Lab Results  Component Value Date   PSA1 3.1 03/17/2023   PSA 6.67 (H) 10/21/2022   PSA 2.64 12/11/2020   PSA 2.3 11/12/2019            Other   Routine general medical examination at a health care facility - Primary   Reviewed health habits including diet and exercise and skin cancer prevention Reviewed appropriate screening tests for age  Also reviewed health mt list, fam hx and immunization status , as well as social and family history   See HPI Labs reviewed and ordered Health Maintenance  Topic Date Due   Pneumonia Vaccine (2 of 2 - PCV) 08/14/2006   Complete foot exam   06/18/2022   Hemoglobin A1C  04/21/2023   Yearly kidney health urinalysis for diabetes  04/27/2023   Flu Shot  12/15/2023*   Zoster (Shingles) Vaccine (1 of 2) 03/09/2024*   Hepatitis C Screening  12/07/2024*   COVID-19 Vaccine (1 - 2024-25 season) 12/23/2024*   Yearly kidney function blood test for diabetes  02/10/2024   Medicare Annual Wellness Visit  04/21/2024   Eye  exam for diabetics  06/01/2024   Colon Cancer Screening  03/11/2029   DTaP/Tdap/Td vaccine (5 - Td or Tdap) 06/22/2031   HPV Vaccine  Aged Out  *Topic was postponed. The date shown is not the original due date.   Declines flu and pna and covid imms  Under care of urology for prostate screen Discussed fall prevention, supplements and exercise for bone density  PHQ 1  Labs pending        Prostate cancer screening   Lab Results  Component Value Date   PSA1 3.1 03/17/2023   PSA 6.67 (H) 10/21/2022   PSA 2.64 12/11/2020   PSA 2.3 11/12/2019    Under  care of urology  History of chronic prostatitis        History of colonic polyps   Colonoscopy 02/2022 with 7 y recall No clinical changes       Family history of prostate cancer   Brother in 62s Continues urology care and psa       Elevated TSH   TSH and FT4 ordered No clinical changes       Relevant Orders   TSH (Completed)   T4, free (Completed)

## 2023-12-08 NOTE — Assessment & Plan Note (Signed)
TSH and FT4 ordered No clinical changes

## 2023-12-08 NOTE — Assessment & Plan Note (Signed)
 Continues CPAP and does well with it  Reviewed last pul note

## 2023-12-08 NOTE — Assessment & Plan Note (Signed)
 Reviewed last urology note Currently taking flomax 0.8 mg daily and is without symptoms  Lab Results  Component Value Date   PSA1 3.1 03/17/2023   PSA 6.67 (H) 10/21/2022   PSA 2.64 12/11/2020   PSA 2.3 11/12/2019

## 2023-12-08 NOTE — Assessment & Plan Note (Signed)
 Continues zetia instead of statin for cholesterol

## 2023-12-08 NOTE — Assessment & Plan Note (Signed)
 Reviewed health habits including diet and exercise and skin cancer prevention Reviewed appropriate screening tests for age  Also reviewed health mt list, fam hx and immunization status , as well as social and family history   See HPI Labs reviewed and ordered Health Maintenance  Topic Date Due   Pneumonia Vaccine (2 of 2 - PCV) 08/14/2006   Complete foot exam   06/18/2022   Hemoglobin A1C  04/21/2023   Yearly kidney health urinalysis for diabetes  04/27/2023   Flu Shot  12/15/2023*   Zoster (Shingles) Vaccine (1 of 2) 03/09/2024*   Hepatitis C Screening  12/07/2024*   COVID-19 Vaccine (1 - 2024-25 season) 12/23/2024*   Yearly kidney function blood test for diabetes  02/10/2024   Medicare Annual Wellness Visit  04/21/2024   Eye exam for diabetics  06/01/2024   Colon Cancer Screening  03/11/2029   DTaP/Tdap/Td vaccine (5 - Td or Tdap) 06/22/2031   HPV Vaccine  Aged Out  *Topic was postponed. The date shown is not the original due date.   Declines flu and pna and covid imms  Under care of urology for prostate screen Discussed fall prevention, supplements and exercise for bone density  PHQ 1  Labs pending

## 2023-12-08 NOTE — Assessment & Plan Note (Addendum)
 bp in fair control at this time  BP Readings from Last 1 Encounters:  12/08/23 132/76   No changes needed Most recent labs reviewed  Disc lifstyle change with low sodium diet and exercise  Plan to continue  Lisinopril 10 mg daily  Amlodipine 10 mg daily   Labs today

## 2023-12-08 NOTE — Assessment & Plan Note (Signed)
 Colonoscopy 02/2022 with 7 y recall No clinical changes

## 2023-12-08 NOTE — Assessment & Plan Note (Signed)
 Intol of statins but taking zetia and LDL has been controlled (lipids ordered today)  No symptoms  Bp is also controlled

## 2023-12-08 NOTE — Assessment & Plan Note (Signed)
 Continues follow up with urology  Chronic prostatitis Lab Results  Component Value Date   PSA1 3.1 03/17/2023   PSA 6.67 (H) 10/21/2022   PSA 2.64 12/11/2020   PSA 2.3 11/12/2019

## 2023-12-08 NOTE — Assessment & Plan Note (Signed)
 Brother in 54s Continues urology care and psa

## 2023-12-09 ENCOUNTER — Telehealth: Payer: Self-pay | Admitting: *Deleted

## 2023-12-09 NOTE — Telephone Encounter (Signed)
 Pt is aware of his labs, pt just needs a 3 month f/u appt with PCP scheduled in 3 months. Please schedule

## 2023-12-09 NOTE — Telephone Encounter (Signed)
 Called pt and schedule appt for 3 month f/u appt

## 2023-12-11 DIAGNOSIS — G4733 Obstructive sleep apnea (adult) (pediatric): Secondary | ICD-10-CM | POA: Diagnosis not present

## 2024-01-07 ENCOUNTER — Other Ambulatory Visit: Payer: Self-pay | Admitting: Family Medicine

## 2024-01-22 DIAGNOSIS — Z0100 Encounter for examination of eyes and vision without abnormal findings: Secondary | ICD-10-CM | POA: Diagnosis not present

## 2024-01-22 DIAGNOSIS — H2513 Age-related nuclear cataract, bilateral: Secondary | ICD-10-CM | POA: Diagnosis not present

## 2024-01-22 DIAGNOSIS — H5203 Hypermetropia, bilateral: Secondary | ICD-10-CM | POA: Diagnosis not present

## 2024-01-22 DIAGNOSIS — E119 Type 2 diabetes mellitus without complications: Secondary | ICD-10-CM | POA: Diagnosis not present

## 2024-01-22 DIAGNOSIS — H524 Presbyopia: Secondary | ICD-10-CM | POA: Diagnosis not present

## 2024-01-22 DIAGNOSIS — H25011 Cortical age-related cataract, right eye: Secondary | ICD-10-CM | POA: Diagnosis not present

## 2024-01-22 LAB — HM DIABETES EYE EXAM

## 2024-03-05 ENCOUNTER — Other Ambulatory Visit: Payer: Self-pay | Admitting: Physician Assistant

## 2024-03-09 ENCOUNTER — Telehealth: Payer: Self-pay | Admitting: Physician Assistant

## 2024-03-09 NOTE — Telephone Encounter (Signed)
 Patient is scheduled for a follow up appointment on 03/24/24 as requested. Patient would like to know if he can have a RX sent for Tamsulosin  to last him until this appointment. Please advise

## 2024-03-09 NOTE — Telephone Encounter (Signed)
 Refill sent in

## 2024-03-09 NOTE — Telephone Encounter (Signed)
 Appointment made

## 2024-03-16 ENCOUNTER — Ambulatory Visit: Admitting: Family Medicine

## 2024-03-24 ENCOUNTER — Ambulatory Visit: Payer: Self-pay | Admitting: Physician Assistant

## 2024-03-24 ENCOUNTER — Ambulatory Visit: Admitting: Physician Assistant

## 2024-03-24 ENCOUNTER — Encounter: Payer: Self-pay | Admitting: Physician Assistant

## 2024-03-24 VITALS — BP 130/66 | HR 59 | Ht 69.0 in | Wt 186.0 lb

## 2024-03-24 DIAGNOSIS — N401 Enlarged prostate with lower urinary tract symptoms: Secondary | ICD-10-CM | POA: Diagnosis not present

## 2024-03-24 DIAGNOSIS — R350 Frequency of micturition: Secondary | ICD-10-CM

## 2024-03-24 DIAGNOSIS — N411 Chronic prostatitis: Secondary | ICD-10-CM

## 2024-03-24 LAB — URINALYSIS, COMPLETE
Bilirubin, UA: NEGATIVE
Glucose, UA: NEGATIVE
Ketones, UA: NEGATIVE
Leukocytes,UA: NEGATIVE
Nitrite, UA: NEGATIVE
Protein,UA: NEGATIVE
RBC, UA: NEGATIVE
Specific Gravity, UA: <1.005 — ABNORMAL LOW (ref 1.005–1.030)
Urobilinogen, Ur: 0.2 mg/dL (ref 0.2–1.0)
pH, UA: 6.5 (ref 5.0–7.5)

## 2024-03-24 LAB — MICROSCOPIC EXAMINATION
Epithelial Cells (non renal): NONE SEEN /HPF (ref 0–10)
RBC, Urine: NONE SEEN /HPF (ref 0–2)

## 2024-03-24 MED ORDER — TAMSULOSIN HCL 0.4 MG PO CAPS: 0.8000 mg | ORAL_CAPSULE | Freq: Every day | ORAL | 3 refills | Status: DC

## 2024-03-26 NOTE — Progress Notes (Signed)
 03/24/2024 10:18 AM   Jerry [name] 21, 1957 [id number]  CC: Chief Complaint  Patient presents with   Follow-up   HPI: Jerry [name] is a 68 y.o. male with PMH nephrolithiasis, BPH, and fluctuating PSA in the setting of chronic prostatitis who presents today for follow up.   I saw him most recently in November 2024, at which point he had recurrent irritative voiding symptoms despite 4 weeks of Keflex . At that point, I prescribed 6 weeks of amoxicillin .  Today he reports his symptoms resolved with amoxicillin , though has has been having some perineal soreness over the past couple of days. He would like to leave a UA today in case his infection has returned.  He has nocturia x1-2 at baseline, but has noticed increased nocturia, overnight hesitancy, and frequency up to every 15 minutes in the afternoons if he consumes caffeine, chocolate, cured meats, and soft drinks. He would like to avoid additional medications or procedures at this time, and is content simply avoiding these foods.  IPSS 6/mixed as below.   IPSS     Row Name 03/26/24 1000         International Prostate Symptom Score   How often have you had the sensation of not emptying your bladder? Less than half the time     How often have you had to urinate less than every two hours? Less than half the time     How often have you found you stopped and started again several times when you urinated? Not at All     How often have you found it difficult to postpone urination? Less than 1 in 5 times     How often have you had a weak urinary stream? Less than 1 in 5 times     How often have you had to strain to start urination? Not at All     How many times did you typically get up at night to urinate? None     Total IPSS Score 6       Quality of Life due to urinary symptoms   If you were to spend the rest of your life with your urinary condition just the way it is now how would you feel about that? Mixed          PMH: Past Medical History:  Diagnosis Date   Allergic rhinitis, cause unspecified    Asthma    Diabetes mellitus without complication (HCC)    Elbow fracture age 22 or 75   left   History of kidney stones    Hx of colonic polyps    Other and unspecified hyperlipidemia    Sleep apnea    Unspecified essential hypertension     Surgical History: Past Surgical History:  Procedure Laterality Date   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   surgery   CHOLECYSTECTOMY     COLONOSCOPY WITH PROPOFOL  N/A 03/11/2022   Procedure: COLONOSCOPY WITH PROPOFOL ;  Surgeon: Therisa Bi, MD;  Location: [facility] ENDOSCOPY;  Service: Gastroenterology;  Laterality: N/A;   FINGER SURGERY Right 02/2022   FRACTURE SURGERY     GANGLION CYST EXCISION Right 01/28/2022   Procedure: RIGHT SMALL FINGER MUCOUS CYST EXCISION;  Surgeon: Romona Harari, MD;  Location: [facility];  Service: Orthopedics;  Laterality: Right;   KIDNEY STONE SURGERY     left leg fracture     TONSILLECTOMY AND ADENOIDECTOMY     2000    Home Medications:  Allergies as of 03/24/2024  Reactions   Augmentin [amoxicillin -pot Clavulanate] Nausea And Vomiting   Crestor  [rosuvastatin  Calcium ]    Body pain    Fluticasone  Propionate    REACTION: nosebleeds        Medication List        Accurate as of March 24, 2024 11:59 PM. If you have any questions, ask your nurse or doctor.          amLODipine  10 MG tablet Commonly known as: NORVASC  Take 1 tablet (10 mg total) by mouth daily.   ELDERBERRY PO Take 1 tablet by mouth daily.   ezetimibe  10 MG tablet Commonly known as: ZETIA  Take 1 tablet (10 mg total) by mouth daily.   fexofenadine 180 MG tablet Commonly known as: ALLEGRA Take 180 mg by mouth daily.   lisinopril  10 MG tablet Commonly known as: ZESTRIL  TAKE 1 TABLET BY MOUTH EVERY DAY   metFORMIN  500 MG 24 hr tablet Commonly known as: GLUCOPHAGE -XR TAKE 1 TABLET BY MOUTH EVERY DAY WITH BREAKFAST    multivitamin capsule Take 1 capsule by mouth daily.   OVER THE COUNTER MEDICATION Take 1 tablet by mouth daily. Nugenix   phenazopyridine  100 MG tablet Commonly known as: Pyridium  Take 1 tablet (100 mg total) by mouth 3 (three) times daily as needed for pain.   QC TUMERIC COMPLEX PO Take by mouth.   tadalafil  5 MG tablet Commonly known as: CIALIS  Take 1 tablet (5 mg total) by mouth daily. What changed:  when to take this reasons to take this   tamsulosin  0.4 MG Caps capsule Commonly known as: FLOMAX  Take 2 capsules (0.8 mg total) by mouth daily.   vitamin C 100 MG tablet Take 100 mg by mouth daily.   ZINC PO Take 1 tablet by mouth daily.        Allergies:  Allergies  Allergen Reactions   Augmentin [Amoxicillin -Pot Clavulanate] Nausea And Vomiting   Crestor  [Rosuvastatin  Calcium ]     Body pain    Fluticasone  Propionate     REACTION: nosebleeds    Family History: Family History  Problem Relation Age of Onset   Lung cancer Father    Diabetes Mother    Pancreatic cancer Paternal Grandfather    Prostate cancer Other    Colon cancer Neg Hx     Social History:   reports that he quit smoking about 51 years ago. His smoking use included cigarettes. He started smoking about 54 years ago. He has a 1.5 pack-year smoking history. He has never used smokeless tobacco. He reports that he does not drink alcohol and does not use drugs.  Physical Exam: BP 130/66   Pulse (!) 59   Ht 5' 9 (1.753 m)   Wt 186 lb (84.4 kg)   BMI 27.47 kg/m   Constitutional:  Alert and oriented, no acute distress, nontoxic appearing HEENT: [place], AT Cardiovascular: No clubbing, cyanosis, or edema Respiratory: Normal respiratory effort, no increased work of breathing Skin: No rashes, bruises or suspicious lesions Neurologic: Grossly intact, no focal deficits, moving all 4 extremities Psychiatric: Normal mood and affect  Assessment & Plan:   1. Benign prostatic hyperplasia with urinary  frequency (Primary) Very responsive to bladder irritants. I offered him cysto or OAB meds, but he prefers to just avoid his triggers, which is reasonable. Will continue Flomax  0.8mg  daily. I hesitate to start finasteride due to antiandrogenic side effects and he agrees with this. - tamsulosin  (FLOMAX ) 0.4 MG CAPS capsule; Take 2 capsules (0.8 mg total) by mouth daily.  Dispense: 180 capsule; Refill: 3  2. Chronic prostatitis Responded well to 6 weeks of amoxicillin  late last year. With perineal soreness this week, will check a UA and contact him with results. He's due for screening PSA, but I deferred this today in case his symptoms indicate a prostatitis flare that would falsely elevate his results. - Urinalysis, Complete  Return for Will call with results.  Lucie Hones, PA-C  [facility] [place] [address], [address] [place], [place] [address] [phone]

## 2024-04-07 ENCOUNTER — Other Ambulatory Visit: Payer: Self-pay | Admitting: Family Medicine

## 2024-04-08 DIAGNOSIS — G4733 Obstructive sleep apnea (adult) (pediatric): Secondary | ICD-10-CM | POA: Diagnosis not present

## 2024-04-26 ENCOUNTER — Ambulatory Visit (INDEPENDENT_AMBULATORY_CARE_PROVIDER_SITE_OTHER): Payer: Medicare HMO

## 2024-04-26 VITALS — BP 130/66 | Ht 69.0 in | Wt 185.0 lb

## 2024-04-26 DIAGNOSIS — Z0000 Encounter for general adult medical examination without abnormal findings: Secondary | ICD-10-CM | POA: Diagnosis not present

## 2024-04-26 NOTE — Patient Instructions (Signed)
 [name] , Thank you for taking time out of your busy schedule to complete your Annual Wellness Visit with me. I enjoyed our conversation and look forward to speaking with you again next year. I, as well as your care team,  appreciate your ongoing commitment to your health goals. Please review the following plan we discussed and let me know if I can assist you in the future. Your Game plan/ To Do List    Referrals: If you haven't heard from the office you've been referred to, please reach out to them at the phone provided.   Follow up Visits: We will see or speak with you next year for your Next Medicare AWV with our clinical staff Have you seen your provider in the last 6 months (3 months if uncontrolled diabetes)? Yes  Clinician Recommendations:  Aim for 30 minutes of exercise or brisk walking, 6-8 glasses of water , and 5 servings of fruits and vegetables each day.       This is a list of the screenings recommended for you:  Health Maintenance  Topic Date Due   Zoster (Shingles) Vaccine (1 of 2) Never done   Flu Shot  04/16/2024   Pneumococcal Vaccine for age over 23 (2 of 2 - PCV) 12/07/2024*   Hepatitis C Screening  12/07/2024*   COVID-19 Vaccine (1 - 2024-25 season) 12/23/2024*   Hemoglobin A1C  06/09/2024   Yearly kidney function blood test for diabetes  12/07/2024   Yearly kidney health urinalysis for diabetes  12/07/2024   Complete foot exam   12/07/2024   Eye exam for diabetics  01/21/2025   Medicare Annual Wellness Visit  04/26/2025   Colon Cancer Screening  03/11/2029   DTaP/Tdap/Td vaccine (5 - Td or Tdap) 06/22/2031   Hepatitis B Vaccine  Aged Out   HPV Vaccine  Aged Out   Meningitis B Vaccine  Aged Out  *Topic was postponed. The date shown is not the original due date.    Advanced directives: (Declined) Advance directive discussed with you today. Even though you declined this today, please call our office should you change your mind, and we can give you the proper  paperwork for you to fill out. Advance Care Planning is important because it:  [x]  Makes sure you receive the medical care that is consistent with your values, goals, and preferences  [x]  It provides guidance to your family and loved ones and reduces their decisional burden about whether or not they are making the right decisions based on your wishes.  Follow the link provided in your after visit summary or read over the paperwork we have mailed to you to help you started getting your Advance Directives in place. If you need assistance in completing these, please reach out to us  so that we can help you!  See attachments for Preventive Care and Fall Prevention Tips.

## 2024-04-26 NOTE — Progress Notes (Signed)
 Because this visit was a virtual/telehealth visit,  certain criteria was not obtained, such a blood pressure, CBG if applicable, and timed get up and go. Any medications not marked as taking were not mentioned during the medication reconciliation part of the visit. Any vitals not documented were not able to be obtained due to this being a telehealth visit or patient was unable to self-report a recent blood pressure reading due to a lack of equipment at home via telehealth. Vitals that have been documented are verbally provided by the patient.  This visit was performed by a medical professional under my direct supervision. I was immediately available for consultation/collaboration. I have reviewed and agree with the Annual Wellness Visit documentation.  Subjective:   Jerry [name] is a 68 y.o. who presents for a Medicare Wellness preventive visit.  As a reminder, Annual Wellness Visits don't include a physical exam, and some assessments may be limited, especially if this visit is performed virtually. We may recommend an in-person follow-up visit with your provider if needed.  Visit Complete: Virtual I connected with  Markale P Corker on 04/26/24 by a audio enabled telemedicine application and verified that I am speaking with the correct person using two identifiers.  Patient Location: Home  Provider Location: Home Office  I discussed the limitations of evaluation and management by telemedicine. The patient expressed understanding and agreed to proceed.  Vital Signs: Because this visit was a virtual/telehealth visit, some criteria may be missing or patient reported. Any vitals not documented were not able to be obtained and vitals that have been documented are patient reported.  VideoDeclined- This patient declined Librarian, academic. Therefore the visit was completed with audio only.  Persons Participating in Visit: Patient.  AWV Questionnaire: No: Patient  Medicare AWV questionnaire was not completed prior to this visit.  Cardiac Risk Factors include: advanced age (>35men, >33 women);male gender;diabetes mellitus;hypertension;dyslipidemia     Objective:    Today's Vitals   04/26/24 1136  BP: 130/66  Weight: 185 lb (83.9 kg)  Height: 5' 9 (1.753 m)   Body mass index is 27.32 kg/m.     04/26/2024   11:40 AM 04/22/2023   10:08 AM 02/10/2023    9:42 AM 03/11/2022    7:01 AM 01/28/2022   11:37 AM 01/22/2022    9:41 AM 01/18/2022    1:41 PM  Advanced Directives  Does Patient Have a Medical Advance Directive? No No No No  No No  Would patient like information on creating a medical advance directive? No - Patient declined No - Patient declined No - Patient declined  No - Patient declined  No - Patient declined    Current Medications (verified) Outpatient Encounter Medications as of 04/26/2024  Medication Sig   amLODipine  (NORVASC ) 10 MG tablet Take 1 tablet (10 mg total) by mouth daily.   Ascorbic Acid (VITAMIN C) 100 MG tablet Take 100 mg by mouth daily.   ELDERBERRY PO Take 1 tablet by mouth daily.   ezetimibe  (ZETIA ) 10 MG tablet Take 1 tablet (10 mg total) by mouth daily.   fexofenadine (ALLEGRA) 180 MG tablet Take 180 mg by mouth daily.   lisinopril  (ZESTRIL ) 10 MG tablet TAKE 1 TABLET BY MOUTH EVERY DAY   metFORMIN  (GLUCOPHAGE -XR) 500 MG 24 hr tablet TAKE 1 TABLET BY MOUTH EVERY DAY WITH BREAKFAST   Multiple Vitamin (MULTIVITAMIN) capsule Take 1 capsule by mouth daily.   Multiple Vitamins-Minerals (ZINC PO) Take 1 tablet by mouth daily.  OVER THE COUNTER MEDICATION Take 1 tablet by mouth daily. Nugenix   phenazopyridine  (PYRIDIUM ) 100 MG tablet Take 1 tablet (100 mg total) by mouth 3 (three) times daily as needed for pain.   tadalafil  (CIALIS ) 5 MG tablet Take 1 tablet (5 mg total) by mouth daily. (Patient taking differently: Take 5 mg by mouth daily as needed.)   tamsulosin  (FLOMAX ) 0.4 MG CAPS capsule Take 2 capsules (0.8 mg total)  by mouth daily.   Turmeric (QC TUMERIC COMPLEX PO) Take by mouth.   No facility-administered encounter medications on file as of 04/26/2024.    Allergies (verified) Augmentin [amoxicillin -pot clavulanate], Crestor  [rosuvastatin  calcium ], and Fluticasone  propionate   History: Past Medical History:  Diagnosis Date   Allergic rhinitis, cause unspecified    Asthma    Diabetes mellitus without complication (HCC)    Elbow fracture age 50 or 47   left   History of kidney stones    Hx of colonic polyps    Other and unspecified hyperlipidemia    Sleep apnea    Unspecified essential hypertension    Past Surgical History:  Procedure Laterality Date   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   surgery   CHOLECYSTECTOMY     COLONOSCOPY WITH PROPOFOL  N/A 03/11/2022   Procedure: COLONOSCOPY WITH PROPOFOL ;  Surgeon: Therisa Bi, MD;  Location: [facility] ENDOSCOPY;  Service: Gastroenterology;  Laterality: N/A;   FINGER SURGERY Right 02/2022   FRACTURE SURGERY     GANGLION CYST EXCISION Right 01/28/2022   Procedure: RIGHT SMALL FINGER MUCOUS CYST EXCISION;  Surgeon: Romona Harari, MD;  Location: [facility];  Service: Orthopedics;  Laterality: Right;   KIDNEY STONE SURGERY     left leg fracture     TONSILLECTOMY AND ADENOIDECTOMY     2000   Family History  Problem Relation Age of Onset   Lung cancer Father    Diabetes Mother    Pancreatic cancer Paternal Grandfather    Prostate cancer Other    Colon cancer Neg Hx    Social History   Socioeconomic History   Marital status: Married    Spouse name: Not on file   Number of children: 3   Years of education: Not on file   Highest education level: Not on file  Occupational History   Occupation: Pastor  Tobacco Use   Smoking status: Former    Current packs/day: 0.00    Average packs/day: 0.5 packs/day for 3.0 years (1.5 ttl pk-yrs)    Types: Cigarettes    Start date: 09/16/1969    Quit date: 09/16/1972    Years since quitting:  51.6   Smokeless tobacco: Never  Vaping Use   Vaping status: Never Used  Substance and Sexual Activity   Alcohol use: No    Alcohol/week: 0.0 standard drinks of alcohol   Drug use: No   Sexual activity: Not Currently  Other Topics Concern   Not on file  Social History Narrative   Not on file   Social Drivers of Health   Financial Resource Strain: Low Risk  (04/26/2024)   Overall Financial Resource Strain (CARDIA)    Difficulty of Paying Living Expenses: Not hard at all  Food Insecurity: No Food Insecurity (04/26/2024)   Hunger Vital Sign    Worried About Running Out of Food in the Last Year: Never true    Ran Out of Food in the Last Year: Never true  Transportation Needs: No Transportation Needs (04/26/2024)   PRAPARE - Transportation  Lack of Transportation (Medical): No    Lack of Transportation (Non-Medical): No  Physical Activity: Insufficiently Active (04/26/2024)   Exercise Vital Sign    Days of Exercise per Week: 2 days    Minutes of Exercise per Session: 30 min  Stress: No Stress Concern Present (04/26/2024)   [employer] of Occupational Health - Occupational Stress Questionnaire    Feeling of Stress: Not at all  Social Connections: Socially Integrated (04/26/2024)   Social Connection and Isolation Panel    Frequency of Communication with Friends and Family: More than three times a week    Frequency of Social Gatherings with Friends and Family: More than three times a week    Attends Religious Services: More than 4 times per year    Active Member of [employer] or Organizations: Yes    Attends Engineer, structural: More than 4 times per year    Marital Status: Married    Tobacco Counseling Counseling given: Not Answered    Clinical Intake:  Pre-visit preparation completed: Yes  Pain : No/denies pain     BMI - recorded: 27.32 Nutritional Status: BMI 25 -29 Overweight Nutritional Risks: None Diabetes: Yes CBG done?: No Did pt. bring in CBG  monitor from home?: No  Lab Results  Component Value Date   HGBA1C 6.9 (H) 12/08/2023   HGBA1C 6.5 10/21/2022   HGBA1C 7.0 (H) 04/26/2022     How often do you need to have someone help you when you read instructions, pamphlets, or other written materials from your doctor or pharmacy?: 1 - Never What is the last grade level you completed in school?: Degree  Interpreter Needed?: No  Information entered by :: Lorielle Boehning,CMA   Activities of Daily Living     04/26/2024   11:39 AM  In your present state of health, do you have any difficulty performing the following activities:  Hearing? 0  Vision? 0  Difficulty concentrating or making decisions? 0  Walking or climbing stairs? 0  Dressing or bathing? 0  Doing errands, shopping? 0  Preparing Food and eating ? N  Using the Toilet? N  In the past six months, have you accidently leaked urine? N  Do you have problems with loss of bowel control? N  Managing your Medications? N  Managing your Finances? N  Housekeeping or managing your Housekeeping? N    Patient Care Team: Tower, Laine LABOR, MD as PCP - General Gollan, Evalene PARAS, MD as Consulting Physician (Cardiology)  I have updated your Care Teams any recent Medical Services you may have received from other providers in the past year.     Assessment:   This is a routine wellness examination for Moishy.  Hearing/Vision screen Hearing Screening - Comments:: No difficulties  Vision Screening - Comments:: Patient has glasses    Goals Addressed             This Visit's Progress    Patient Stated   On track    Lose weight       Depression Screen     04/26/2024   11:41 AM 12/08/2023    9:08 AM 04/22/2023   10:07 AM 10/28/2022    8:59 AM 08/02/2022   10:33 AM 01/22/2022    9:35 AM 12/18/2020    9:30 AM  PHQ 2/9 Scores  PHQ - 2 Score 0 0 0 0 0 0 0  PHQ- 9 Score 0 1  1       Fall Risk  04/26/2024   11:40 AM 12/08/2023    9:08 AM 04/16/2023   10:04 AM 10/28/2022     8:59 AM 08/02/2022   10:32 AM  Fall Risk   Falls in the past year? 0 0 0 0 0  Number falls in past yr: 0 0 0 0 0  Injury with Fall? 0 0 0 0 0  Risk for fall due to : No Fall Risks No Fall Risks No Fall Risks No Fall Risks No Fall Risks  Follow up Falls evaluation completed Falls evaluation completed Falls evaluation completed;Falls prevention discussed Falls evaluation completed Falls evaluation completed      Data saved with a previous flowsheet row definition    MEDICARE RISK AT HOME:  Medicare Risk at Home Any stairs in or around the home?: Yes If so, are there any without handrails?: No Home free of loose throw rugs in walkways, pet beds, electrical cords, etc?: Yes Adequate lighting in your home to reduce risk of falls?: Yes Life alert?: No Use of a cane, walker or w/c?: No Grab bars in the bathroom?: No Shower chair or bench in shower?: No Elevated toilet seat or a handicapped toilet?: No  TIMED UP AND GO:  Was the test performed?  No  Cognitive Function: 6CIT completed        04/26/2024   11:38 AM 04/22/2023   10:09 AM 01/22/2022    9:33 AM  6CIT Screen  What Year? 0 points 0 points 0 points  What month? 0 points 0 points 0 points  What time? 0 points 0 points 0 points  Count back from 20 0 points 0 points 0 points  Months in reverse 0 points 0 points 0 points  Repeat phrase 0 points 0 points 0 points  Total Score 0 points 0 points 0 points    Immunizations Immunization History  Administered Date(s) Administered   Influenza Split 08/05/2011, 10/23/2012, 09/04/2015   Influenza Whole 07/10/2005, 06/21/2008   Influenza,inj,Quad PF,6+ Mos 07/04/2017, 07/27/2018   Pneumococcal Polysaccharide-23 08/14/2005   Td 01/14/2002   Tdap 08/05/2011, 05/07/2019, 06/21/2021    Screening Tests Health Maintenance  Topic Date Due   Zoster Vaccines- Shingrix (1 of 2) Never done   INFLUENZA VACCINE  04/16/2024   Pneumococcal Vaccine: 50+ Years (2 of 2 - PCV) 12/07/2024  (Originally 08/14/2006)   Hepatitis C Screening  12/07/2024 (Originally 07/28/1974)   COVID-19 Vaccine (1 - 2024-25 season) 12/23/2024 (Originally 05/18/2023)   HEMOGLOBIN A1C  06/09/2024   Diabetic kidney evaluation - eGFR measurement  12/07/2024   Diabetic kidney evaluation - Urine ACR  12/07/2024   FOOT EXAM  12/07/2024   OPHTHALMOLOGY EXAM  01/21/2025   Medicare Annual Wellness (AWV)  04/26/2025   Colonoscopy  03/11/2029   DTaP/Tdap/Td (5 - Td or Tdap) 06/22/2031   Hepatitis B Vaccines  Aged Out   HPV VACCINES  Aged Out   Meningococcal B Vaccine  Aged Out    Health Maintenance  Health Maintenance Due  Topic Date Due   Zoster Vaccines- Shingrix (1 of 2) Never done   INFLUENZA VACCINE  04/16/2024   Health Maintenance Items Addressed:patient declined vaccinations  Additional Screening:  Vision Screening: Recommended annual ophthalmology exams for early detection of glaucoma and other disorders of the eye. Would you like a referral to an eye doctor? No    Dental Screening: Recommended annual dental exams for proper oral hygiene  Community Resource Referral / Chronic Care Management: CRR required this visit?  No   CCM  required this visit?  No   Plan:    I have personally reviewed and noted the following in the patient's chart:   Medical and social history Use of alcohol, tobacco or illicit drugs  Current medications and supplements including opioid prescriptions. Patient is not currently taking opioid prescriptions. Functional ability and status Nutritional status Physical activity Advanced directives List of other physicians Hospitalizations, surgeries, and ER visits in previous 12 months Vitals Screenings to include cognitive, depression, and falls Referrals and appointments  In addition, I have reviewed and discussed with patient certain preventive protocols, quality metrics, and best practice recommendations. A written personalized care plan for preventive  services as well as general preventive health recommendations were provided to patient.   Lyle MARLA Right, [place]   04/26/2024   After Visit Summary: (MyChart) Due to this being a telephonic visit, the after visit summary with patients personalized plan was offered to patient via MyChart   Notes: Nothing significant to report at this time.

## 2024-05-10 ENCOUNTER — Ambulatory Visit: Admitting: Family Medicine

## 2024-06-04 ENCOUNTER — Ambulatory Visit: Admitting: Family Medicine

## 2024-06-14 ENCOUNTER — Ambulatory Visit (INDEPENDENT_AMBULATORY_CARE_PROVIDER_SITE_OTHER): Admitting: Family Medicine

## 2024-06-14 ENCOUNTER — Encounter: Payer: Self-pay | Admitting: Family Medicine

## 2024-06-14 VITALS — BP 119/70 | HR 54 | Temp 97.6°F | Ht 69.0 in | Wt 185.0 lb

## 2024-06-14 DIAGNOSIS — E119 Type 2 diabetes mellitus without complications: Secondary | ICD-10-CM

## 2024-06-14 DIAGNOSIS — I10 Essential (primary) hypertension: Secondary | ICD-10-CM | POA: Diagnosis not present

## 2024-06-14 DIAGNOSIS — Z7984 Long term (current) use of oral hypoglycemic drugs: Secondary | ICD-10-CM | POA: Insufficient documentation

## 2024-06-14 DIAGNOSIS — E785 Hyperlipidemia, unspecified: Secondary | ICD-10-CM

## 2024-06-14 DIAGNOSIS — E1169 Type 2 diabetes mellitus with other specified complication: Secondary | ICD-10-CM

## 2024-06-14 LAB — POCT GLYCOSYLATED HEMOGLOBIN (HGB A1C): Hemoglobin A1C: 6.8 % — AB (ref 4.0–5.6)

## 2024-06-14 NOTE — Assessment & Plan Note (Signed)
 Lab Results  Component Value Date   HGBA1C 6.8 (A) 06/14/2024   HGBA1C 6.9 (H) 12/08/2023   HGBA1C 6.5 10/21/2022   Stable Microalb utd  Up to date eye care Good foot care Commended better diet  Continues metformin  xr 500 mg daily   Intol of statin On ace

## 2024-06-14 NOTE — Patient Instructions (Addendum)
 Add some strength training to your routine, this is important for bone and brain health and can reduce your risk of falls and help your body use insulin properly and regulate weight  Light weights, exercise bands , and internet videos are a good way to start  Yoga (chair or regular), machines , floor exercises or a gym with machines are also good options   Take care of yourself    Try to get most of your carbohydrates from produce (with the exception of white potatoes) and whole grains Eat less bread/pasta/rice/snack foods/cereals/sweets and other items from the middle of the grocery store (processed carbs)

## 2024-06-14 NOTE — Assessment & Plan Note (Signed)
 bp in fair control at this time  BP Readings from Last 1 Encounters:  06/14/24 119/70   No changes needed Most recent labs reviewed  Disc lifstyle change with low sodium diet and exercise  Plan to continue  Lisinopril  10 mg daily  Amlodipine  10 mg daily

## 2024-06-14 NOTE — Assessment & Plan Note (Signed)
 Disc goals for lipids and reasons to control them Rev last labs with pt Rev low sat fat diet in detail Taking zetia  Cannot take statin due to myopathy

## 2024-06-14 NOTE — Progress Notes (Signed)
 Subjective:    Patient ID: Jerry [name], male    DOB: 1956-08-26, 68 y.o.   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   Pt presents for follow up of  DM2 HTN Lipids   HTN bp is stable today  No cp or palpitations or headaches or edema  No side effects to medicines  BP Readings from Last 3 Encounters:  06/14/24 119/70  04/26/24 130/66  03/24/24 130/66    Lisinopril  10 mg daily  Amlodipine  10 mg daily   Lab Results  Component Value Date   NA 141 12/08/2023   K 3.8 12/08/2023   CO2 27 12/08/2023   GLUCOSE 138 (H) 12/08/2023   BUN 17 12/08/2023   CREATININE 1.03 12/08/2023   CALCIUM  9.1 12/08/2023   GFR 74.72 12/08/2023   GFRNONAA >60 02/10/2023    DM2 Diabetes Home sugar results   DM diet  Eating fairly well  Trying to stick to DM diet  Less sugar overall  Gets veggies   Exercise  Not enough  Needs to make time for that   Metformin  xr 500 mg daily  No problems   A1c 6.8 today   Lab Results  Component Value Date   HGBA1C 6.8 (A) 06/14/2024   HGBA1C 6.9 (H) 12/08/2023   HGBA1C 6.5 10/21/2022   Lab Results  Component Value Date   LABMICR Comment 03/24/2024   LABMICR See below: 07/25/2023   MICROALBUR 1.4 12/08/2023   MICROALBUR 12.2 11/12/2019    Renal protection Last eye exam =recent   Hyperlipidemia Lab Results  Component Value Date   CHOL 144 12/08/2023   HDL 44.70 12/08/2023   LDLCALC 81 12/08/2023   LDLDIRECT 132.0 12/11/2020   TRIG 90.0 12/08/2023   CHOLHDL 3 12/08/2023   Intol of statin  Takes zetia     Lab Results  Component Value Date   TSH 3.87 12/08/2023      Patient Active Problem List   Diagnosis Date Noted   Long term current use  of oral hypoglycemic drug 06/14/2024   Digital mucous cyst of finger    Elevated TSH 12/18/2020   Statin myopathy 12/18/2020   Prostatitis, chronic 02/06/2018   Family history of prostate cancer 02/06/2018   Type 2 diabetes mellitus without complication, with no history of insulin use (HCC) 01/07/2017   Former smoker 01/07/2017   Aortic atherosclerosis 01/06/2017   Elevated PSA 04/24/2016   Routine general medical examination at a health care facility 08/05/2011   Prostate cancer screening 08/05/2011   Tinnitus 09/06/2009   History of colonic polyps 01/16/2009   Obstructive sleep apnea 10/26/2008   Hyperlipidemia associated with type 2 diabetes mellitus (HCC) 03/11/2007   Essential hypertension 03/11/2007   Allergic rhinitis 03/11/2007   Asthma 03/11/2007   Past Medical History:  Diagnosis Date   Allergic rhinitis, cause unspecified    Asthma    Diabetes mellitus without complication (HCC)    Elbow fracture age 50 or 11   left   History of kidney stones    Hx of colonic polyps    Other and unspecified hyperlipidemia    Sleep apnea  Unspecified essential hypertension    Past Surgical History:  Procedure Laterality Date   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   surgery   CHOLECYSTECTOMY     COLONOSCOPY WITH PROPOFOL  N/A 03/11/2022   Procedure: COLONOSCOPY WITH PROPOFOL ;  Surgeon: Therisa Bi, MD;  Location: [facility] ENDOSCOPY;  Service: Gastroenterology;  Laterality: N/A;   FINGER SURGERY Right 02/2022   FRACTURE SURGERY     GANGLION CYST EXCISION Right 01/28/2022   Procedure: RIGHT SMALL FINGER MUCOUS CYST EXCISION;  Surgeon: Romona Harari, MD;  Location: [facility];  Service: Orthopedics;  Laterality: Right;   KIDNEY STONE SURGERY     left leg fracture     TONSILLECTOMY AND ADENOIDECTOMY     2000   Social History   Tobacco Use   Smoking status: Former    Current packs/day: 0.00    Average packs/day: 0.5 packs/day for 3.0 years (1.5 ttl pk-yrs)     Types: Cigarettes    Start date: 09/16/1969    Quit date: 09/16/1972    Years since quitting: 51.7   Smokeless tobacco: Never  Vaping Use   Vaping status: Never Used  Substance Use Topics   Alcohol use: No    Alcohol/week: 0.0 standard drinks of alcohol   Drug use: No   Family History  Problem Relation Age of Onset   Lung cancer Father    Diabetes Mother    Pancreatic cancer Paternal Grandfather    Prostate cancer Other    Colon cancer Neg Hx    Allergies  Allergen Reactions   Augmentin [Amoxicillin -Pot Clavulanate] Nausea And Vomiting   Crestor  [Rosuvastatin  Calcium ]     Body pain    Fluticasone  Propionate     REACTION: nosebleeds   Current Outpatient Medications on File Prior to Visit  Medication Sig Dispense Refill   amLODipine  (NORVASC ) 10 MG tablet Take 1 tablet (10 mg total) by mouth daily. 90 tablet 3   Ascorbic Acid (VITAMIN C) 100 MG tablet Take 100 mg by mouth daily.     ELDERBERRY PO Take 1 tablet by mouth daily.     ezetimibe  (ZETIA ) 10 MG tablet Take 1 tablet (10 mg total) by mouth daily. 90 tablet 3   fexofenadine (ALLEGRA) 180 MG tablet Take 180 mg by mouth daily.     lisinopril  (ZESTRIL ) 10 MG tablet TAKE 1 TABLET BY MOUTH EVERY DAY 90 tablet 0   metFORMIN  (GLUCOPHAGE -XR) 500 MG 24 hr tablet TAKE 1 TABLET BY MOUTH EVERY DAY WITH BREAKFAST 90 tablet 0   Multiple Vitamin (MULTIVITAMIN) capsule Take 1 capsule by mouth daily.     Multiple Vitamins-Minerals (ZINC PO) Take 1 tablet by mouth daily.     OVER THE COUNTER MEDICATION Take 1 tablet by mouth daily. Nugenix     tamsulosin  (FLOMAX ) 0.4 MG CAPS capsule Take 2 capsules (0.8 mg total) by mouth daily. 180 capsule 3   Turmeric (QC TUMERIC COMPLEX PO) Take by mouth.     No current facility-administered medications on file prior to visit.    Review of Systems  Constitutional:  Negative for activity change, appetite change, fatigue, fever and unexpected weight change.  HENT:  Negative for congestion, rhinorrhea,  sore throat and trouble swallowing.   Eyes:  Negative for pain, redness, itching and visual disturbance.  Respiratory:  Negative for cough, chest tightness, shortness of breath and wheezing.   Cardiovascular:  Negative for chest pain and palpitations.  Gastrointestinal:  Negative for abdominal pain, blood in stool, constipation, diarrhea and nausea.  Endocrine: Negative for cold intolerance, heat intolerance, polydipsia and polyuria.  Genitourinary:  Negative for difficulty urinating, dysuria, frequency and urgency.  Musculoskeletal:  Negative for arthralgias, joint swelling and myalgias.  Skin:  Negative for pallor and rash.  Neurological:  Negative for dizziness, tremors, weakness, numbness and headaches.  Hematological:  Negative for adenopathy. Does not bruise/bleed easily.  Psychiatric/Behavioral:  Negative for decreased concentration and dysphoric mood. The patient is not nervous/anxious.        Objective:   Physical Exam Constitutional:      General: He is not in acute distress.    Appearance: Normal appearance. He is well-developed and normal weight. He is not ill-appearing or diaphoretic.  HENT:     Head: Normocephalic and atraumatic.  Eyes:     Conjunctiva/sclera: Conjunctivae normal.     Pupils: Pupils are equal, round, and reactive to light.  Neck:     Thyroid : No thyromegaly.     Vascular: No carotid bruit or JVD.  Cardiovascular:     Rate and Rhythm: Normal rate and regular rhythm.     Heart sounds: Normal heart sounds.     No gallop.  Pulmonary:     Effort: Pulmonary effort is normal. No respiratory distress.     Breath sounds: Normal breath sounds. No wheezing or rales.  Abdominal:     General: There is no distension or abdominal bruit.     Palpations: Abdomen is soft.  Musculoskeletal:     Cervical back: Normal range of motion and neck supple.     Right lower leg: No edema.     Left lower leg: No edema.  Lymphadenopathy:     Cervical: No cervical  adenopathy.  Skin:    General: Skin is warm and dry.     Coloration: Skin is not pale.     Findings: No rash.  Neurological:     Mental Status: He is alert.     Coordination: Coordination normal.     Deep Tendon Reflexes: Reflexes are normal and symmetric. Reflexes normal.  Psychiatric:        Mood and Affect: Mood normal.           Assessment & Plan:   Problem List Items Addressed This Visit       Cardiovascular and Mediastinum   Essential hypertension   bp in fair control at this time  BP Readings from Last 1 Encounters:  06/14/24 119/70   No changes needed Most recent labs reviewed  Disc lifstyle change with low sodium diet and exercise  Plan to continue  Lisinopril  10 mg daily  Amlodipine  10 mg daily           Endocrine   Type 2 diabetes mellitus without complication, with no history of insulin use (HCC) - Primary   Lab Results  Component Value Date   HGBA1C 6.8 (A) 06/14/2024   HGBA1C 6.9 (H) 12/08/2023   HGBA1C 6.5 10/21/2022   Stable Microalb utd  Up to date eye care Good foot care Commended better diet  Continues metformin  xr 500 mg daily   Intol of statin On ace        Relevant Orders   POCT HgB A1C (Completed)   Hyperlipidemia associated with type 2 diabetes mellitus (HCC)   Disc goals for lipids and reasons to control them Rev last labs with pt Rev low sat fat diet in detail Taking zetia  Cannot take statin due to myopathy          Other  Long term current use of oral hypoglycemic drug

## 2024-07-06 ENCOUNTER — Other Ambulatory Visit: Payer: Self-pay | Admitting: Family Medicine

## 2024-07-07 DIAGNOSIS — G4733 Obstructive sleep apnea (adult) (pediatric): Secondary | ICD-10-CM | POA: Diagnosis not present

## 2024-09-22 ENCOUNTER — Ambulatory Visit (INDEPENDENT_AMBULATORY_CARE_PROVIDER_SITE_OTHER): Admitting: Urology

## 2024-09-22 ENCOUNTER — Encounter: Payer: Self-pay | Admitting: Urology

## 2024-09-22 ENCOUNTER — Telehealth: Payer: Self-pay | Admitting: Cardiovascular Disease

## 2024-09-22 VITALS — BP 133/71 | HR 51 | Ht 69.5 in | Wt 180.0 lb

## 2024-09-22 DIAGNOSIS — R350 Frequency of micturition: Secondary | ICD-10-CM

## 2024-09-22 DIAGNOSIS — Z87898 Personal history of other specified conditions: Secondary | ICD-10-CM

## 2024-09-22 DIAGNOSIS — N401 Enlarged prostate with lower urinary tract symptoms: Secondary | ICD-10-CM

## 2024-09-22 DIAGNOSIS — N411 Chronic prostatitis: Secondary | ICD-10-CM

## 2024-09-22 LAB — URINALYSIS, COMPLETE
Bilirubin, UA: NEGATIVE
Glucose, UA: NEGATIVE
Ketones, UA: NEGATIVE
Leukocytes,UA: NEGATIVE
Nitrite, UA: NEGATIVE
Protein,UA: NEGATIVE
RBC, UA: NEGATIVE
Specific Gravity, UA: 1.02 (ref 1.005–1.030)
Urobilinogen, Ur: 0.2 mg/dL (ref 0.2–1.0)
pH, UA: 5.5 (ref 5.0–7.5)

## 2024-09-22 LAB — MICROSCOPIC EXAMINATION: Bacteria, UA: NONE SEEN

## 2024-09-22 MED ORDER — TAMSULOSIN HCL 0.4 MG PO CAPS: 0.8000 mg | ORAL_CAPSULE | Freq: Every day | ORAL | 3 refills | Status: AC

## 2024-09-22 NOTE — Telephone Encounter (Signed)
 Spoke with patient via phonecall. Patient had a syncopal episode a week before christmas. He said he was standing at the fridge and next thing he knew his wife was waking him up. She got him up to the couch, his heart rate was 183 and blood pressure was low but he doesn't remember the number. He did not have any pre-syncopal symptoms he states he just started feeling dizzy then passed out. He denies shortness of breath of chest pain and has not had any episodes since then. He is a patient of Dr Perla but has not been seen since 2023. Calcium  score in 2023 noted 4.2 cm borderline aneurysmal thoracic aorta which indicated the need for annual imaging however patient has not been seen or had imaging since last visit.   He does not notice palpitations or a fast heart rate but was noted that he does have them but normally his heart rate is on the lower end.   Pt appt made for Dr Argentina tomorrow 1/8 at 0900 in his DOD slot.

## 2024-09-22 NOTE — Progress Notes (Signed)
 "  09/22/2024 8:41 AM   Jerry [name] 06-27-1956 [id number]  Referring provider: Randeen Laine LABOR, MD [address] [place],  [place] [address]  Chief Complaint  Patient presents with   Follow-up   Urologic history: 1.  Chronic bacterial prostatitis  2.  BPH with LUTS Tamsulosin  0.4 mg  3.  History elevated PSA Secondary to prostatitis   HPI: Jerry [name] is a 69 y.o. male presents for follow-up visit.  Refer to Jerry [name]'s prior note 03/24/2024 for a clinical summary Since his last visit he has had intermittent symptoms.  He has identified dietary triggers including caffeine, chocolate, nitrates and soft drinks Remains on tamsulosin  IPSS today 12/35   PMH: Past Medical History:  Diagnosis Date   Allergic rhinitis, cause unspecified    Asthma    Diabetes mellitus without complication (HCC)    Elbow fracture age 81 or 89   left   History of kidney stones    Hx of colonic polyps    Other and unspecified hyperlipidemia    Sleep apnea    Unspecified essential hypertension     Surgical History: Past Surgical History:  Procedure Laterality Date   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   surgery   CHOLECYSTECTOMY     COLONOSCOPY WITH PROPOFOL  N/A 03/11/2022   Procedure: COLONOSCOPY WITH PROPOFOL ;  Surgeon: Therisa Bi, MD;  Location: [facility] ENDOSCOPY;  Service: Gastroenterology;  Laterality: N/A;   FINGER SURGERY Right 02/2022   FRACTURE SURGERY     GANGLION CYST EXCISION Right 01/28/2022   Procedure: RIGHT SMALL FINGER MUCOUS CYST EXCISION;  Surgeon: Romona Harari, MD;  Location: [facility];  Service: Orthopedics;  Laterality: Right;   KIDNEY STONE SURGERY     left leg fracture     TONSILLECTOMY AND ADENOIDECTOMY     2000    Home Medications:  Allergies as of 09/22/2024       Reactions   Augmentin [amoxicillin -pot Clavulanate] Nausea And Vomiting   Crestor  [rosuvastatin  Calcium ]    Body pain    Fluticasone  Propionate     REACTION: nosebleeds        Medication List        Accurate as of September 22, 2024  8:41 AM. If you have any questions, ask your nurse or doctor.          amLODipine  10 MG tablet Commonly known as: NORVASC  Take 1 tablet (10 mg total) by mouth daily.   ELDERBERRY PO Take 1 tablet by mouth daily.   ezetimibe  10 MG tablet Commonly known as: ZETIA  Take 1 tablet (10 mg total) by mouth daily.   fexofenadine 180 MG tablet Commonly known as: ALLEGRA Take 180 mg by mouth daily.   lisinopril  10 MG tablet Commonly known as: ZESTRIL  TAKE 1 TABLET BY MOUTH EVERY DAY   metFORMIN  500 MG 24 hr tablet Commonly known as: GLUCOPHAGE -XR TAKE 1 TABLET BY MOUTH EVERY DAY WITH BREAKFAST   multivitamin capsule Take 1 capsule by mouth daily.   OVER THE COUNTER MEDICATION Take 1 tablet by mouth daily. Nugenix   QC TUMERIC COMPLEX PO Take by mouth.   tamsulosin  0.4 MG Caps capsule Commonly known as: FLOMAX  Take 2 capsules (0.8 mg total) by mouth daily.   vitamin C 100 MG tablet Take 100 mg by mouth daily.   ZINC PO Take 1 tablet by mouth daily.        Allergies: Allergies[1]  Family History: Family History  Problem Relation Age of Onset  Lung cancer Father    Diabetes Mother    Pancreatic cancer Paternal Grandfather    Prostate cancer Other    Colon cancer Neg Hx     Social History:  reports that he quit smoking about 52 years ago. His smoking use included cigarettes. He started smoking about 55 years ago. He has a 1.5 pack-year smoking history. He has never used smokeless tobacco. He reports that he does not drink alcohol and does not use drugs.   Physical Exam: BP 133/71   Pulse (!) 51   Ht 5' 9.5 (1.765 m)   Wt 180 lb (81.6 kg)   BMI 26.20 kg/m   Constitutional:  Alert, No acute distress. HEENT: [place] AT Respiratory: Normal respiratory effort, no increased work of breathing. Psychiatric: Normal mood and affect.  Laboratory  Data:  Urinalysis Dipstick/microscopy negative  Assessment & Plan:    1.  BPH with LUTS Stable on tamsulosin  which was refilled Continue annual follow-up  2.  Chronic prostatitis History of bacterial prostatitis; has identified dietary triggers Follow-up as needed for prostatitis symptoms  1.  History elevated PSA  PSA drawn today   Jerry JAYSON Barba, MD  [facility] [address], [address] [place], [place] [address] [phone]    [1]  Allergies Allergen Reactions   Augmentin [Amoxicillin -Pot Clavulanate] Nausea And Vomiting   Crestor  [Rosuvastatin  Calcium ]     Body pain    Fluticasone  Propionate     REACTION: nosebleeds   "

## 2024-09-22 NOTE — Telephone Encounter (Signed)
 Patient c/o Palpitations:  STAT if patient reporting lightheadedness, shortness of breath, or chest pain  How long have you had palpitations/irregular HR/ Afib? Are you having the symptoms now? Irregular HR, no   Are you currently experiencing lightheadedness, SOB or CP? No   Do you have a history of afib (atrial fibrillation) or irregular heart rhythm? No   Have you checked your BP or HR? (document readings if available): 183  Are you experiencing any other symptoms? No   Please advise

## 2024-09-22 NOTE — Addendum Note (Signed)
 Addended by: DEBBY LAYMON HERO on: 09/22/2024 08:46 AM   Modules accepted: Orders

## 2024-09-23 ENCOUNTER — Ambulatory Visit

## 2024-09-23 ENCOUNTER — Other Ambulatory Visit: Payer: Self-pay

## 2024-09-23 VITALS — BP 112/78 | HR 49 | Ht 69.0 in | Wt 185.0 lb

## 2024-09-23 DIAGNOSIS — I2510 Atherosclerotic heart disease of native coronary artery without angina pectoris: Secondary | ICD-10-CM

## 2024-09-23 DIAGNOSIS — R55 Syncope and collapse: Secondary | ICD-10-CM

## 2024-09-23 DIAGNOSIS — R001 Bradycardia, unspecified: Secondary | ICD-10-CM | POA: Diagnosis not present

## 2024-09-23 DIAGNOSIS — I700 Atherosclerosis of aorta: Secondary | ICD-10-CM | POA: Diagnosis not present

## 2024-09-23 DIAGNOSIS — I7121 Aneurysm of the ascending aorta, without rupture: Secondary | ICD-10-CM

## 2024-09-23 DIAGNOSIS — I479 Paroxysmal tachycardia, unspecified: Secondary | ICD-10-CM

## 2024-09-23 DIAGNOSIS — E782 Mixed hyperlipidemia: Secondary | ICD-10-CM

## 2024-09-23 LAB — PSA: Prostate Specific Ag, Serum: 3.1 ng/mL (ref 0.0–4.0)

## 2024-09-23 MED ORDER — METFORMIN HCL ER 500 MG PO TB24: 500.0000 mg | ORAL_TABLET | Freq: Every day | ORAL | 1 refills | Status: AC

## 2024-09-23 MED ORDER — AMLODIPINE BESYLATE 10 MG PO TABS: 10.0000 mg | ORAL_TABLET | Freq: Every day | ORAL | 1 refills | Status: AC

## 2024-09-23 MED ORDER — LISINOPRIL 10 MG PO TABS: 10.0000 mg | ORAL_TABLET | Freq: Every day | ORAL | 1 refills | Status: AC

## 2024-09-23 MED ORDER — ASPIRIN 81 MG PO TBEC: 81.0000 mg | DELAYED_RELEASE_TABLET | Freq: Every day | ORAL | Status: AC

## 2024-09-23 NOTE — Patient Instructions (Signed)
 Medication Instructions:  Your physician recommends the following medication changes.  START TAKING: Aspirin  81 mg once daily  Continue all other medications as prescribed  *If you need a refill on your cardiac medications before your next appointment, please call your pharmacy*  Lab Work: No labs ordered today  If you have labs (blood work) drawn today and your tests are completely normal, you will receive your results only by: MyChart Message (if you have MyChart) OR A paper copy in the mail If you have any lab test that is abnormal or we need to change your treatment, we will call you to review the results.  Testing/Procedures:  ECHOCARDIOGRAM:  Your physician has requested that you have an echocardiogram. Echocardiography is a painless test that uses sound waves to create images of your heart. It provides your doctor with information about the size and shape of your heart and how well your hearts chambers and valves are working.   You may receive an ultrasound enhancing agent through an IV if needed to better visualize your heart during the echo. This procedure takes approximately one hour.  There are no restrictions for this procedure.  This will take place at [address] [facility]) [address], [place] [address]  Please note: We ask at that you not bring children with you during ultrasound (echo/ vascular) testing. Due to room size and safety concerns, children are not allowed in the ultrasound rooms during exams. Our front office staff cannot provide observation of children in our lobby area while testing is being conducted. An adult accompanying a patient to their appointment will only be allowed in the ultrasound room at the discretion of the ultrasound technician under special circumstances. We apologize for any inconvenience.   EXERCISE TOLERANCE TEST  Your provider has ordered a exercise tolerance test. This test will evaluate the blood supply to your heart  muscle during periods of exercise and rest. For this test, you will raise your heart rate by walking on a treadmill at different levels.   you may eat a light breakfast/ lunch prior to your procedure no caffeine for 24 hours prior to your test (coffee, tea, soft drinks, or chocolate)  no smoking/ vaping for 4 hours prior to your test you may take your regular medications the day of your test except for:   - hold any beta blockers (such as metoprolol, carvedilol, nebivolol, propanolol) bring any inhalers with you to your test wear comfortable clothing & tennis/ non-skid shoes to walk on the treadmill  This will take place at [address] [facility])  [place] [phone]   GEOFFRY HEWS- Long Term Monitor Instructions  Your physician has requested you wear a ZIO patch monitor for 14 days.  This is a single patch monitor. [employer] supplies one patch monitor per enrollment. Additional stickers are not available. Please do not apply patch if you will be having a Nuclear Stress Test, Echocardiogram, Cardiac CT, MRI, or Chest Xray during the period you would be wearing the monitor. The patch cannot be worn during these tests. You cannot remove and re-apply the ZIO XT patch monitor.  Your ZIO patch monitor will be mailed 3 day USPS to your address on file. It may take 3-5 days to receive your monitor after you have been enrolled. Once you have received your monitor, please review the enclosed instructions. Your monitor has already been registered assigning a specific monitor serial number to you.  Billing and Patient Assistance Program Information  We have supplied [employer] with any of your insurance information on file for billing purposes.  [employer] offers a sliding scale Patient Assistance Program for patients that do not have insurance, or whose insurance does not completely cover the cost of the ZIO monitor.  You must apply for the Patient Assistance Program to qualify for this discounted  rate.  To apply, please call [employer] at [phone], select option 4, select option 2, ask to apply for Patient Assistance Program. Meredeth will ask your household income, and how many people are in your household. They will quote your out-of-pocket cost based on that information. [employer] will also be able to set up a 75-month, interest-free payment plan if needed.  Applying the monitor   Shave hair from upper left chest.  Hold abrader disc by orange tab. Rub abrader in 40 strokes over the upper left chest as indicated in your monitor instructions.  Clean area with 4 enclosed alcohol pads. Let dry.  Apply patch as indicated in monitor instructions. Patch will be placed under collarbone on left side of chest with arrow pointing upward.  Rub patch adhesive wings for 2 minutes. Remove white label marked 1. Remove the white label marked 2. Rub patch adhesive wings for 2 additional minutes.  While looking in a mirror, press and release button in center of patch. A small green light will flash 3-4 times. This will be your only indicator that the monitor has been turned on.   After Applying Monitor: Do not shower for the first 24 hours. You may shower after the first 24 hours; however you must keep your back toward the water .  No direct spray on monitor. Press the button if you feel a symptom. You will hear a small click. Record Date, Time and Symptom in the Patient Logbook.   After Completing 14 Days: When you are ready to remove the patch, follow instructions on the last 2 pages of Patient Logbook.  Stick patch monitor into the tabs at the bottom of the return box.  Place Patient Logbook in the blue and white box. Use locking tab on box and tape box closed securely. The blue and white box has prepaid postage on it. Please place it in the mailbox as soon as possible. Your physician should have your test results approximately 7-14 days after the monitor has been mailed back to [facility].    Troubleshooting: Call [facility] at [phone] if you have questions regarding your ZIO XT patch monitor.  Call them immediately if you see an orange light blinking on your monitor.  If your monitor falls off in less than 4 days, contact our Monitor department at [phone].  If your monitor becomes loose or falls off after 4 days call [employer] at [phone] for suggestions on securing your monitor.   Follow-Up: At [facility], you and your health needs are our priority.  As part of our continuing mission to provide you with exceptional heart care, our providers are all part of one team.  This team includes your primary Cardiologist (physician) and Advanced Practice Providers or APPs (Physician Assistants and Nurse Practitioners) who all work together to provide you with the care you need, when you need it.  Your next appointment:  2 month(s)  Provider:  Timothy Gollan, MD   We recommend signing up for the patient portal called MyChart.  Sign up information is provided on this After Visit Summary.  MyChart is used to connect with patients for Virtual Visits (Telemedicine).  Patients are able to view lab/test results, encounter notes, upcoming appointments, etc.  Non-urgent messages can be sent to your provider as well.   To learn more about what you can do with MyChart, go to [email or web address].

## 2024-09-23 NOTE — Progress Notes (Signed)
 " Cardiology Office Note   Date:  09/23/2024  ID:  Jerry [name], Jerry [name] 19-Dec-1955, MRN [id number] PCP: Randeen Laine LABOR, MD   [facility] Cardiologist:  Evalene Lunger, MD     History of Present Illness Jerry [name] is a 69 y.o. male PMH HTN, DM2, HLD who presents for further evaluation management of syncope.  Patient has previously followed with Dr. Gollan.  Last seen 02/2022.  Reports a syncopal episode a week before Christmas without prodrome.  Was reportedly tachycardic and hypotensive upon awakening.  Last LDL 81 11/2023.  He also notes prior sensations that his heart did not keep up with activity.  He says this has not really been happening recently.  He denies any persistent fatigue or any other abnormalities aside from this recent syncopal episode.  Relevant CVD History -CAC score 383 with three-vessel involvement 02/2022.  Aortic atherosclerosis.  4.2 centimeter TAA.   ROS: Pt denies any chest discomfort, jaw pain, arm pain, orthopnea, PND, or LE edema.  Studies Reviewed I have independently reviewed the patient's ECG, previous medical records, previous blood work, previous CT scan.  Physical Exam VS:  BP 112/78 (BP Location: Left Arm, Patient Position: Sitting, Cuff Size: Normal)   Pulse (!) 49   Ht 5' 9 (1.753 m)   Wt 185 lb (83.9 kg)   SpO2 97%   BMI 27.32 kg/m   Orthostatic VS for the past 24 hrs (Last 3 readings):  BP- Lying Pulse- Lying BP- Sitting Pulse- Sitting BP- Standing at 0 minutes Pulse- Standing at 0 minutes BP- Standing at 3 minutes Pulse- Standing at 3 minutes  09/23/24 0914 152/74 (!) 47 125/54 (!) 49 138/71 52 153/79 52      Wt Readings from Last 3 Encounters:  09/23/24 185 lb (83.9 kg)  09/22/24 180 lb (81.6 kg)  06/14/24 185 lb (83.9 kg)    GEN: No acute distress. NECK: No JVD; No carotid bruits. CARDIAC: RRR, no murmurs, rubs, gallops. RESPIRATORY:  Clear to auscultation. EXTREMITIES:  Warm and well-perfused. No  edema.  ASSESSMENT AND PLAN Syncope Sinus bradycardia Paroxysmal tachycardia Patient presents for further evaluation of an isolated syncopal episode.  This happened 1 week before Christmas.  He did not have any prodrome.  He has baseline sinus bradycardia with first-degree AV block.  He also reports that he was tachycardic and hypotensive upon recovery based on home measurements.  This issue remains undifferentiated.  Given his baseline sinus bradycardia, I do have concerns for high risk bradycardia arrhythmias or perhaps chronotropic incompetence given prior description of feeling fatigued as though his heart rate could not keep up with activity.  Plan: - Echocardiogram to evaluate for structural causes - Monitor to evaluate for high risk bradycardia or tachyarrhythmias - ETT to evaluate for chronotropic incompetence - Further plans pending results  TAA 4.2 cm mildly dilated ascending aorta seen on CAC score from 2023.  As above, will obtain an echocardiogram.  If there appears to be significant progression from prior, then we will obtain a CT to more accurately evaluate.  CAC Aortic atherosclerosis HLD with statin intolerance Seen on prior CT imaging 2023.  Last LDL 81 11/2023. No angina.  Plan: - Start ASA 81 mg daily - Continue Zetia  10 mg daily for now; LDL goal less than 70.  He will likely need Repatha or a low intensity statin or other alternative to get him to goal.  Discussed that we will defer this for now given his other more pertinent  issues.     Informed Consent   The risks [chest pain, shortness of breath, cardiac arrhythmias, dizziness, blood pressure fluctuations, myocardial infarction, stroke/transient ischemic attack, and life-threatening complications (estimated to be 1 in 10,000)], benefits (risk stratification, diagnosing coronary artery disease, treatment guidance) and alternatives of an exercise tolerance test were discussed in detail with Jerry [name] and he agrees  to proceed.     Dispo: RTC approximately 2 months with Dr. Perla  Signed, Caron Poser, MD  "

## 2024-09-24 ENCOUNTER — Ambulatory Visit: Payer: Self-pay | Admitting: Urology

## 2024-10-04 ENCOUNTER — Other Ambulatory Visit: Payer: Self-pay

## 2024-10-08 MED ORDER — EZETIMIBE 10 MG PO TABS: 10.0000 mg | ORAL_TABLET | Freq: Every day | ORAL | 1 refills | Status: AC

## 2024-10-08 NOTE — Telephone Encounter (Signed)
 Refill request

## 2024-10-14 ENCOUNTER — Ambulatory Visit: Payer: Self-pay

## 2024-10-14 DIAGNOSIS — R55 Syncope and collapse: Secondary | ICD-10-CM

## 2024-10-14 DIAGNOSIS — R001 Bradycardia, unspecified: Secondary | ICD-10-CM | POA: Diagnosis not present

## 2024-10-18 ENCOUNTER — Ambulatory Visit

## 2024-10-18 DIAGNOSIS — R001 Bradycardia, unspecified: Secondary | ICD-10-CM | POA: Diagnosis not present

## 2024-10-18 DIAGNOSIS — R55 Syncope and collapse: Secondary | ICD-10-CM

## 2024-10-18 LAB — ECHOCARDIOGRAM COMPLETE
AR max vel: 2.75 cm2
AV Area VTI: 2.78 cm2
AV Area mean vel: 2.7 cm2
AV Mean grad: 5 mmHg
AV Peak grad: 8.5 mmHg
Ao pk vel: 1.46 m/s
Area-P 1/2: 2.17 cm2
MV M vel: 4.88 m/s
MV Peak grad: 95.3 mmHg
MV VTI: 1.52 cm2
S' Lateral: 2.9 cm

## 2024-10-19 ENCOUNTER — Inpatient Hospital Stay: Admission: RE | Admit: 2024-10-19 | Discharge: 2024-10-19 | Disposition: A | Source: Ambulatory Visit

## 2024-10-19 DIAGNOSIS — R55 Syncope and collapse: Secondary | ICD-10-CM | POA: Insufficient documentation

## 2024-10-19 DIAGNOSIS — R001 Bradycardia, unspecified: Secondary | ICD-10-CM | POA: Insufficient documentation

## 2024-10-20 LAB — EXERCISE TOLERANCE TEST
Angina Index: 0
Duke Treadmill Score: 9
Estimated workload: 10.5
Exercise duration (min): 9 min
Exercise duration (sec): 19 s
MPHR: 151 {beats}/min
Peak HR: 151 {beats}/min
Percent HR: 95 %
Rest HR: 52 {beats}/min
ST Depression (mm): 0 mm

## 2024-11-22 ENCOUNTER — Ambulatory Visit: Admitting: Cardiovascular Disease

## 2024-12-17 ENCOUNTER — Other Ambulatory Visit

## 2024-12-24 ENCOUNTER — Encounter: Admitting: Family Medicine

## 2025-05-02 ENCOUNTER — Ambulatory Visit

## 2025-09-21 ENCOUNTER — Ambulatory Visit: Admitting: Urology
